# Patient Record
Sex: Male | Born: 1955
Health system: Southern US, Community
[De-identification: ages and names within clinical notes are randomized; demographics above are authoritative.]

## PROBLEM LIST (undated history)

## (undated) DIAGNOSIS — I255 Ischemic cardiomyopathy: Secondary | ICD-10-CM

## (undated) DIAGNOSIS — I2510 Atherosclerotic heart disease of native coronary artery without angina pectoris: Secondary | ICD-10-CM

## (undated) DIAGNOSIS — G971 Other reaction to spinal and lumbar puncture: Secondary | ICD-10-CM

## (undated) DIAGNOSIS — K219 Gastro-esophageal reflux disease without esophagitis: Secondary | ICD-10-CM

## (undated) DIAGNOSIS — E785 Hyperlipidemia, unspecified: Secondary | ICD-10-CM

## (undated) DIAGNOSIS — Z95810 Presence of automatic (implantable) cardiac defibrillator: Secondary | ICD-10-CM

## (undated) DIAGNOSIS — T827XXA Infection and inflammatory reaction due to other cardiac and vascular devices, implants and grafts, initial encounter: Secondary | ICD-10-CM

## (undated) DIAGNOSIS — Z9889 Other specified postprocedural states: Secondary | ICD-10-CM

## (undated) DIAGNOSIS — IMO0001 Reserved for inherently not codable concepts without codable children: Secondary | ICD-10-CM

## (undated) DIAGNOSIS — R112 Nausea with vomiting, unspecified: Secondary | ICD-10-CM

## (undated) DIAGNOSIS — F419 Anxiety disorder, unspecified: Secondary | ICD-10-CM

## (undated) DIAGNOSIS — R51 Headache: Secondary | ICD-10-CM

## (undated) DIAGNOSIS — I679 Cerebrovascular disease, unspecified: Secondary | ICD-10-CM

## (undated) DIAGNOSIS — I519 Heart disease, unspecified: Secondary | ICD-10-CM

## (undated) DIAGNOSIS — I739 Peripheral vascular disease, unspecified: Secondary | ICD-10-CM

## (undated) DIAGNOSIS — I701 Atherosclerosis of renal artery: Secondary | ICD-10-CM

## (undated) DIAGNOSIS — I10 Essential (primary) hypertension: Secondary | ICD-10-CM

## (undated) DIAGNOSIS — I447 Left bundle-branch block, unspecified: Secondary | ICD-10-CM

## (undated) DIAGNOSIS — I462 Cardiac arrest due to underlying cardiac condition: Secondary | ICD-10-CM

## (undated) HISTORY — DX: Cardiac arrest due to underlying cardiac condition: I46.2

## (undated) HISTORY — DX: Ischemic cardiomyopathy: I25.5

## (undated) HISTORY — DX: Heart disease, unspecified: I51.9

## (undated) HISTORY — DX: Cerebrovascular disease, unspecified: I67.9

## (undated) HISTORY — DX: Atherosclerosis of renal artery: I70.1

## (undated) HISTORY — DX: Essential (primary) hypertension: I10

## (undated) HISTORY — DX: Hyperlipidemia, unspecified: E78.5

## (undated) HISTORY — DX: Left bundle-branch block, unspecified: I44.7

## (undated) HISTORY — DX: Presence of automatic (implantable) cardiac defibrillator: Z95.810

## (undated) HISTORY — PX: PERCUTANEOUS PLACEMENT INTRAVASCULAR STENT CERVICAL CAROTID ARTERY: SUR1019

## (undated) HISTORY — DX: Atherosclerotic heart disease of native coronary artery without angina pectoris: I25.10

## (undated) HISTORY — DX: Infection and inflammatory reaction due to other cardiac and vascular devices, implants and grafts, initial encounter: T82.7XXA

## (undated) HISTORY — DX: Peripheral vascular disease, unspecified: I73.9

---

## 2000-01-04 ENCOUNTER — Encounter: Payer: Self-pay | Admitting: Emergency Medicine

## 2000-01-04 ENCOUNTER — Emergency Department (HOSPITAL_COMMUNITY): Admission: EM | Admit: 2000-01-04 | Discharge: 2000-01-04 | Payer: Self-pay | Admitting: Emergency Medicine

## 2004-12-11 ENCOUNTER — Ambulatory Visit: Payer: Self-pay | Admitting: Family Medicine

## 2004-12-25 ENCOUNTER — Ambulatory Visit: Payer: Self-pay | Admitting: Family Medicine

## 2005-01-09 ENCOUNTER — Ambulatory Visit: Payer: Self-pay | Admitting: Internal Medicine

## 2005-01-16 ENCOUNTER — Ambulatory Visit (HOSPITAL_COMMUNITY): Admission: RE | Admit: 2005-01-16 | Discharge: 2005-01-16 | Payer: Self-pay | Admitting: Internal Medicine

## 2005-01-16 ENCOUNTER — Ambulatory Visit: Payer: Self-pay | Admitting: Internal Medicine

## 2005-02-13 ENCOUNTER — Ambulatory Visit: Payer: Self-pay | Admitting: Family Medicine

## 2012-06-08 DIAGNOSIS — I2510 Atherosclerotic heart disease of native coronary artery without angina pectoris: Secondary | ICD-10-CM

## 2012-06-08 HISTORY — DX: Atherosclerotic heart disease of native coronary artery without angina pectoris: I25.10

## 2012-06-11 ENCOUNTER — Encounter (HOSPITAL_COMMUNITY): Payer: Self-pay | Admitting: Family Medicine

## 2012-06-11 ENCOUNTER — Emergency Department (HOSPITAL_COMMUNITY): Payer: BC Managed Care – PPO | Admitting: Anesthesiology

## 2012-06-11 ENCOUNTER — Inpatient Hospital Stay (HOSPITAL_COMMUNITY)
Admission: EM | Admit: 2012-06-11 | Discharge: 2012-06-18 | DRG: 546 | Disposition: A | Discharge status: Home / Self Care | Payer: BC Managed Care – PPO | Attending: Cardiothoracic Surgery | Admitting: Cardiothoracic Surgery

## 2012-06-11 ENCOUNTER — Emergency Department (HOSPITAL_COMMUNITY): Payer: BC Managed Care – PPO

## 2012-06-11 ENCOUNTER — Inpatient Hospital Stay (HOSPITAL_COMMUNITY): Payer: BC Managed Care – PPO

## 2012-06-11 ENCOUNTER — Encounter (HOSPITAL_COMMUNITY)
Admission: EM | Disposition: A | Discharge status: Home / Self Care | Payer: Self-pay | Source: Home / Self Care | Attending: Cardiothoracic Surgery

## 2012-06-11 ENCOUNTER — Encounter (HOSPITAL_COMMUNITY): Payer: Self-pay | Admitting: Anesthesiology

## 2012-06-11 DIAGNOSIS — I213 ST elevation (STEMI) myocardial infarction of unspecified site: Secondary | ICD-10-CM | POA: Diagnosis present

## 2012-06-11 DIAGNOSIS — I2510 Atherosclerotic heart disease of native coronary artery without angina pectoris: Secondary | ICD-10-CM | POA: Diagnosis present

## 2012-06-11 DIAGNOSIS — I708 Atherosclerosis of other arteries: Secondary | ICD-10-CM | POA: Diagnosis present

## 2012-06-11 DIAGNOSIS — I2584 Coronary atherosclerosis due to calcified coronary lesion: Secondary | ICD-10-CM | POA: Diagnosis present

## 2012-06-11 DIAGNOSIS — I469 Cardiac arrest, cause unspecified: Secondary | ICD-10-CM | POA: Diagnosis present

## 2012-06-11 DIAGNOSIS — E872 Acidosis, unspecified: Secondary | ICD-10-CM | POA: Diagnosis not present

## 2012-06-11 DIAGNOSIS — I454 Nonspecific intraventricular block: Secondary | ICD-10-CM | POA: Diagnosis present

## 2012-06-11 DIAGNOSIS — Z23 Encounter for immunization: Secondary | ICD-10-CM

## 2012-06-11 DIAGNOSIS — I739 Peripheral vascular disease, unspecified: Secondary | ICD-10-CM | POA: Diagnosis present

## 2012-06-11 DIAGNOSIS — D62 Acute posthemorrhagic anemia: Secondary | ICD-10-CM | POA: Diagnosis not present

## 2012-06-11 DIAGNOSIS — I4901 Ventricular fibrillation: Secondary | ICD-10-CM | POA: Diagnosis present

## 2012-06-11 DIAGNOSIS — Z8674 Personal history of sudden cardiac arrest: Secondary | ICD-10-CM | POA: Diagnosis present

## 2012-06-11 DIAGNOSIS — I701 Atherosclerosis of renal artery: Secondary | ICD-10-CM | POA: Diagnosis present

## 2012-06-11 DIAGNOSIS — R570 Cardiogenic shock: Secondary | ICD-10-CM | POA: Diagnosis present

## 2012-06-11 DIAGNOSIS — E876 Hypokalemia: Secondary | ICD-10-CM | POA: Diagnosis not present

## 2012-06-11 DIAGNOSIS — I2582 Chronic total occlusion of coronary artery: Secondary | ICD-10-CM | POA: Diagnosis present

## 2012-06-11 DIAGNOSIS — Z951 Presence of aortocoronary bypass graft: Secondary | ICD-10-CM

## 2012-06-11 DIAGNOSIS — I4949 Other premature depolarization: Secondary | ICD-10-CM | POA: Diagnosis present

## 2012-06-11 DIAGNOSIS — I219 Acute myocardial infarction, unspecified: Principal | ICD-10-CM | POA: Diagnosis present

## 2012-06-11 DIAGNOSIS — E785 Hyperlipidemia, unspecified: Secondary | ICD-10-CM | POA: Diagnosis present

## 2012-06-11 DIAGNOSIS — I255 Ischemic cardiomyopathy: Secondary | ICD-10-CM | POA: Diagnosis present

## 2012-06-11 DIAGNOSIS — F17200 Nicotine dependence, unspecified, uncomplicated: Secondary | ICD-10-CM | POA: Diagnosis present

## 2012-06-11 DIAGNOSIS — I2589 Other forms of chronic ischemic heart disease: Secondary | ICD-10-CM | POA: Diagnosis present

## 2012-06-11 DIAGNOSIS — Z720 Tobacco use: Secondary | ICD-10-CM | POA: Diagnosis present

## 2012-06-11 DIAGNOSIS — E8779 Other fluid overload: Secondary | ICD-10-CM | POA: Diagnosis not present

## 2012-06-11 HISTORY — PX: CORONARY ARTERY BYPASS GRAFT: SHX141

## 2012-06-11 HISTORY — PX: LEFT HEART CATHETERIZATION WITH CORONARY ANGIOGRAM: SHX5451

## 2012-06-11 LAB — POCT I-STAT 4, (NA,K, GLUC, HGB,HCT)
Glucose, Bld: 139 mg/dL — ABNORMAL HIGH (ref 70–99)
Glucose, Bld: 125 mg/dL — ABNORMAL HIGH (ref 70–99)
HCT: 40 % (ref 39.0–52.0)
HCT: 26 % — ABNORMAL LOW (ref 39.0–52.0)
HCT: 32 % — ABNORMAL LOW (ref 39.0–52.0)
Hemoglobin: 13.6 g/dL (ref 13.0–17.0)
Hemoglobin: 8.8 g/dL — ABNORMAL LOW (ref 13.0–17.0)
Hemoglobin: 8.8 g/dL — ABNORMAL LOW (ref 13.0–17.0)
Potassium: 4 mEq/L (ref 3.5–5.1)
Potassium: 4.2 mEq/L (ref 3.5–5.1)
Potassium: 3.6 mEq/L (ref 3.5–5.1)
Potassium: 3.6 mEq/L (ref 3.5–5.1)
Sodium: 143 mEq/L (ref 135–145)
Sodium: 145 mEq/L (ref 135–145)
Sodium: 141 mEq/L (ref 135–145)
Sodium: 141 mEq/L (ref 135–145)

## 2012-06-11 LAB — POCT I-STAT TROPONIN I

## 2012-06-11 LAB — HEMOGLOBIN AND HEMATOCRIT, BLOOD
HCT: 25.8 % — ABNORMAL LOW (ref 39.0–52.0)
Hemoglobin: 9.5 g/dL — ABNORMAL LOW (ref 13.0–17.0)

## 2012-06-11 LAB — COMPREHENSIVE METABOLIC PANEL
ALT: 98 U/L — ABNORMAL HIGH (ref 0–53)
AST: 102 U/L — ABNORMAL HIGH (ref 0–37)
Alkaline Phosphatase: 90 U/L (ref 39–117)
CO2: 12 mEq/L — ABNORMAL LOW (ref 19–32)
GFR calc Af Amer: >90 mL/min (ref >90)
GFR calc non Af Amer: 78 mL/min — ABNORMAL LOW (ref >90)
Glucose, Bld: 374 mg/dL — ABNORMAL HIGH (ref 70–99)
Potassium: 3.3 mEq/L — ABNORMAL LOW (ref 3.5–5.1)
Sodium: 139 mEq/L (ref 135–145)

## 2012-06-11 LAB — CBC
Hemoglobin: 14.3 g/dL (ref 13.0–17.0)
Platelets: 244 10*3/uL (ref 150–400)
Platelets: 232 10*3/uL (ref 150–400)
RBC: 4.74 MIL/uL (ref 4.22–5.81)
RBC: 3.57 MIL/uL — ABNORMAL LOW (ref 4.22–5.81)
RDW: 12.5 % (ref 11.5–15.5)
WBC: 13.4 10*3/uL — ABNORMAL HIGH (ref 4.0–10.5)
WBC: 25.2 10*3/uL — ABNORMAL HIGH (ref 4.0–10.5)

## 2012-06-11 LAB — POCT I-STAT 3, ART BLOOD GAS (G3+)
Acid-base deficit: 4 mmol/L — ABNORMAL HIGH (ref 0.0–2.0)
Acid-base deficit: 1 mmol/L (ref 0.0–2.0)
Acid-base deficit: 2 mmol/L (ref 0.0–2.0)
Acid-base deficit: 2 mmol/L (ref 0.0–2.0)
Acid-base deficit: 1 mmol/L (ref 0.0–2.0)
Bicarbonate: 24.9 mEq/L — ABNORMAL HIGH (ref 20.0–24.0)
Bicarbonate: 25.9 mEq/L — ABNORMAL HIGH (ref 20.0–24.0)
Bicarbonate: 25.2 mEq/L — ABNORMAL HIGH (ref 20.0–24.0)
Bicarbonate: 25.4 mEq/L — ABNORMAL HIGH (ref 20.0–24.0)
O2 Saturation: 99 %
O2 Saturation: 100 %
O2 Saturation: 68 %
O2 Saturation: 95 %
O2 Saturation: 95 %
Patient temperature: 35.1
Patient temperature: 36.5
TCO2: 16 mmol/L (ref 0–100)
TCO2: 27 mmol/L (ref 0–100)
TCO2: 27 mmol/L (ref 0–100)
pCO2 arterial: 55.9 mmHg — ABNORMAL HIGH (ref 35.0–45.0)
pCO2 arterial: 52.2 mmHg — ABNORMAL HIGH (ref 35.0–45.0)
pH, Arterial: 7.344 — ABNORMAL LOW (ref 7.350–7.450)
pH, Arterial: 7.291 — ABNORMAL LOW (ref 7.350–7.450)
pO2, Arterial: 115 mmHg — ABNORMAL HIGH (ref 80.0–100.0)
pO2, Arterial: 332 mmHg — ABNORMAL HIGH (ref 80.0–100.0)
pO2, Arterial: 76 mmHg — ABNORMAL LOW (ref 80.0–100.0)

## 2012-06-11 LAB — POCT I-STAT, CHEM 8
BUN: 9 mg/dL (ref 6–23)
Creatinine, Ser: 1.2 mg/dL (ref 0.50–1.35)
Glucose, Bld: 356 mg/dL — ABNORMAL HIGH (ref 70–99)
Hemoglobin: 15 g/dL (ref 13.0–17.0)
Potassium: 3.3 mEq/L — ABNORMAL LOW (ref 3.5–5.1)
Sodium: 141 mEq/L (ref 135–145)

## 2012-06-11 LAB — ABO/RH: ABO/RH(D): O POS

## 2012-06-11 LAB — PROTIME-INR
INR: 1.53 — ABNORMAL HIGH (ref 0.00–1.49)
Prothrombin Time: 18.7 seconds — ABNORMAL HIGH (ref 11.6–15.2)

## 2012-06-11 LAB — MRSA PCR SCREENING: MRSA by PCR: NEGATIVE

## 2012-06-11 LAB — APTT
aPTT: 51 seconds — ABNORMAL HIGH (ref 24–37)
aPTT: 40 seconds — ABNORMAL HIGH (ref 24–37)

## 2012-06-11 LAB — PLATELET COUNT: Platelets: 168 10*3/uL (ref 150–400)

## 2012-06-11 SURGERY — LEFT HEART CATHETERIZATION WITH CORONARY ANGIOGRAM
Anesthesia: LOCAL

## 2012-06-11 SURGERY — CORONARY ARTERY BYPASS GRAFTING (CABG)
Anesthesia: General | Site: Chest | Wound class: Clean

## 2012-06-11 MED ORDER — PHENYLEPHRINE HCL 10 MG/ML IJ SOLN
0.0000 ug/min | INTRAVENOUS | Status: DC
  Filled 2012-06-11 (×4): qty 2

## 2012-06-11 MED ORDER — CHLORHEXIDINE GLUCONATE 0.12 % MT SOLN
OROMUCOSAL | Status: AC
  Administered 2012-06-11: 15 mL
  Filled 2012-06-11: qty 15

## 2012-06-11 MED ORDER — LORAZEPAM 2 MG/ML IJ SOLN
2.0000 mg | Freq: Once | INTRAMUSCULAR | Status: AC
  Administered 2012-06-11: 2 mg via INTRAVENOUS

## 2012-06-11 MED ORDER — MIDAZOLAM HCL 2 MG/2ML IJ SOLN
INTRAMUSCULAR | Status: AC
  Filled 2012-06-11: qty 2

## 2012-06-11 MED ORDER — 0.9 % SODIUM CHLORIDE (POUR BTL) OPTIME
TOPICAL | Status: DC | PRN
  Administered 2012-06-11: 1000 mL
  Administered 2012-06-11: 4000 mL

## 2012-06-11 MED ORDER — SODIUM CHLORIDE 0.9 % IV SOLN
100.0000 [IU] | INTRAVENOUS | Status: DC | PRN
  Administered 2012-06-11: 1 [IU]/h via INTRAVENOUS

## 2012-06-11 MED ORDER — DEXMEDETOMIDINE HCL IN NACL 400 MCG/100ML IV SOLN
0.1000 ug/kg/h | INTRAVENOUS | Status: DC
  Filled 2012-06-11: qty 100

## 2012-06-11 MED ORDER — LIDOCAINE IN D5W 4-5 MG/ML-% IV SOLN
1.0000 mg/min | INTRAVENOUS | Status: DC
  Filled 2012-06-11 (×2): qty 500

## 2012-06-11 MED ORDER — HEMOSTATIC AGENTS (NO CHARGE) OPTIME
TOPICAL | Status: DC | PRN
  Administered 2012-06-11: 1 via TOPICAL

## 2012-06-11 MED ORDER — CEFUROXIME SODIUM 1.5 G IJ SOLR
1.5000 g | INTRAMUSCULAR | Status: DC
  Filled 2012-06-11: qty 1.5

## 2012-06-11 MED ORDER — BISACODYL 10 MG RE SUPP: 10.0000 mg | Freq: Every day | RECTAL | Status: DC

## 2012-06-11 MED ORDER — SODIUM CHLORIDE 0.45 % IV SOLN
INTRAVENOUS | Status: DC
  Administered 2012-06-13: 02:00:00 via INTRAVENOUS

## 2012-06-11 MED ORDER — MORPHINE SULFATE 2 MG/ML IJ SOLN
2.0000 mg | INTRAMUSCULAR | Status: DC | PRN
  Administered 2012-06-11: 2 mg via INTRAVENOUS
  Administered 2012-06-12: 4 mg via INTRAVENOUS
  Administered 2012-06-12 (×2): 2 mg via INTRAVENOUS
  Administered 2012-06-12: 4 mg via INTRAVENOUS
  Administered 2012-06-12 (×2): 2 mg via INTRAVENOUS
  Administered 2012-06-12: 4 mg via INTRAVENOUS
  Administered 2012-06-12 – 2012-06-13 (×4): 2 mg via INTRAVENOUS
  Administered 2012-06-13: 4 mg via INTRAVENOUS
  Administered 2012-06-13 – 2012-06-14 (×5): 2 mg via INTRAVENOUS
  Administered 2012-06-14 (×2): 4 mg via INTRAVENOUS
  Filled 2012-06-11: qty 2
  Filled 2012-06-11 (×4): qty 1
  Filled 2012-06-11: qty 2
  Filled 2012-06-11: qty 1
  Filled 2012-06-11: qty 2
  Filled 2012-06-11 (×3): qty 1
  Filled 2012-06-11 (×2): qty 2
  Filled 2012-06-11 (×3): qty 1
  Filled 2012-06-11 (×2): qty 2
  Filled 2012-06-11: qty 1

## 2012-06-11 MED ORDER — ACETAMINOPHEN 160 MG/5ML PO SOLN
650.0000 mg | ORAL | Status: AC
  Administered 2012-06-11: 650 mg

## 2012-06-11 MED ORDER — SODIUM CHLORIDE 0.9 % IV SOLN
INTRAVENOUS | Status: DC
  Filled 2012-06-11: qty 1

## 2012-06-11 MED ORDER — DEXMEDETOMIDINE HCL IN NACL 200 MCG/50ML IV SOLN
0.1000 ug/kg/h | INTRAVENOUS | Status: DC
  Administered 2012-06-12 – 2012-06-13 (×6): 0.7 ug/kg/h via INTRAVENOUS
  Filled 2012-06-11 (×6): qty 50

## 2012-06-11 MED ORDER — MORPHINE SULFATE 2 MG/ML IJ SOLN
1.0000 mg | INTRAMUSCULAR | Status: AC | PRN
  Filled 2012-06-11 (×3): qty 1

## 2012-06-11 MED ORDER — BUDESONIDE 0.5 MG/2ML IN SUSP
0.5000 mg | Freq: Two times a day (BID) | RESPIRATORY_TRACT | Status: DC
  Administered 2012-06-11 – 2012-06-18 (×14): 0.5 mg via RESPIRATORY_TRACT
  Filled 2012-06-11 (×18): qty 2

## 2012-06-11 MED ORDER — LACTATED RINGERS IV SOLN: 500.0000 mL | Freq: Once | INTRAVENOUS | Status: AC | PRN

## 2012-06-11 MED ORDER — ALBUMIN HUMAN 5 % IV SOLN
INTRAVENOUS | Status: DC | PRN
  Administered 2012-06-11: 20:00:00 via INTRAVENOUS

## 2012-06-11 MED ORDER — METOPROLOL TARTRATE 25 MG/10 ML ORAL SUSPENSION
12.5000 mg | Freq: Two times a day (BID) | ORAL | Status: DC
  Filled 2012-06-11 (×7): qty 5

## 2012-06-11 MED ORDER — DOPAMINE-DEXTROSE 3.2-5 MG/ML-% IV SOLN
2.0000 ug/kg/min | INTRAVENOUS | Status: DC
  Filled 2012-06-11: qty 250

## 2012-06-11 MED ORDER — FENTANYL CITRATE 0.05 MG/ML IJ SOLN
INTRAMUSCULAR | Status: AC
  Filled 2012-06-11: qty 2

## 2012-06-11 MED ORDER — ACETAMINOPHEN 500 MG PO TABS
1000.0000 mg | ORAL_TABLET | Freq: Four times a day (QID) | ORAL | Status: AC
  Administered 2012-06-13 – 2012-06-16 (×11): 1000 mg via ORAL
  Filled 2012-06-11 (×18): qty 2

## 2012-06-11 MED ORDER — LIDOCAINE IN D5W 4-5 MG/ML-% IV SOLN
INTRAVENOUS | Status: DC | PRN
  Administered 2012-06-11: 2 mg/min via INTRAVENOUS

## 2012-06-11 MED ORDER — LEVALBUTEROL HCL 0.63 MG/3ML IN NEBU
0.6300 mg | INHALATION_SOLUTION | Freq: Four times a day (QID) | RESPIRATORY_TRACT | Status: DC
  Administered 2012-06-11 – 2012-06-16 (×21): 0.63 mg via RESPIRATORY_TRACT
  Filled 2012-06-11 (×24): qty 3

## 2012-06-11 MED ORDER — CALCIUM CHLORIDE 10 % IV SOLN
INTRAVENOUS | Status: DC | PRN
  Administered 2012-06-11 (×3): 300 mg via INTRAVENOUS

## 2012-06-11 MED ORDER — DOCUSATE SODIUM 100 MG PO CAPS
200.0000 mg | ORAL_CAPSULE | Freq: Every day | ORAL | Status: DC
  Administered 2012-06-13 – 2012-06-15 (×3): 200 mg via ORAL
  Filled 2012-06-11 (×3): qty 2

## 2012-06-11 MED ORDER — POTASSIUM CHLORIDE 2 MEQ/ML IV SOLN
80.0000 meq | INTRAVENOUS | Status: DC
  Filled 2012-06-11: qty 40

## 2012-06-11 MED ORDER — ONDANSETRON HCL 4 MG/2ML IJ SOLN
4.0000 mg | Freq: Four times a day (QID) | INTRAMUSCULAR | Status: DC | PRN
  Administered 2012-06-13: 4 mg via INTRAVENOUS
  Filled 2012-06-11: qty 2

## 2012-06-11 MED ORDER — MORPHINE SULFATE 2 MG/ML IJ SOLN: 1.0000 mg | INTRAMUSCULAR | Status: DC | PRN

## 2012-06-11 MED ORDER — PHENYLEPHRINE HCL 10 MG/ML IJ SOLN
20.0000 mg | INTRAVENOUS | Status: DC | PRN
  Administered 2012-06-11: 13.3 ug/min via INTRAVENOUS

## 2012-06-11 MED ORDER — POTASSIUM CHLORIDE 10 MEQ/50ML IV SOLN
10.0000 meq | INTRAVENOUS | Status: AC
  Administered 2012-06-11 – 2012-06-12 (×3): 10 meq via INTRAVENOUS

## 2012-06-11 MED ORDER — SODIUM CHLORIDE 0.9 % IV SOLN
200.0000 ug | INTRAVENOUS | Status: DC | PRN
  Administered 2012-06-11: .2 ug via INTRAVENOUS

## 2012-06-11 MED ORDER — NITROGLYCERIN IN D5W 200-5 MCG/ML-% IV SOLN: 0.0000 ug/min | INTRAVENOUS | Status: DC

## 2012-06-11 MED ORDER — PHENYLEPHRINE HCL 10 MG/ML IJ SOLN
INTRAMUSCULAR | Status: DC | PRN
  Administered 2012-06-11 (×4): 80 ug via INTRAVENOUS

## 2012-06-11 MED ORDER — NITROGLYCERIN IN D5W 200-5 MCG/ML-% IV SOLN
2.0000 ug/min | INTRAVENOUS | Status: DC
  Filled 2012-06-11: qty 250

## 2012-06-11 MED ORDER — SODIUM CHLORIDE 0.9 % IJ SOLN
OROMUCOSAL | Status: DC | PRN
  Administered 2012-06-11 (×3): via TOPICAL

## 2012-06-11 MED ORDER — TRANEXAMIC ACID 100 MG/ML IV SOLN
2500.0000 mg | INTRAVENOUS | Status: DC | PRN
  Administered 2012-06-11: 1.5 mg/kg/h via INTRAVENOUS

## 2012-06-11 MED ORDER — MIDAZOLAM HCL 5 MG/5ML IJ SOLN
INTRAMUSCULAR | Status: DC | PRN
  Administered 2012-06-11 (×2): 5 mg via INTRAVENOUS

## 2012-06-11 MED ORDER — TRANEXAMIC ACID (OHS) BOLUS VIA INFUSION
15.0000 mg/kg | INTRAVENOUS | Status: DC
  Filled 2012-06-11: qty 1125

## 2012-06-11 MED ORDER — PHENYLEPHRINE HCL 10 MG/ML IJ SOLN
30.0000 ug/min | INTRAVENOUS | Status: DC
  Filled 2012-06-11: qty 2

## 2012-06-11 MED ORDER — FENTANYL CITRATE 0.05 MG/ML IJ SOLN
INTRAMUSCULAR | Status: DC | PRN
  Administered 2012-06-11: 350 ug via INTRAVENOUS
  Administered 2012-06-11: 250 ug via INTRAVENOUS
  Administered 2012-06-11: 150 ug via INTRAVENOUS
  Administered 2012-06-11: 250 ug via INTRAVENOUS

## 2012-06-11 MED ORDER — SODIUM BICARBONATE 8.4 % IV SOLN
INTRAVENOUS | Status: AC
  Filled 2012-06-11: qty 50

## 2012-06-11 MED ORDER — LACTATED RINGERS IV SOLN
INTRAVENOUS | Status: DC | PRN
  Administered 2012-06-11 (×2): via INTRAVENOUS

## 2012-06-11 MED ORDER — EPINEPHRINE HCL 1 MG/ML IJ SOLN
0.5000 ug/min | INTRAVENOUS | Status: DC
  Filled 2012-06-11: qty 4

## 2012-06-11 MED ORDER — DOPAMINE-DEXTROSE 3.2-5 MG/ML-% IV SOLN
0.0000 ug/kg/min | INTRAVENOUS | Status: DC
  Administered 2012-06-13: 4 ug/kg/min via INTRAVENOUS
  Filled 2012-06-11: qty 250

## 2012-06-11 MED ORDER — LEVALBUTEROL HCL 1.25 MG/0.5ML IN NEBU: 1.2500 mg | INHALATION_SOLUTION | Freq: Three times a day (TID) | RESPIRATORY_TRACT | Status: DC

## 2012-06-11 MED ORDER — MIDAZOLAM HCL 2 MG/2ML IJ SOLN
2.0000 mg | INTRAMUSCULAR | Status: DC | PRN
  Administered 2012-06-11 – 2012-06-12 (×12): 2 mg via INTRAVENOUS
  Filled 2012-06-11 (×12): qty 2

## 2012-06-11 MED ORDER — NOREPINEPHRINE BITARTRATE 1 MG/ML IJ SOLN
2.0000 ug/min | INTRAMUSCULAR | Status: DC
  Administered 2012-06-11: 1 ug/min via INTRAVENOUS
  Administered 2012-06-12: 8 ug/min via INTRAVENOUS
  Administered 2012-06-13: 6 ug/min via INTRAVENOUS
  Filled 2012-06-11 (×4): qty 4

## 2012-06-11 MED ORDER — DEXTROSE 5 % IV SOLN
1.5000 g | INTRAVENOUS | Status: DC | PRN
  Administered 2012-06-11: 1.5 g via INTRAVENOUS

## 2012-06-11 MED ORDER — MILRINONE IN DEXTROSE 200-5 MCG/ML-% IV SOLN
INTRAVENOUS | Status: DC | PRN
  Administered 2012-06-11: .33 ug/kg/min via INTRAVENOUS

## 2012-06-11 MED ORDER — FAMOTIDINE IN NACL 20-0.9 MG/50ML-% IV SOLN
20.0000 mg | Freq: Two times a day (BID) | INTRAVENOUS | Status: AC
  Administered 2012-06-11 – 2012-06-12 (×2): 20 mg via INTRAVENOUS
  Filled 2012-06-11: qty 50

## 2012-06-11 MED ORDER — LIDOCAINE HCL (PF) 1 % IJ SOLN
INTRAMUSCULAR | Status: AC
  Filled 2012-06-11: qty 30

## 2012-06-11 MED ORDER — LIDOCAINE IN D5W 4-5 MG/ML-% IV SOLN: 1.0000 mg/min | INTRAVENOUS | Status: DC

## 2012-06-11 MED ORDER — PROTAMINE SULFATE 10 MG/ML IV SOLN
INTRAVENOUS | Status: DC | PRN
  Administered 2012-06-11: 10 mg via INTRAVENOUS
  Administered 2012-06-11: 50 mg via INTRAVENOUS
  Administered 2012-06-11: 240 mg via INTRAVENOUS

## 2012-06-11 MED ORDER — SODIUM CHLORIDE 0.9 % IJ SOLN
3.0000 mL | Freq: Two times a day (BID) | INTRAMUSCULAR | Status: DC
  Administered 2012-06-12 – 2012-06-17 (×10): 3 mL via INTRAVENOUS

## 2012-06-11 MED ORDER — CEFUROXIME SODIUM 1.5 G IJ SOLR
1.5000 g | Freq: Two times a day (BID) | INTRAMUSCULAR | Status: AC
  Administered 2012-06-12 – 2012-06-13 (×4): 1.5 g via INTRAVENOUS
  Filled 2012-06-11 (×4): qty 1.5

## 2012-06-11 MED ORDER — METOPROLOL TARTRATE 12.5 MG HALF TABLET
12.5000 mg | ORAL_TABLET | Freq: Two times a day (BID) | ORAL | Status: DC
  Administered 2012-06-13 – 2012-06-14 (×3): 12.5 mg via ORAL
  Filled 2012-06-11 (×7): qty 1

## 2012-06-11 MED ORDER — VANCOMYCIN HCL 1000 MG IV SOLR
1250.0000 mg | INTRAVENOUS | Status: DC | PRN
  Administered 2012-06-11: 1250 mg via INTRAVENOUS

## 2012-06-11 MED ORDER — SODIUM CHLORIDE 0.9 % IV SOLN
250.0000 mL | INTRAVENOUS | Status: DC
  Administered 2012-06-12: 250 mL via INTRAVENOUS

## 2012-06-11 MED ORDER — SODIUM CHLORIDE 0.9 % IV SOLN
INTRAVENOUS | Status: DC
  Administered 2012-06-12: 4 [IU]/h via INTRAVENOUS
  Administered 2012-06-12: 4.1 [IU]/h via INTRAVENOUS
  Filled 2012-06-11: qty 1

## 2012-06-11 MED ORDER — ASPIRIN EC 325 MG PO TBEC
325.0000 mg | DELAYED_RELEASE_TABLET | Freq: Every day | ORAL | Status: DC
  Administered 2012-06-13 – 2012-06-18 (×7): 325 mg via ORAL
  Filled 2012-06-11 (×7): qty 1

## 2012-06-11 MED ORDER — ACETAMINOPHEN 160 MG/5ML PO SOLN
975.0000 mg | Freq: Four times a day (QID) | ORAL | Status: DC
  Administered 2012-06-12 – 2012-06-13 (×4): 975 mg
  Filled 2012-06-11 (×4): qty 40.6

## 2012-06-11 MED ORDER — CALCIUM CHLORIDE 10 % IV SOLN
INTRAVENOUS | Status: AC
  Filled 2012-06-11: qty 10

## 2012-06-11 MED ORDER — ALBUMIN HUMAN 5 % IV SOLN
250.0000 mL | INTRAVENOUS | Status: AC | PRN
  Administered 2012-06-11 – 2012-06-12 (×4): 250 mL via INTRAVENOUS
  Filled 2012-06-11 (×2): qty 250

## 2012-06-11 MED ORDER — POTASSIUM CHLORIDE 10 MEQ/100ML IV SOLN
INTRAVENOUS | Status: AC
  Filled 2012-06-11: qty 100

## 2012-06-11 MED ORDER — EPINEPHRINE HCL 0.1 MG/ML IJ SOLN
INTRAMUSCULAR | Status: AC
  Filled 2012-06-11: qty 30

## 2012-06-11 MED ORDER — SODIUM CHLORIDE 0.9 % IV SOLN
INTRAVENOUS | Status: DC
  Administered 2012-06-11: 10 mL/h via INTRAVENOUS

## 2012-06-11 MED ORDER — FUROSEMIDE 10 MG/ML IJ SOLN
INTRAMUSCULAR | Status: DC | PRN
  Administered 2012-06-11: 10 mg via INTRAMUSCULAR

## 2012-06-11 MED ORDER — SODIUM CHLORIDE 0.9 % IJ SOLN
3.0000 mL | INTRAMUSCULAR | Status: DC | PRN
  Administered 2012-06-13 – 2012-06-17 (×3): 3 mL via INTRAVENOUS

## 2012-06-11 MED ORDER — TRANEXAMIC ACID (OHS) PUMP PRIME SOLUTION
2.0000 mg/kg | INTRAVENOUS | Status: DC
  Filled 2012-06-11: qty 1.5

## 2012-06-11 MED ORDER — LACTATED RINGERS IV SOLN
INTRAVENOUS | Status: DC | PRN
Start: 2012-06-11 — End: 2012-06-11
  Administered 2012-06-11: 15:00:00 via INTRAVENOUS

## 2012-06-11 MED ORDER — ACETAMINOPHEN 650 MG RE SUPP: 650.0000 mg | RECTAL | Status: AC

## 2012-06-11 MED ORDER — ASPIRIN 300 MG RE SUPP
300.0000 mg | Freq: Once | RECTAL | Status: AC
  Administered 2012-06-11: 300 mg via RECTAL

## 2012-06-11 MED ORDER — LACTATED RINGERS IV SOLN: INTRAVENOUS | Status: DC

## 2012-06-11 MED ORDER — DEXTROSE 5 % IV SOLN
750.0000 mg | INTRAVENOUS | Status: DC
  Filled 2012-06-11: qty 750

## 2012-06-11 MED ORDER — ASPIRIN 81 MG PO CHEW
324.0000 mg | CHEWABLE_TABLET | Freq: Every day | ORAL | Status: DC
  Administered 2012-06-12: 324 mg
  Filled 2012-06-11: qty 4

## 2012-06-11 MED ORDER — MIDAZOLAM HCL 2 MG/2ML IJ SOLN
INTRAMUSCULAR | Status: AC
  Filled 2012-06-11: qty 4

## 2012-06-11 MED ORDER — DOPAMINE-DEXTROSE 3.2-5 MG/ML-% IV SOLN
INTRAVENOUS | Status: DC | PRN
  Administered 2012-06-11: 3 ug/kg/min via INTRAVENOUS

## 2012-06-11 MED ORDER — PANTOPRAZOLE SODIUM 40 MG PO TBEC
40.0000 mg | DELAYED_RELEASE_TABLET | Freq: Every day | ORAL | Status: DC
  Administered 2012-06-13 – 2012-06-18 (×6): 40 mg via ORAL
  Filled 2012-06-11 (×6): qty 1

## 2012-06-11 MED ORDER — POTASSIUM CHLORIDE 10 MEQ/50ML IV SOLN
10.0000 meq | Freq: Once | INTRAVENOUS | Status: AC
  Administered 2012-06-12: 10 meq via INTRAVENOUS

## 2012-06-11 MED ORDER — LIDOCAINE IN D5W 4-5 MG/ML-% IV SOLN
1.0000 mg/min | INTRAVENOUS | Status: DC
  Filled 2012-06-11 (×2): qty 250

## 2012-06-11 MED ORDER — NITROGLYCERIN 0.2 MG/ML ON CALL CATH LAB
INTRAVENOUS | Status: AC
  Filled 2012-06-11: qty 1

## 2012-06-11 MED ORDER — SODIUM BICARBONATE 8.4 % IV SOLN
INTRAVENOUS | Status: DC | PRN
  Administered 2012-06-11: 16:00:00

## 2012-06-11 MED ORDER — PROPOFOL 10 MG/ML IV EMUL: 5.0000 ug/kg/min | Freq: Once | INTRAVENOUS | Status: DC

## 2012-06-11 MED ORDER — LORAZEPAM 2 MG/ML IJ SOLN
INTRAMUSCULAR | Status: AC
  Filled 2012-06-11: qty 2

## 2012-06-11 MED ORDER — VANCOMYCIN HCL IN DEXTROSE 1-5 GM/200ML-% IV SOLN
1000.0000 mg | Freq: Two times a day (BID) | INTRAVENOUS | Status: AC
  Administered 2012-06-12 – 2012-06-13 (×4): 1000 mg via INTRAVENOUS
  Filled 2012-06-11 (×4): qty 200

## 2012-06-11 MED ORDER — BISACODYL 5 MG PO TBEC
10.0000 mg | DELAYED_RELEASE_TABLET | Freq: Every day | ORAL | Status: DC
  Administered 2012-06-13 – 2012-06-15 (×3): 10 mg via ORAL
  Filled 2012-06-11 (×3): qty 2

## 2012-06-11 MED ORDER — SODIUM CHLORIDE 0.9 % IV SOLN: INTRAVENOUS | Status: DC

## 2012-06-11 MED ORDER — OXYCODONE HCL 5 MG PO TABS
5.0000 mg | ORAL_TABLET | ORAL | Status: DC | PRN
  Administered 2012-06-13: 5 mg via ORAL
  Administered 2012-06-13 – 2012-06-17 (×9): 10 mg via ORAL
  Filled 2012-06-11 (×6): qty 2
  Filled 2012-06-11: qty 1
  Filled 2012-06-11 (×4): qty 2

## 2012-06-11 MED ORDER — NITROGLYCERIN IN D5W 200-5 MCG/ML-% IV SOLN
INTRAVENOUS | Status: DC | PRN
  Administered 2012-06-11: 10 ug/min via INTRAVENOUS

## 2012-06-11 MED ORDER — TRANEXAMIC ACID 100 MG/ML IV SOLN
1.5000 mg/kg/h | INTRAVENOUS | Status: DC
  Filled 2012-06-11: qty 25

## 2012-06-11 MED ORDER — ROCURONIUM BROMIDE 100 MG/10ML IV SOLN
INTRAVENOUS | Status: DC | PRN
  Administered 2012-06-11 (×3): 50 mg via INTRAVENOUS

## 2012-06-11 MED ORDER — PLASMA-LYTE 148 IV SOLN
INTRAVENOUS | Status: DC
  Filled 2012-06-11: qty 2.5

## 2012-06-11 MED ORDER — METOPROLOL TARTRATE 1 MG/ML IV SOLN
2.5000 mg | INTRAVENOUS | Status: DC | PRN
  Administered 2012-06-13: 5 mg via INTRAVENOUS

## 2012-06-11 MED ORDER — HEPARIN (PORCINE) IN NACL 2-0.9 UNIT/ML-% IJ SOLN
INTRAMUSCULAR | Status: AC
  Filled 2012-06-11: qty 2000

## 2012-06-11 MED ORDER — LACTATED RINGERS IV SOLN
INTRAVENOUS | Status: DC | PRN
  Administered 2012-06-11: 15:00:00 via INTRAVENOUS

## 2012-06-11 MED ORDER — VANCOMYCIN HCL 1000 MG IV SOLR
1250.0000 mg | INTRAVENOUS | Status: DC
  Filled 2012-06-11: qty 1250

## 2012-06-11 MED ORDER — MILRINONE IN DEXTROSE 200-5 MCG/ML-% IV SOLN
0.2000 ug/kg/min | INTRAVENOUS | Status: DC
  Administered 2012-06-12: 0.3 ug/kg/min via INTRAVENOUS
  Administered 2012-06-12 – 2012-06-13 (×2): 0.2 ug/kg/min via INTRAVENOUS
  Filled 2012-06-11 (×4): qty 100

## 2012-06-11 MED ORDER — LIDOCAINE HCL (CARDIAC) 20 MG/ML IV SOLN
INTRAVENOUS | Status: AC
  Filled 2012-06-11: qty 5

## 2012-06-11 MED ORDER — LIDOCAINE HCL (CARDIAC) 20 MG/ML IV SOLN
INTRAVENOUS | Status: DC | PRN
  Administered 2012-06-11: 100 mg via INTRAVENOUS

## 2012-06-11 MED ORDER — MAGNESIUM SULFATE 40 MG/ML IJ SOLN
4.0000 g | Freq: Once | INTRAMUSCULAR | Status: AC
  Administered 2012-06-11: 4 g via INTRAVENOUS
  Filled 2012-06-11: qty 100

## 2012-06-11 MED ORDER — ONDANSETRON HCL 4 MG/2ML IJ SOLN: 4.0000 mg | Freq: Four times a day (QID) | INTRAMUSCULAR | Status: DC | PRN

## 2012-06-11 MED ORDER — MAGNESIUM SULFATE 50 % IJ SOLN
40.0000 meq | INTRAMUSCULAR | Status: DC
  Filled 2012-06-11: qty 10

## 2012-06-11 MED ORDER — INSULIN REGULAR BOLUS VIA INFUSION
0.0000 [IU] | Freq: Three times a day (TID) | INTRAVENOUS | Status: DC
  Filled 2012-06-11: qty 10

## 2012-06-11 MED ORDER — ACETAMINOPHEN 325 MG PO TABS: 650.0000 mg | ORAL_TABLET | ORAL | Status: DC | PRN

## 2012-06-11 MED ORDER — MIDAZOLAM HCL 2 MG/2ML IJ SOLN
INTRAMUSCULAR | Status: AC
  Administered 2012-06-11: 2 mg
  Filled 2012-06-11: qty 4

## 2012-06-11 MED ORDER — HEPARIN SODIUM (PORCINE) 5000 UNIT/ML IJ SOLN
4000.0000 [IU] | Freq: Once | INTRAMUSCULAR | Status: AC
  Administered 2012-06-11: 4000 [IU] via INTRAVENOUS

## 2012-06-11 MED ORDER — HEPARIN SODIUM (PORCINE) 1000 UNIT/ML IJ SOLN
INTRAMUSCULAR | Status: DC | PRN
  Administered 2012-06-11: 5000 [IU] via INTRAVENOUS
  Administered 2012-06-11: 25000 [IU] via INTRAVENOUS

## 2012-06-11 SURGICAL SUPPLY — 103 items
ADAPTER CARDIO PERF ANTE/RETRO (ADAPTER) ×3 IMPLANT
ADPR PRFSN 84XANTGRD RTRGD (ADAPTER) ×1
ATTRACTOMAT 16X20 MAGNETIC DRP (DRAPES) ×3 IMPLANT
BAG DECANTER FOR FLEXI CONT (MISCELLANEOUS) ×3 IMPLANT
BANDAGE ELASTIC 4 VELCRO ST LF (GAUZE/BANDAGES/DRESSINGS) ×3 IMPLANT
BANDAGE ELASTIC 6 VELCRO ST LF (GAUZE/BANDAGES/DRESSINGS) ×3 IMPLANT
BANDAGE GAUZE ELAST BULKY 4 IN (GAUZE/BANDAGES/DRESSINGS) ×3 IMPLANT
BASKET HEART  (ORDER IN 25'S) (MISCELLANEOUS) ×1
BASKET HEART (ORDER IN 25'S) (MISCELLANEOUS) ×1
BASKET HEART (ORDER IN 25S) (MISCELLANEOUS) ×1 IMPLANT
BLADE STERNUM SYSTEM 6 (BLADE) ×3 IMPLANT
BLADE SURG 12 STRL SS (BLADE) ×3 IMPLANT
BLADE SURG ROTATE 9660 (MISCELLANEOUS) IMPLANT
CANISTER SUCTION 2500CC (MISCELLANEOUS) ×3 IMPLANT
CANNULA AORTIC HI-FLOW 6.5M20F (CANNULA) ×3 IMPLANT
CANNULA ARTERIAL NVNT 3/8 20FR (MISCELLANEOUS) ×2 IMPLANT
CANNULA GUNDRY RCSP 15FR (MISCELLANEOUS) ×3 IMPLANT
CANNULA VENOUS MAL SGL STG 40 (MISCELLANEOUS) IMPLANT
CANNULAE VENOUS MAL SGL STG 40 (MISCELLANEOUS)
CATH CPB KIT VANTRIGT (MISCELLANEOUS) ×3 IMPLANT
CATH ROBINSON RED A/P 18FR (CATHETERS) ×9 IMPLANT
CATH THORACIC 28FR (CATHETERS) IMPLANT
CATH THORACIC 28FR RT ANG (CATHETERS) IMPLANT
CATH THORACIC 36FR (CATHETERS) IMPLANT
CATH THORACIC 36FR RT ANG (CATHETERS) ×6 IMPLANT
CLIP TI WIDE RED SMALL 24 (CLIP) IMPLANT
CLOTH BEACON ORANGE TIMEOUT ST (SAFETY) ×3 IMPLANT
COVER MAYO STAND STRL (DRAPES) ×2 IMPLANT
COVER SURGICAL LIGHT HANDLE (MISCELLANEOUS) ×3 IMPLANT
CRADLE DONUT ADULT HEAD (MISCELLANEOUS) ×3 IMPLANT
DRAIN CHANNEL 32F RND 10.7 FF (WOUND CARE) ×3 IMPLANT
DRAPE CARDIOVASCULAR INCISE (DRAPES) ×3
DRAPE SLUSH MACHINE 52X66 (DRAPES) ×2 IMPLANT
DRAPE SLUSH/WARMER DISC (DRAPES) IMPLANT
DRAPE SRG 135X102X78XABS (DRAPES) ×1 IMPLANT
DRSG COVADERM 4X14 (GAUZE/BANDAGES/DRESSINGS) ×3 IMPLANT
ELECT BLADE 4.0 EZ CLEAN MEGAD (MISCELLANEOUS) ×3
ELECT BLADE 6.5 EXT (BLADE) ×3 IMPLANT
ELECT CAUTERY BLADE 6.4 (BLADE) ×3 IMPLANT
ELECT REM PT RETURN 9FT ADLT (ELECTROSURGICAL) ×6
ELECTRODE BLDE 4.0 EZ CLN MEGD (MISCELLANEOUS) ×1 IMPLANT
ELECTRODE REM PT RTRN 9FT ADLT (ELECTROSURGICAL) ×2 IMPLANT
GLOVE BIO SURGEON STRL SZ7.5 (GLOVE) ×6 IMPLANT
GOWN STRL NON-REIN LRG LVL3 (GOWN DISPOSABLE) ×12 IMPLANT
HEMOSTAT POWDER SURGIFOAM 1G (HEMOSTASIS) ×9 IMPLANT
HEMOSTAT SURGICEL 2X14 (HEMOSTASIS) ×3 IMPLANT
INSERT FOGARTY XLG (MISCELLANEOUS) IMPLANT
KIT BASIN OR (CUSTOM PROCEDURE TRAY) ×3 IMPLANT
KIT ROOM TURNOVER OR (KITS) ×3 IMPLANT
KIT SUCTION CATH 14FR (SUCTIONS) ×3 IMPLANT
KIT VASOVIEW W/TROCAR VH 2000 (KITS) ×3 IMPLANT
LEAD PACING MYOCARDI (MISCELLANEOUS) ×3 IMPLANT
MARKER GRAFT CORONARY BYPASS (MISCELLANEOUS) ×9 IMPLANT
NS IRRIG 1000ML POUR BTL (IV SOLUTION) ×15 IMPLANT
PACK OPEN HEART (CUSTOM PROCEDURE TRAY) ×3 IMPLANT
PAD ARMBOARD 7.5X6 YLW CONV (MISCELLANEOUS) ×6 IMPLANT
PENCIL BUTTON HOLSTER BLD 10FT (ELECTRODE) ×3 IMPLANT
PUNCH AORTIC ROTATE 4.0MM (MISCELLANEOUS) IMPLANT
PUNCH AORTIC ROTATE 4.5MM 8IN (MISCELLANEOUS) ×2 IMPLANT
PUNCH AORTIC ROTATE 5MM 8IN (MISCELLANEOUS) IMPLANT
SET CARDIOPLEGIA MPS 5001102 (MISCELLANEOUS) ×2 IMPLANT
SPONGE GAUZE 4X4 12PLY (GAUZE/BANDAGES/DRESSINGS) ×6 IMPLANT
SURGIFLO W/THROMBIN 8M KIT (HEMOSTASIS) ×2 IMPLANT
SUT BONE WAX W31G (SUTURE) ×3 IMPLANT
SUT MNCRL AB 4-0 PS2 18 (SUTURE) IMPLANT
SUT PROLENE 3 0 SH DA (SUTURE) IMPLANT
SUT PROLENE 3 0 SH1 36 (SUTURE) IMPLANT
SUT PROLENE 4 0 RB 1 (SUTURE) ×6
SUT PROLENE 4 0 SH DA (SUTURE) ×3 IMPLANT
SUT PROLENE 4-0 RB1 .5 CRCL 36 (SUTURE) ×1 IMPLANT
SUT PROLENE 5 0 C 1 36 (SUTURE) IMPLANT
SUT PROLENE 5 0 C1 (SUTURE) ×2 IMPLANT
SUT PROLENE 6 0 C 1 30 (SUTURE) ×2 IMPLANT
SUT PROLENE 7 0 DA (SUTURE) IMPLANT
SUT PROLENE 7.0 RB 3 (SUTURE) ×11 IMPLANT
SUT PROLENE 8 0 BV175 6 (SUTURE) IMPLANT
SUT PROLENE BLUE 7 0 (SUTURE) ×5 IMPLANT
SUT SILK  1 MH (SUTURE)
SUT SILK 1 MH (SUTURE) IMPLANT
SUT SILK 2 0 SH CR/8 (SUTURE) ×2 IMPLANT
SUT SILK 3 0 SH CR/8 (SUTURE) IMPLANT
SUT STEEL 6MS V (SUTURE) ×6 IMPLANT
SUT STEEL STERNAL CCS#1 18IN (SUTURE) IMPLANT
SUT STEEL SZ 6 DBL 3X14 BALL (SUTURE) ×3 IMPLANT
SUT VIC AB 1 CTX 36 (SUTURE) ×6
SUT VIC AB 1 CTX36XBRD ANBCTR (SUTURE) ×2 IMPLANT
SUT VIC AB 2-0 CT1 27 (SUTURE) ×3
SUT VIC AB 2-0 CT1 TAPERPNT 27 (SUTURE) IMPLANT
SUT VIC AB 2-0 CTX 27 (SUTURE) IMPLANT
SUT VIC AB 3-0 SH 27 (SUTURE)
SUT VIC AB 3-0 SH 27X BRD (SUTURE) IMPLANT
SUT VIC AB 3-0 X1 27 (SUTURE) ×2 IMPLANT
SUT VICRYL 4-0 PS2 18IN ABS (SUTURE) IMPLANT
SUTURE E-PAK OPEN HEART (SUTURE) ×3 IMPLANT
SYSTEM SAHARA CHEST DRAIN ATS (WOUND CARE) ×3 IMPLANT
TAPE CLOTH SURG 4X10 WHT LF (GAUZE/BANDAGES/DRESSINGS) ×2 IMPLANT
TAPE PAPER 2X10 WHT MICROPORE (GAUZE/BANDAGES/DRESSINGS) ×2 IMPLANT
TOWEL OR 17X24 6PK STRL BLUE (TOWEL DISPOSABLE) ×6 IMPLANT
TOWEL OR 17X26 10 PK STRL BLUE (TOWEL DISPOSABLE) ×6 IMPLANT
TRAY FOLEY IC TEMP SENS 14FR (CATHETERS) ×3 IMPLANT
TUBING INSUFFLATION 10FT LAP (TUBING) ×3 IMPLANT
UNDERPAD 30X30 INCONTINENT (UNDERPADS AND DIAPERS) ×3 IMPLANT
WATER STERILE IRR 1000ML POUR (IV SOLUTION) ×6 IMPLANT

## 2012-06-11 NOTE — ED Notes (Signed)
Per EMS, pt was a witnessed arrest bystanders started CPR. First initial rhythm v-fib. Patient was shocked 4 times and 4 epi given PTA. 300mg  amio given also. Pt was in PEA about for a few minutes before 4th epi. Pt having purposeful movement an trying to breathe on his own upon arrival. 1 liter cold saline started PTA. 1 l NS given. 20 LAC. 7 ETT

## 2012-06-11 NOTE — Anesthesia Postprocedure Evaluation (Signed)
  Anesthesia Post-op Note  Patient: Jonathon Ryan  Procedure(s) Performed: Procedure(s) (LRB) with comments: CORONARY ARTERY BYPASS GRAFTING (CABG) ([name]) - Coronary Artery Bypass Grafting times four using left internal mammary artery and right greater saphenous vein endoscopically harvested.  Patient Location: SICU  Anesthesia Type: General  Level of Consciousness: sedated and Patient remains intubated per anesthesia plan  Airway and Oxygen Therapy: Patient remains intubated per anesthesia plan and Patient placed on Ventilator (see vital sign flow sheet for setting)  Post-op Pain: none  Post-op Assessment: Post-op Vital signs reviewed  Post-op Vital Signs: Reviewed  Complications: No apparent anesthesia complications

## 2012-06-11 NOTE — OR Nursing (Addendum)
Cath lab staff initiated surgical consent and patients wife signed consenting for procedure. Patient arrived to OR emergently,  intubated and on a balloon pump. Surgeon and Anesthesiologist were unable to sign surgical consent prior to incision being made. Safety zone portal completed.

## 2012-06-11 NOTE — ED Notes (Signed)
2 mg Versed given at 1230 in ED by Myrtha Mantis, RN

## 2012-06-11 NOTE — H&P (Signed)
Jonathon Ryan is an 56 y.o. male.   Chief Complaint:  STEMI HPI:   The patient is a 56 yo male who past medical history includes tobacco abuse treated HLD and that is all.  He was recently seen by PCP and told his cholesterol was fine.  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 He presented after witnessed VF arrest while at work.  Coworkers were with him when he began to feel poorly.  He collapsed and was caught and laid on the ground.  CPR was administered with in a minute and during the process the patient occasionally gasped for breath.  EMS arrived within ~2-41mins.  Shock x 4.  Epi.  Coworkers state he had not been complaining of any problems this past week.    History reviewed. No pertinent past medical history.  History reviewed. No pertinent past surgical history.  History reviewed. No pertinent family history. Social History:  does not have a smoking history on file. He does not have any smokeless tobacco history on file. His alcohol and drug histories not on file.  Allergies: Allergies not on file  No prescriptions prior to admission    Results for orders placed during the hospital encounter of 06/11/12 (from the past 48 hour(s))  APTT     Status: Abnormal   Collection Time   06/11/12 12:21 PM      Component Value Range Comment   aPTT 51 (*) 24 - 37 seconds   CBC     Status: Abnormal   Collection Time   06/11/12 12:21 PM      Component Value Range Comment   WBC 13.4 (*) 4.0 - 10.5 K/uL    RBC 4.74  4.22 - 5.81 MIL/uL    Hemoglobin 14.3  13.0 - 17.0 g/dL    HCT 95.2  84.1 - 32.4 %    MCV 91.4  78.0 - 100.0 fL    MCH 30.2  26.0 - 34.0 pg    MCHC 33.0  30.0 - 36.0 g/dL    RDW 40.1  02.7 - 25.3 %    Platelets 244  150 - 400 K/uL   COMPREHENSIVE METABOLIC PANEL     Status: Abnormal   Collection Time   06/11/12 12:21 PM      Component Value Range Comment   Sodium 139  135 - 145 mEq/L    Potassium 3.3 (*) 3.5 - 5.1 mEq/L    Chloride 99  96 - 112 mEq/L    CO2 12  (*) 19 - 32 mEq/L    Glucose, Bld 374 (*) 70 - 99 mg/dL    BUN 9  6 - 23 mg/dL    Creatinine, Ser 6.64  0.50 - 1.35 mg/dL    Calcium 8.7  8.4 - 40.3 mg/dL    Total Protein 6.4  6.0 - 8.3 g/dL    Albumin 3.3 (*) 3.5 - 5.2 g/dL    AST 474 (*) 0 - 37 U/L    ALT 98 (*) 0 - 53 U/L    Alkaline Phosphatase 90  39 - 117 U/L    Total Bilirubin 0.3  0.3 - 1.2 mg/dL    GFR calc non Af Amer 78 (*) >90 mL/min    GFR calc Af Amer >90  >90 mL/min   PROTIME-INR     Status: Abnormal   Collection Time   06/11/12 12:21 PM      Component Value Range Comment   Prothrombin Time 18.4 (*)  11.6 - 15.2 seconds    INR 1.50 (*) 0.00 - 1.49   POCT I-STAT TROPONIN I     Status: Abnormal   Collection Time   06/11/12 12:38 PM      Component Value Range Comment   Troponin i, poc 0.18 (*) 0.00 - 0.08 ng/mL    Comment NOTIFIED PHYSICIAN      Comment 3            POCT I-STAT, CHEM 8     Status: Abnormal   Collection Time   06/11/12 12:40 PM      Component Value Range Comment   Sodium 141  135 - 145 mEq/L    Potassium 3.3 (*) 3.5 - 5.1 mEq/L    Chloride 106  96 - 112 mEq/L    BUN 9  6 - 23 mg/dL    Creatinine, Ser 9.60  0.50 - 1.35 mg/dL    Glucose, Bld 454 (*) 70 - 99 mg/dL    Calcium, Ion 0.98 (*) 1.12 - 1.23 mmol/L    TCO2 15  0 - 100 mmol/L    Hemoglobin 15.0  13.0 - 17.0 g/dL    HCT 11.9  14.7 - 82.9 %    Dg Chest Port 1 View  06/11/2012  *RADIOLOGY REPORT*  Clinical Data: Cardiac arrest, intubation  PORTABLE CHEST - 1 VIEW  Comparison: None.  Findings: Unfortunately the mid chest is overlain by a superficial pad, and the best measurement shows the tip of the endotracheal tube to be approximately 4.6 cm above the carina.  There are diffusely prominent interstitial markings with pulmonary vascular congestion consistent with interstitial edema. No effusion is seen. The heart is mildly enlarged.  IMPRESSION:  1.  Probable interstitial edema. 2.  Endotracheal tube tip approximately 4.6 cm above the carina.   Original  Report Authenticated By: Juline Patch, M.D.     Review of Systems  Unable to perform ROS   Blood pressure 147/59, pulse 113, resp. rate 30, height 5\' 10"  (1.778 m), weight 75 kg (165 lb 5.5 oz), SpO2 95.00%. Physical Exam   Assessment/Plan Patient Active Hospital Problem List: STEMI (ST elevation myocardial infarction) (06/11/2012) Tobacco abuse (06/11/2012) HLD (hyperlipidemia) (06/11/2012)  Plan: Emergent left heart cath.    Jonathon Ryan 06/11/2012, 1:10 PM

## 2012-06-11 NOTE — Op Note (Signed)
Jonathon Ryan is a 56 y.o. male    [id number] LOCATION:  FACILITY: [facility]  PHYSICIAN: Jonathon Ryan, M.D. 08/28/1956   DATE OF PROCEDURE:  06/11/2012  DATE OF DISCHARGE:  [facility]  CARDIAC CATHETERIZATION     History obtained from chart review. The patient is a 56 yo male who past medical history includes tobacco abuse treated HLD and that is all. He was recently seen by PCP and told his cholesterol was fine. 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 He presented after witnessed VF arrest while at work. Coworkers were with him when he began to feel poorly. He collapsed and was caught and laid on the ground. CPR was administered with in a minute and during the process the patient occasionally gasped for breath. EMS arrived within ~2-64mins. Shock x 4. Epi. Patient was made a code stem he by Dr. Denton Lank in the [facility] ER. Arctic sun protocol was initiated. The patient was brought emergently to the cath lab for cath and potential intervention.    PROCEDURE DESCRIPTION:    The patient was brought to the second floor  [facility] lab in the postabsorptive state. He was  premedicated withVersed, fentanyl and propofol anesthesia.[name] His right and left groins Were prepped and shaved in usual sterile fashion. Xylocaine 1% was used  for local anesthesia. A 6 French sheath was inserted into the right common femoral  artery using standard Seldinger technique. The patient received  8000 units  of heparin  intravenously.  6 French right and left Judkins diagnostic catheters along with a 6 French pigtail catheter were used for selective coronary angiography, left ventriculography, subselective left internal mammary artery angiography, and distal abdominal aortography. Visipaque dye was used for the entirety of the case. Retrograde aortic, left ventricular and pulmonary pressures were recorded. Because of damping with the 6 French left 4 Judkins catheter  left main a 5 French catheter was utilized which damped as well.    HEMODYNAMICS:    AO SYSTOLIC/AO DIASTOLIC: 107/70   LV SYSTOLIC/LV DIASTOLIC: 105/26  ANGIOGRAPHIC RESULTS:   1. Left main; 90+ percent with a "hazy appearance". There was damping with a 5 French left Judkins catheter.  2. LAD; large vessel that wraps the apex, and was free of significant disease. 3. Left circumflex; nondominant though large vessel with a 80-90% proximal first OM branch which was a large vessel, and 75% mid AV groove.  4. Right coronary artery; dominant and occluded proximally with right to right collaterals and left to right collaterals. This did appear to be a chronic occlusion. 5.LIMA was subselectively visualized and patent. It was suitable for use during coronary artery bypass grafting if necessary. There was an incidentally noted 90% ostial left vertebral artery stenosis. 6. Left ventriculography; RAO left ventriculogram was performed using  25 mL of Visipaque dye at 12 mL/second. The overall LVEF estimated  30-35 %  With wall motion abnormalities notable for moderate to severe inferior wall hypokinesia and mild anteroapical hypokinesia.  7. Distal abdominal aortogram-distal abdominal aortography was performed with the 6 French pigtail catheter using 20 cc of Visipaque dye at 20 cc per second. The right renal artery had an 80-90% proximal stenosis. The infrarenal Dahmer noted just above the lip education was moderately atherosclerotic. The right proximal common iliac artery had an 80% segmental stenosis. Because of this an intra-aortic balloon pump could not be inserted in the right common femoral artery and therefore access was obtained  in the left common femoral artery to allow insertion of an intra-aortic balloon pump.  IMPRESSION:[name] had sudden cardiac death, witnessed with bystander CPR initiated probably. He has left main and three-vessel disease with moderate to severe LV dysfunction. He  would benefit from coronary bypass grafting performed emergently. Dr. Virgilio Frees was notified. A distal abdominal aortogram was performed revealing high-grade right renal artery stenosis as well as right common iliac artery stenosis. Because of this the left common femoral artery was accessed and an  intra-aortic balloon pump was placed for hemodynamic support. The patient was intubated during the case and was metabolically acidotic receiving several amps of intravenous sodium bicarbonate.  Runell Gess MD, [facility] 06/11/2012 2:06 PM

## 2012-06-11 NOTE — Progress Notes (Signed)
Setting per Dr. Zenaida Niece trigt. Place pt. On vent in OR as per md and walked with OR staff to transport pt. To 2305 as per md.

## 2012-06-11 NOTE — H&P (Signed)
     Pt was reexamined and existing H & P reviewed. No changes found.  Runell Gess, MD [facility] 06/11/2012 2:01 PM

## 2012-06-11 NOTE — Brief Op Note (Signed)
                   [address]            [place] [address]          [phone]    06/11/2012  5:55 PM  PATIENT:  Jonathon Ryan  56 y.o. male  PRE-OPERATIVE DIAGNOSIS:  Ischemic cardiac event  POST-OPERATIVE DIAGNOSIS:  Ischemic cardiac event  PROCEDURE:  Procedure(s): CORONARY ARTERY BYPASS GRAFTING (CABG)X4 LIMA-LAD; SEQ SVG-OM1-OM2; SVG-RCA EVH RIGHT LEG  SURGEON:  Surgeon(s): [name], MD  PHYSICIAN ASSISTANT: Sylvanna Burggraf PA-C  ANESTHESIA:   general  PATIENT CONDITION:  ICU - intubated and hemodynamically stable.IABP 1:1  PRE-OPERATIVE WEIGHT: 75kg  COMPLICATIONS: NO KNOWN

## 2012-06-11 NOTE — ED Provider Notes (Signed)
History     CSN: [id number]  Arrival date & time 06/11/12  1216   None     Chief Complaint  Patient presents with  . Cardiac Arrest    (Consider location/radiation/quality/duration/timing/severity/associated sxs/prior treatment) The history is provided by the patient.  pt arrives via ems s/p witness arrest. Was at work, walking from truck. No c/o prior to arrest. ems was called, found pt pulseless and apneic, unknown down time. ems notes initial rhythm was vfib. defib x 4. Amiodarone 300 mg iv. Epi.  ems intubated. Subsequently noted pea, w return of pulses subsequently by time of ed arrival. Level 5 caveat, pt intubated, unresponsive.      No past medical history on file.  No past surgical history on file.  No family history on file.  History  Substance Use Topics  . Smoking status: Not on file  . Smokeless tobacco: Not on file  . Alcohol Use: Not on file      Review of Systems  Unable to perform ROS: Intubated  level 5 caveat  Allergies  Review of patient's allergies indicates not on file.  Home Medications  No current outpatient prescriptions on file.  There were no vitals taken for this visit.  Physical Exam  Nursing note and vitals reviewed. Constitutional: He appears well-developed and well-nourished. No distress.  HENT:  Head: Atraumatic.  Eyes: Pupils are equal, round, and reactive to light.  Neck: Neck supple. No tracheal deviation present.  Cardiovascular: Regular rhythm, normal heart sounds and intact distal pulses.   Pulmonary/Chest: Effort normal. No accessory muscle usage. No respiratory distress.       bil bs with bag ventilation  Abdominal: Soft. He exhibits no distension. There is no tenderness.  Musculoskeletal: Normal range of motion. He exhibits no edema and no tenderness.  Neurological:       Eyes open. Moves bil extremities reaching towards chest/head area.   Skin: Skin is warm and dry.    ED Course  Procedures (including  critical care time)   Labs Reviewed  APTT  CBC  COMPREHENSIVE METABOLIC PANEL  PROTIME-INR      MDM  Iv ns. Pcxr. Labs. Code stemi/cath lab called re vfib arrest, lbbb on ecg.   Ativan for sedation.   Reviewed nursing notes and prior charts for additional history.    Date: 06/11/2012  Rate: 115  Rhythm: sinus tachycardia  QRS Axis: normal  Intervals: normal  ST/T Wave abnormalities: nonspecific ST/T changes  Conduction Disutrbances:left bundle branch block  Narrative Interpretation:   Old EKG Reviewed: none available        Suzi Roots, MD 06/16/12 [phone]

## 2012-06-11 NOTE — Progress Notes (Signed)
Pt admitted to ED for CPR, was transferred to cath lab and scheduled for surgery.  Offered support to family, prayer at their request.

## 2012-06-11 NOTE — Transfer of Care (Signed)
Immediate Anesthesia Transfer of Care Note  Patient: Jonathon Ryan  Procedure(s) Performed: Procedure(s) (LRB) with comments: CORONARY ARTERY BYPASS GRAFTING (CABG) ([name]) - Coronary Artery Bypass Grafting times four using left internal mammary artery and right greater saphenous vein endoscopically harvested.  Patient Location: SICU  Anesthesia Type: General  Level of Consciousness: sedated and unresponsive  Airway & Oxygen Therapy: Patient remains intubated per anesthesia plan and Patient placed on Ventilator (see vital sign flow sheet for setting)  Post-op Assessment: Report given to PACU RN and Post -op Vital signs reviewed and stable  Post vital signs: Reviewed and stable  Complications: No apparent anesthesia complications

## 2012-06-11 NOTE — Anesthesia Preprocedure Evaluation (Signed)
Anesthesia Evaluation Anesthesia Physical Anesthesia Plan  ASA: IV and Emergent  Anesthesia Plan:    Post-op Pain Management:    Induction:   Airway Management Planned:   Additional Equipment:   Intra-op Plan:   Post-operative Plan:   Informed Consent:   Plan Discussed with:   Anesthesia Plan Comments:         Anesthesia Quick Evaluation

## 2012-06-11 NOTE — Progress Notes (Addendum)
Acute MI with cardiogenic shock intubated with balloon pump  Cardiac catheterization showing severe left main stenosis chronic occlusion of the RCA EF 30% Patient examined the cath lab discussed with his cardiologist Dr. [name]. Recommendation is urgent-emergent surgical coronary revascularization which will be performed as soon as an operating room is available. Situation discussed with wife including indications for surgery risks involved and alternatives to surgery. She agrees to proceed underwent I feel was an informed consent. Patient is moving all extremities but is intubated and sedated in the cath lab. His pupil on the right side is enlarged but reactive slightly. A balloon pump is present in the left groin. We'll proceed with emergency CABG.                     [address]            [place] [address]          [phone]       OAKLEN THIAM [facility] Medical Record #[id number] Date of Birth: June 17, 1956  No ref. provider found No primary provider on file.  Chief Complaint:    Chief Complaint  Patient presents with  . Cardiac Arrest    History of Present Illness:     56 year old smoker with history of hyperlipidemia presented after a total hospital V. fib arrest and loss of consciousness. He is treated by coworkers with CPR and received cardioversion by EMT as well as intubation. Cardiac catheterization urgently performed demonstrated EF of 30%, severe left main stenosis and chronic occlusion right coronary with collateralization of the posterior descending. A balloon pump was placed in the cath lab. A chest x-ray showed pulmonary edema. Emergency surgical coronary pressurization was recommended and I discussed the procedure with the patient's wife and she understood and agreed to proceed.  According to wife the patient has had no angina no symptoms of CHF no known heart problems and no family history. 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 No pertinent past medical history     Past Surgical History  Procedure Date  . No past surgeries   . Coronary artery bypass graft 06/11/2012    Procedure: CORONARY ARTERY BYPASS GRAFTING (CABG);  Surgeon: [name], MD;  Location: [facility] OR;  Service: Open Heart Surgery;  Laterality: [name];  Coronary Artery Bypass Grafting times four using left internal mammary artery and right greater saphenous vein endoscopically harvested.    History  Smoking status  . Current Everyday Smoker -- 2.0 packs/day for 20 years  Smokeless tobacco  . Never Used    History  Alcohol Use No    History   Social History  . Marital Status: Married    Spouse Name: [name]    Number of Children: [name]  . Years of Education: [name]   Occupational History  . Not on file.   Social History Main Topics  . Smoking status: Current Everyday Smoker -- 2.0 packs/day for 20 years  . Smokeless tobacco: Never Used  . Alcohol Use: No  . Drug Use: No  . Sexually Active: Yes   Other Topics Concern  . Not on file   Social History Narrative  . No narrative on file    No Known Allergies  Current Facility-Administered Medications  Medication Dose Route Frequency Provider Last Rate Last Dose  . 0.45 % sodium chloride infusion  Intravenous Continuous [name], PA 20 mL/hr at 06/12/12 1600    . 0.9 %  sodium chloride infusion   Intravenous Continuous Suzi Roots, MD 10 mL/hr at 06/12/12 1600    . 0.9 %  sodium chloride infusion   Intravenous Continuous [name], PA 10 mL/hr at 06/12/12 1600    . 0.9 %  sodium chloride infusion  250 mL Intravenous Continuous [name], PA 1 mL/hr at 06/12/12 1500 250 mL at 06/12/12 1500  . acetaminophen (TYLENOL) solution 650 mg  650 mg Per Tube NOW [name], PA   650 mg at 06/11/12 2200   Or  . acetaminophen (TYLENOL) suppository 650 mg  650 mg Rectal NOW [name], PA        . acetaminophen (TYLENOL) tablet 1,000 mg  1,000 mg Oral Q6H Wayne E Gold, PA       Or  . acetaminophen (TYLENOL) solution 975 mg  975 mg Per Tube Q6H [name], PA   975 mg at 06/12/12 0622  . albumin human 5 % solution 250 mL  250 mL Intravenous Q15 min PRN [name], PA   250 mL at 06/12/12 0200  . antiseptic oral rinse (BIOTENE) solution 15 mL  15 mL Mouth Rinse QID [name], MD   15 mL at 06/12/12 1544  . aspirin EC tablet 325 mg  325 mg Oral Daily [name], PA       Or  . aspirin chewable tablet 324 mg  324 mg Per Tube Daily [name], PA   324 mg at 06/12/12 0942  . bisacodyl (DULCOLAX) EC tablet 10 mg  10 mg Oral Daily [name], PA       Or  . bisacodyl (DULCOLAX) suppository 10 mg  10 mg Rectal Daily [name], PA      . budesonide (PULMICORT) nebulizer solution 0.5 mg  0.5 mg Nebulization BID [name], MD   0.5 mg at 06/12/12 0831  . cefUROXime (ZINACEF) 1.5 g in dextrose 5 % 50 mL IVPB  1.5 g Intravenous Q12H Wayne E Gold, PA   1.5 g at 06/12/12 1529  . chlorhexidine (PERIDEX) 0.12 % solution 15 mL  15 mL Mouth Rinse BID [name], MD   15 mL at 06/12/12 0736  . chlorhexidine (PERIDEX) 0.12 % solution        15 mL at 06/11/12 2300  . dexmedetomidine (PRECEDEX) 200 mcg / 50 mL infusion  0.1-0.7 mcg/kg/hr Intravenous Continuous [name], PA 13.1 mL/hr at 06/12/12 1600 0.7 mcg/kg/hr at 06/12/12 1600  . docusate sodium (COLACE) capsule 200 mg  200 mg Oral Daily [name], [place]      . DOPamine (INTROPIN) 800 mg in dextrose 5 % 250 mL infusion  0-10 mcg/kg/min Intravenous Continuous [name], PA 5.6 mL/hr at 06/12/12 1600 4 mcg/kg/min at 06/12/12 1600  . EPINEPHrine (ADRENALIN) 0.1 MG/ML injection           . famotidine (PEPCID) IVPB 20 mg  20 mg Intravenous Q12H [name], PA   20 mg at 06/12/12 4098  . furosemide (LASIX) injection 20 mg  20 mg Intravenous BID [name], MD   20 mg at 06/12/12 1017  . insulin aspart (novoLOG)  injection 0-24 Units  0-24 Units Subcutaneous Q4H [name], MD      . insulin glargine (LANTUS) injection 12 Units  12  Units Subcutaneous Daily [name], MD   12 Units at 06/12/12 [phone]  . insulin regular (NOVOLIN R,HUMULIN R) 1 Units/mL in sodium chloride 0.9 % 100 mL infusion   Intravenous Continuous Wayne E Gold, PA   2.8 Units/hr at 06/12/12 1100  . insulin regular bolus via infusion 0-10 Units  0-10 Units Intravenous TID WC Wayne E Gold, PA      . lactated ringers infusion 500 mL  500 mL Intravenous Once PRN [name], PA      . levalbuterol (XOPENEX) nebulizer solution 0.63 mg  0.63 mg Nebulization Q6H Wayne E Gold, PA   0.63 mg at 06/12/12 1516  . LORazepam (ATIVAN) 2 MG/ML injection           . magnesium sulfate IVPB 4 g 100 mL  4 g Intravenous Once [name], PA   4 g at 06/11/12 2228  . metoprolol (LOPRESSOR) injection 2.5-5 mg  2.5-5 mg Intravenous Q2H PRN [name], PA      . metoprolol tartrate (LOPRESSOR) tablet 12.5 mg  12.5 mg Oral BID [name], PA       Or  . metoprolol tartrate (LOPRESSOR) 25 mg/10 mL oral suspension 12.5 mg  12.5 mg Per Tube BID [name], PA      . midazolam (VERSED) injection 2 mg  2 mg Intravenous Q1H PRN [name], PA   2 mg at 06/12/12 1322  . milrinone (PRIMACOR) infusion 200 mcg/mL (0.2 mg/mL)  0.3 mcg/kg/min Intravenous Continuous Wayne E Gold, PA 6.8 mL/hr at 06/12/12 1600 0.3 mcg/kg/min at 06/12/12 1600  . morphine 2 MG/ML injection 1-4 mg  1-4 mg Intravenous Q1H PRN Wayne E Gold, PA      . morphine 2 MG/ML injection 2-5 mg  2-5 mg Intravenous Q1H PRN [name], PA   2 mg at 06/12/12 1540  . norepinephrine (LEVOPHED) 4 mg in dextrose 5 % 250 mL infusion  2-50 mcg/min Intravenous Titrated [name], MD 18.8 mL/hr at 06/12/12 1600 5 mcg/min at 06/12/12 1600  . ondansetron (ZOFRAN) injection 4 mg  4 mg Intravenous Q6H PRN [name], PA      . oxyCODONE (Oxy IR/ROXICODONE) immediate release tablet 5-10 mg  5-10 mg  Oral Q3H PRN [name], PA      . pantoprazole (PROTONIX) EC tablet 40 mg  40 mg Oral Q1200 Wayne E Gold, PA      . pneumococcal 23 valent vaccine (PNU-IMMUNE) injection 0.5 mL  0.5 mL Intramuscular Once Kathlee Nations Trigt, MD      . potassium chloride 10 mEq in 50 mL *CENTRAL LINE* IVPB  10 mEq Intravenous Q1 Hr x 3 Wayne E Gold, PA   10 mEq at 06/12/12 0030  . potassium chloride 10 mEq in 50 mL *CENTRAL LINE* IVPB  10 mEq Intravenous Once [name], MD   10 mEq at 06/12/12 0145  . sodium chloride 0.9 % injection 3 mL  3 mL Intravenous Q12H [name], PA   3 mL at 06/12/12 0948  . sodium chloride 0.9 % injection 3 mL  3 mL Intravenous PRN [name], PA      . vancomycin (VANCOCIN) IVPB 1000 mg/200 mL premix  1,000 mg Intravenous Q12H [name], PA   1,000 mg at 06/12/12 1530  . DISCONTD: 0.9 %  sodium chloride infusion   Intravenous Continuous Runell Gess, MD      .  DISCONTD: acetaminophen (TYLENOL) tablet 650 mg  650 mg Oral Q4H PRN Runell Gess, MD      . DISCONTD: hemostatic agents    PRN [name], MD   1 application at 06/11/12 1823  . DISCONTD: lactated ringers infusion   Intravenous Continuous [name], PA 20 mL/hr at 06/12/12 0800    . DISCONTD: levalbuterol (XOPENEX) nebulizer solution 1.25 mg  1.25 mg Nebulization TID [name], MD      . DISCONTD: lidocaine (cardiac) IV  infusion 4 mg/mL  1 mg/min Intravenous To OR [name], MD      . DISCONTD: lidocaine (cardiac) IV  infusion 4 mg/mL  1 mg/min Intravenous To OR [name], MD      . DISCONTD: lidocaine (cardiac) IV  infusion 4 mg/mL  1 mg/min Intravenous To OR [name], MD 30 mL/hr at 06/12/12 0800 2 mg/min at 06/12/12 0800  . DISCONTD: morphine 2 MG/ML injection 1 mg  1 mg Intravenous Q1H PRN Runell Gess, MD      . DISCONTD: nitroGLYCERIN 0.2 mg/mL in dextrose 5 % infusion  0-100 mcg/min Intravenous Continuous [name], PA   10 mcg/min at 06/11/12 2045  . DISCONTD:  nitroglycerin-nicardipine-HEPARIN-sodium bicarbonate irrigation for artery spasm    PRN [name], MD      . DISCONTD: ondansetron [facility]) injection 4 mg  4 mg Intravenous Q6H PRN Runell Gess, MD      . DISCONTD: phenylephrine (NEO-SYNEPHRINE) 20,000 mcg in dextrose 5 % 250 mL infusion  0-100 mcg/min Intravenous Continuous Wayne E Gold, PA 3.8 mL/hr at 06/12/12 0900 5 mcg/min at 06/12/12 0900  . DISCONTD: propofol (DIPRIVAN) 10 mg/ml infusion  5-70 mcg/kg/min Intravenous Once Suzi Roots, MD       Facility-Administered Medications Ordered in Other Encounters  Medication Dose Route Frequency Provider Last Rate Last Dose  . DISCONTD: albumin human 5 % solution    Continuous PRN Andree Elk, CRNA      . DISCONTD: calcium chloride injection    PRN Andree Elk, CRNA   300 mg at 06/11/12 1945  . DISCONTD: cefUROXime (ZINACEF) 1.5 g in dextrose 5 % 50 mL IVPB  1.5 g  Continuous PRN Andree Elk, CRNA   1.5 g at 06/11/12 1457  . DISCONTD: dexmedetomidine (PRECEDEX) 200 mcg in sodium chloride 0.9 % 50 mL infusion  200 mcg  Continuous PRN Andree Elk, CRNA 11.3 mL/hr at 06/11/12 1945 0.6 mcg/kg/hr at 06/11/12 1945  . DISCONTD: DOPamine (INTROPIN) 800 mg in dextrose 5 % 250 mL infusion    Continuous PRN Andree Elk, CRNA 5.6 mL/hr at 06/11/12 2010 4 mcg/kg/min at 06/11/12 2010  . DISCONTD: fentaNYL (SUBLIMAZE) injection    PRN Marena Chancy, CRNA   250 mcg at 06/11/12 1834  . DISCONTD: furosemide (LASIX) injection    PRN Andree Elk, CRNA   10 mg at 06/11/12 1916  . DISCONTD: heparin injection    PRN Andree Elk, CRNA   25,000 Units at 06/11/12 1610  . DISCONTD: insulin regular (NOVOLIN R,HUMULIN R) 100 Units in sodium chloride 0.9 % 100 mL infusion  100 Units  Continuous PRN Andree Elk, CRNA 0.7 mL/hr at 06/11/12 1850 0.7 Units/hr at 06/11/12 1850  . DISCONTD: lactated ringers infusion    Continuous PRN Marena Chancy, CRNA      .  DISCONTD: lactated ringers infusion    Continuous PRN Casimiro Needle  Jenetta Downer, CRNA      . DISCONTD: lactated ringers infusion    Continuous PRN Andree Elk, CRNA      . DISCONTD: lidocaine (cardiac) 100 mg/105ml (XYLOCAINE) 20 MG/ML injection 2%    PRN Andree Elk, CRNA   100 mg at 06/11/12 1608  . DISCONTD: lidocaine (cardiac) IV  infusion 4 mg/mL    Continuous PRN Andree Elk, CRNA 30 mL/hr at 06/11/12 1920 2 mg/min at 06/11/12 1920  . DISCONTD: midazolam (VERSED) 5 MG/5ML injection    PRN Marena Chancy, CRNA   5 mg at 06/11/12 1808  . DISCONTD: milrinone (PRIMACOR) infusion 200 mcg/mL (0.2 mg/mL)    Continuous PRN Andree Elk, CRNA 6.8 mL/hr at 06/11/12 1926 0.3 mcg/kg/min at 06/11/12 1926  . DISCONTD: nitroGLYCERIN 0.2 mg/mL in dextrose 5 % infusion    Continuous PRN Andree Elk, CRNA 3 mL/hr at 06/11/12 1458 10 mcg/min at 06/11/12 1458  . DISCONTD: phenylephrine (NEO-SYNEPHRINE) 0.08 mg/mL in dextrose 5 % 250 mL infusion  20 mg  Continuous PRN Andree Elk, CRNA 24.8 mL/hr at 06/11/12 2010 33 mcg/min at 06/11/12 2010  . DISCONTD: phenylephrine (NEO-SYNEPHRINE) injection    PRN Andree Elk, CRNA   80 mcg at 06/11/12 1521  . DISCONTD: protamine injection    PRN Andree Elk, CRNA   50 mg at 06/11/12 1850  . DISCONTD: rocuronium (ZEMURON) injection    PRN Marena Chancy, CRNA   50 mg at 06/11/12 1806  . DISCONTD: tranexamic acid (CYKLOLAPRON) 2,500 mg in sodium chloride 0.9 % 250 mL infusion  2,500 mg  Continuous PRN Andree Elk, CRNA 11.3 mL/hr at 06/11/12 1500 1.5 mg/kg/hr at 06/11/12 1500  . DISCONTD: vancomycin (VANCOCIN) 1,250 mg in sodium chloride 0.9 % 250 mL IVPB  1,250 mg Intravenous Continuous PRN Andree Elk, CRNA   1,250 mg at 06/11/12 1456     Family History  Problem Relation Age of Onset  . Family history unknown: Yes     Review of Systems:     Cardiac Review of Systems: Y or N  Chest Pain [  n  ]  Resting  SOB [n   ] Exertional SOB  [n  ]  Orthopnea Milo.Brash  ]   Pedal Edema [ n  ]    Palpitations [ n ] Syncope  [ y ]   Presyncope [   ]  General Review of Systems: [Y] = yes [  ]=no Constitional: recent weight change [  ]; anorexia [  ]; fatigue [  ]; nausea [  ]; night sweats [  ]; fever [  ]; or chills [  ];                                                                                                                                          Dental:  poor dentition[  ]; Last Dentist visit:>1 yr  Eye : blurred vision [  ]; diplopia [   ]; vision changes [  ];  Amaurosis fugax[  ]; Resp: cough [  ];  wheezing[  ];  hemoptysis[  ]; shortness of breath[  ]; paroxysmal nocturnal dyspnea[  ]; dyspnea on exertion[  ]; or orthopnea[  ];  GI:  gallstones[  ], vomiting[  ];  dysphagia[  ]; melena[  ];  hematochezia [  ]; heartburn[  ];   Hx of  Colonoscopy[  ]; GU: kidney stones [  ]; hematuria[  ];   dysuria [  ];  nocturia[  ];  history of     obstruction [  ];                 Skin: rash, swelling[  ];, hair loss[  ];  peripheral edema[  ];  or itching[  ]; Musculosketetal: myalgias[  ];  joint swelling[  ];  joint erythema[  ];  joint pain[  ];  back pain[  ];  Heme/Lymph: bruising[  ];  bleeding[  ];  anemia[  ];  Neuro: TIA[  ];  headaches[  ];  stroke[  ];  vertigo[  ];  seizures[  ];   paresthesias[  ];  difficulty walking[  ];  Psych:depression[  ]; anxiety[  ];  Endocrine: diabetes[  ];  thyroid dysfunction[  ];  Immunizations: Flu [  ]; Pneumococcal[  ];  Other:  Physical Exam: BP 95/46  Pulse 90  Temp 100.4 F (38 C) (Core (Comment))  Resp 14  Ht 5\' 10"  (1.778 m)  Wt 163 lb 2.3 oz (74 kg)  BMI 23.41 kg/m2  SpO2 99%  Physical exam Gen.-Intubated in the cardiac cath lab balloon pump and groin sheath in place blood pressure stable HEENT right pupil slightly larger than left ET tube in place Chest bilateral rales no scars or deformity Cardiac sinus tachycardia no audible murmur Abdomen soft  without pulsatile mass Extremities no scarring no cyanosis nonpalpable pedal pulses Neurologic sedated on the ventilator   Diagnostic Studies & Laboratory data:   Coronary tear grams reviewed with Dr. [name] in the cardiac cath lab. 90% left main stenosis total occlusion right coronary severe LV dysfunction.  Recent Radiology Findings:   Dg Chest Portable 1 View In Am  06/12/2012  *RADIOLOGY REPORT*  Clinical Data: Postop CABG  PORTABLE CHEST - 1 VIEW  Comparison: 06/11/2012  Findings: Endotracheal tube terminates 6 cm above the carina. Enteric tube courses below the diaphragm.  Right IJ Swan-Ganz catheter has been mildly adjusted with its tip in the right pulmonary artery. Intra-aortic balloon pump.  Bilateral chest tubes and mediastinal drain.  No pneumothorax is seen.  Cardiomegaly. Postsurgical changes related to prior CABG.  Stable patchy right midlung opacity / atelectasis.  No frank interstitial edema.  IMPRESSION: Endotracheal tube terminates 6 cm above the carina.  Additional support apparatus as above.  No pneumothorax is seen.   Original Report Authenticated By: Charline Bills, M.D.    Dg Chest Portable 1 View  06/11/2012  *RADIOLOGY REPORT*  Clinical Data: Postop CABG  PORTABLE CHEST - 1 VIEW  Comparison: 06/11/2012  Findings: 2040 hours.  Endotracheal tube tip is approximately 5.7 cm above the base of the carina.  NG tube is incompletely visualized, but appears to be looped in the stomach with the tip over the fundus.  Right IJ pulmonary catheter remains looped upon itself in the left lower lobe  pulmonary artery before extending back across into the right main pulmonary artery or potentially just into the proximal interlobar pulmonary artery.  Tip of the intra-aortic balloon pump projects slightly lower than on the previous study.  Aortic contours are not well defined on this study, but the tip appears to project over the region of the transverse aorta.    Bilateral chest tubes are noted  without evidence for pneumothorax.  There is a midline mediastinal / pericardial drain.  The interstitial pulmonary edema pattern persists but is slightly obscured by motion on the current film.  There is persistent atelectasis in the right perihilar region. Telemetry leads overlie the chest.  IMPRESSION: Pulmonary edema pattern appears improved in the interval although this may be in part related to obscuration by motion.  The pulmonary artery catheter remains looped upon itself in a left lower lobe pulmonary artery before passing back into the right main pulmonary artery.  Although the dome of each hemidiaphragm remains well delineated, there is no evidence for a deep sulcus on the right and the potential anterior basilar pneumothorax seen on the previous study is not definitely evident on this exam.   Original Report Authenticated By: ERIC A. MANSELL, M.D.    Dg Chest Portable 1 View  06/11/2012  *RADIOLOGY REPORT*  Clinical Data: Status post CABG.  PORTABLE CHEST - 1 VIEW  Comparison: Earlier the same date.  Findings: 1956 hours. Interval median sternotomy and CABG.  The endotracheal tube tip is in the mid trachea.  Right IJ Swan-Ganz catheter tip appears looped in the left lower lobe pulmonary artery and extends back into the proximal right pulmonary artery.  A nasogastric tube projects below the diaphragm.  Left chest tube and mediastinal drains are in place.  There is an intra-aortic balloon pump with its marker 3 cm inferior to the top of the aortic arch.  The pulmonary edema has worsened.  There is perihilar atelectasis on the right.  There is a possible small pneumothorax medially at the right lung base.  No significant pleural effusion is seen.  IMPRESSION:  1.  Worsened pulmonary edema with right perihilar atelectasis status post CABG. 2.  Possible small right basilar pneumothorax. 3.  Support system positioned as above. The Swan-Ganz catheter appears looped within the pulmonary artery.   Original  Report Authenticated By: Gerrianne Scale, M.D.    Dg Chest Port 1 View  06/11/2012  *RADIOLOGY REPORT*  Clinical Data: Cardiac arrest, intubation  PORTABLE CHEST - 1 VIEW  Comparison: None.  Findings: Unfortunately the mid chest is overlain by a superficial pad, and the best measurement shows the tip of the endotracheal tube to be approximately 4.6 cm above the carina.  There are diffusely prominent interstitial markings with pulmonary vascular congestion consistent with interstitial edema. No effusion is seen. The heart is mildly enlarged.  IMPRESSION:  1.  Probable interstitial edema. 2.  Endotracheal tube tip approximately 4.6 cm above the carina.   Original Report Authenticated By: Juline Patch, M.D.       Recent Lab Findings: Lab Results  Component Value Date   WBC 13.1* 06/12/2012   HGB 8.8* 06/12/2012   HCT 24.9* 06/12/2012   PLT 173 06/12/2012   GLUCOSE 177* 06/12/2012   ALT 54* 06/12/2012   AST 151* 06/12/2012   NA 139 06/12/2012   K 3.9 06/12/2012   CL 103 06/12/2012   CREATININE 1.53* 06/12/2012   BUN 14 06/12/2012   CO2 27 06/12/2012   INR 1.53* 06/11/2012  Assessment / Plan:      Patient was prepared for emergency multivessel bypass grafting in the operating room with intraoperative transesophageal 2-D echo.

## 2012-06-11 NOTE — OR Nursing (Signed)
Patient transferred to OR with belongings in bag containing a pair of brown shoes, pair of gray socks, khaki pants with brown belt, white undershirt, and brown shirt. Patient's label placed on patient belongings bag and will transfer with patient to SICU post-operatively.

## 2012-06-12 ENCOUNTER — Encounter (HOSPITAL_COMMUNITY): Payer: Self-pay | Admitting: *Deleted

## 2012-06-12 ENCOUNTER — Inpatient Hospital Stay (HOSPITAL_COMMUNITY): Payer: BC Managed Care – PPO

## 2012-06-12 DIAGNOSIS — I255 Ischemic cardiomyopathy: Secondary | ICD-10-CM | POA: Diagnosis present

## 2012-06-12 DIAGNOSIS — I2510 Atherosclerotic heart disease of native coronary artery without angina pectoris: Secondary | ICD-10-CM | POA: Diagnosis present

## 2012-06-12 DIAGNOSIS — I739 Peripheral vascular disease, unspecified: Secondary | ICD-10-CM | POA: Diagnosis present

## 2012-06-12 DIAGNOSIS — Z8674 Personal history of sudden cardiac arrest: Secondary | ICD-10-CM | POA: Insufficient documentation

## 2012-06-12 LAB — PREPARE PLATELET PHERESIS: Unit division: 0

## 2012-06-12 LAB — COMPREHENSIVE METABOLIC PANEL
Albumin: 3.6 g/dL (ref 3.5–5.2)
Alkaline Phosphatase: 47 U/L (ref 39–117)
BUN: 14 mg/dL (ref 6–23)
Creatinine, Ser: 1.53 mg/dL — ABNORMAL HIGH (ref 0.50–1.35)
GFR calc Af Amer: 57 mL/min — ABNORMAL LOW (ref >90)
Glucose, Bld: 177 mg/dL — ABNORMAL HIGH (ref 70–99)
Potassium: 3.9 mEq/L (ref 3.5–5.1)
Total Bilirubin: 0.7 mg/dL (ref 0.3–1.2)
Total Protein: 5.2 g/dL — ABNORMAL LOW (ref 6.0–8.3)

## 2012-06-12 LAB — CK TOTAL AND CKMB (NOT AT ARMC)
CK, MB: 80.4 ng/mL (ref 0.3–4.0)
Relative Index: 2.8 — ABNORMAL HIGH (ref 0.0–2.5)
Total CK: 4448 U/L — ABNORMAL HIGH (ref 7–232)
Total CK: 4368 U/L — ABNORMAL HIGH (ref 7–232)

## 2012-06-12 LAB — POCT I-STAT, CHEM 8
BUN: 16 mg/dL (ref 6–23)
Calcium, Ion: 1.14 mmol/L (ref 1.12–1.23)
Chloride: 100 mEq/L (ref 96–112)
Creatinine, Ser: 1.9 mg/dL — ABNORMAL HIGH (ref 0.50–1.35)
Glucose, Bld: 193 mg/dL — ABNORMAL HIGH (ref 70–99)

## 2012-06-12 LAB — POCT I-STAT 3, ART BLOOD GAS (G3+)
Acid-Base Excess: 4 mmol/L — ABNORMAL HIGH (ref 0.0–2.0)
Acid-Base Excess: 2 mmol/L (ref 0.0–2.0)
Acid-Base Excess: 2 mmol/L (ref 0.0–2.0)
Bicarbonate: 26.5 mEq/L — ABNORMAL HIGH (ref 20.0–24.0)
Bicarbonate: 26.6 mEq/L — ABNORMAL HIGH (ref 20.0–24.0)
O2 Saturation: 100 %
O2 Saturation: 97 %
Patient temperature: 37.2
Patient temperature: 37.2
TCO2: 30 mmol/L (ref 0–100)
pO2, Arterial: 100 mmHg (ref 80.0–100.0)

## 2012-06-12 LAB — MAGNESIUM: Magnesium: 1.9 mg/dL (ref 1.5–2.5)

## 2012-06-12 LAB — CBC
HCT: 24.9 % — ABNORMAL LOW (ref 39.0–52.0)
MCH: 30.3 pg (ref 26.0–34.0)
MCHC: 35.3 g/dL (ref 30.0–36.0)
MCHC: 35.1 g/dL (ref 30.0–36.0)
Platelets: 173 10*3/uL (ref 150–400)
Platelets: 134 10*3/uL — ABNORMAL LOW (ref 150–400)
RDW: 12.6 % (ref 11.5–15.5)
RDW: 12.7 % (ref 11.5–15.5)
WBC: 13.1 10*3/uL — ABNORMAL HIGH (ref 4.0–10.5)

## 2012-06-12 LAB — GLUCOSE, CAPILLARY
Glucose-Capillary: 136 mg/dL — ABNORMAL HIGH (ref 70–99)
Glucose-Capillary: 129 mg/dL — ABNORMAL HIGH (ref 70–99)
Glucose-Capillary: 107 mg/dL — ABNORMAL HIGH (ref 70–99)
Glucose-Capillary: 130 mg/dL — ABNORMAL HIGH (ref 70–99)

## 2012-06-12 LAB — TROPONIN I: Troponin I: >20 ng/mL (ref <0.30)

## 2012-06-12 LAB — PREPARE FRESH FROZEN PLASMA

## 2012-06-12 LAB — CREATININE, SERUM: Creatinine, Ser: 1.82 mg/dL — ABNORMAL HIGH (ref 0.50–1.35)

## 2012-06-12 MED ORDER — BIOTENE DRY MOUTH MT LIQD
15.0000 mL | Freq: Four times a day (QID) | OROMUCOSAL | Status: DC
  Administered 2012-06-12 – 2012-06-13 (×4): 15 mL via OROMUCOSAL

## 2012-06-12 MED ORDER — POTASSIUM CHLORIDE 10 MEQ/50ML IV SOLN
10.0000 meq | INTRAVENOUS | Status: AC
  Administered 2012-06-12 (×3): 10 meq via INTRAVENOUS
  Filled 2012-06-12: qty 150

## 2012-06-12 MED ORDER — FUROSEMIDE 10 MG/ML IJ SOLN
20.0000 mg | Freq: Two times a day (BID) | INTRAMUSCULAR | Status: DC
  Administered 2012-06-12 – 2012-06-13 (×3): 20 mg via INTRAVENOUS
  Filled 2012-06-12 (×4): qty 2

## 2012-06-12 MED ORDER — INSULIN GLARGINE 100 UNIT/ML ~~LOC~~ SOLN
12.0000 [IU] | Freq: Every day | SUBCUTANEOUS | Status: DC
  Administered 2012-06-12 – 2012-06-15 (×4): 12 [IU] via SUBCUTANEOUS

## 2012-06-12 MED ORDER — INSULIN ASPART 100 UNIT/ML ~~LOC~~ SOLN
0.0000 [IU] | SUBCUTANEOUS | Status: DC
  Administered 2012-06-12 – 2012-06-13 (×3): 2 [IU] via SUBCUTANEOUS
  Administered 2012-06-13: 4 [IU] via SUBCUTANEOUS

## 2012-06-12 MED ORDER — PNEUMOCOCCAL VAC POLYVALENT 25 MCG/0.5ML IJ INJ
0.5000 mL | INJECTION | Freq: Once | INTRAMUSCULAR | Status: AC
  Administered 2012-06-17: 0.5 mL via INTRAMUSCULAR
  Filled 2012-06-12 (×2): qty 0.5

## 2012-06-12 MED ORDER — CHLORHEXIDINE GLUCONATE 0.12 % MT SOLN
15.0000 mL | Freq: Two times a day (BID) | OROMUCOSAL | Status: DC
  Administered 2012-06-12 – 2012-06-13 (×4): 15 mL via OROMUCOSAL
  Filled 2012-06-12 (×4): qty 15

## 2012-06-12 MED FILL — Magnesium Sulfate Inj 50%: INTRAMUSCULAR | Qty: 10 | Status: AC

## 2012-06-12 MED FILL — Potassium Chloride Inj 2 mEq/ML: INTRAVENOUS | Qty: 40 | Status: AC

## 2012-06-12 MED FILL — Dexmedetomidine HCl IV Soln 200 MCG/2ML: INTRAVENOUS | Qty: 2 | Status: AC

## 2012-06-12 NOTE — Progress Notes (Signed)
Subjective:  POD # 1 emergent CABG X 4. Intubated and sedated but responsive  Objective:  Temp:  [97.5 F (36.4 C)-99.5 F (37.5 C)] 99.5 F (37.5 C) (09/05 0800) Pulse Rate:  [88-152] 90  (09/05 0832) Resp:  [7-30] 14  (09/05 0832) BP: (62-147)/(24-88) 125/54 mmHg (09/05 0832) SpO2:  [95 %-100 %] 100 % (09/05 0832) Arterial Line BP: (92-130)/(34-69) 104/49 mmHg (09/05 0800) FiO2 (%):  [40 %-100 %] 40 % (09/05 0832) Weight:  [74 kg (163 lb 2.3 oz)-75 kg (165 lb 5.5 oz)] 74 kg (163 lb 2.3 oz) (09/05 0500) Weight change:   Intake/Output from previous day: 09/04 0701 - 09/05 0700 In: 7599.1 [I.V.:4986.1; Blood:1093; IV Piggyback:1520] Out: 9350 [Urine:8760; Chest Tube:590]  Intake/Output from this shift: Total I/O In: 356.8 [I.V.:356.8] Out: 60 [Urine:30; Chest Tube:30]  Physical Exam: General appearance: Sedated Neck: no adenopathy, no carotid bruit, no JVD, supple, symmetrical, trachea midline and thyroid not enlarged, symmetric, no tenderness/mass/nodules Lungs: clear to auscultation bilaterally Heart: regular rate and rhythm, S1, S2 normal, no murmur, click, rub or gallop Extremities: Groins OK. Sheath right groin being removed. IABP in left groin  Lab Results: Results for orders placed during the hospital encounter of 06/11/12 (from the past 48 hour(s))  APTT     Status: Abnormal   Collection Time   06/11/12 12:21 PM      Component Value Range Comment   aPTT 51 (*) 24 - 37 seconds   CBC     Status: Abnormal   Collection Time   06/11/12 12:21 PM      Component Value Range Comment   WBC 13.4 (*) 4.0 - 10.5 K/uL    RBC 4.74  4.22 - 5.81 MIL/uL    Hemoglobin 14.3  13.0 - 17.0 g/dL    HCT 74.2  59.5 - 63.8 %    MCV 91.4  78.0 - 100.0 fL    MCH 30.2  26.0 - 34.0 pg    MCHC 33.0  30.0 - 36.0 g/dL    RDW 75.6  43.3 - 29.5 %    Platelets 244  150 - 400 K/uL   COMPREHENSIVE METABOLIC PANEL     Status: Abnormal   Collection Time   06/11/12 12:21 PM      Component Value  Range Comment   Sodium 139  135 - 145 mEq/L    Potassium 3.3 (*) 3.5 - 5.1 mEq/L    Chloride 99  96 - 112 mEq/L    CO2 12 (*) 19 - 32 mEq/L    Glucose, Bld 374 (*) 70 - 99 mg/dL    BUN 9  6 - 23 mg/dL    Creatinine, Ser 1.88  0.50 - 1.35 mg/dL    Calcium 8.7  8.4 - 41.6 mg/dL    Total Protein 6.4  6.0 - 8.3 g/dL    Albumin 3.3 (*) 3.5 - 5.2 g/dL    AST 606 (*) 0 - 37 U/L    ALT 98 (*) 0 - 53 U/L    Alkaline Phosphatase 90  39 - 117 U/L    Total Bilirubin 0.3  0.3 - 1.2 mg/dL    GFR calc non Af Amer 78 (*) >90 mL/min    GFR calc Af Amer >90  >90 mL/min   PROTIME-INR     Status: Abnormal   Collection Time   06/11/12 12:21 PM      Component Value Range Comment   Prothrombin Time 18.4 (*) 11.6 - 15.2 seconds  INR 1.50 (*) 0.00 - 1.49   POCT I-STAT TROPONIN I     Status: Abnormal   Collection Time   06/11/12 12:38 PM      Component Value Range Comment   Troponin i, poc 0.18 (*) 0.00 - 0.08 ng/mL    Comment NOTIFIED PHYSICIAN      Comment 3            POCT I-STAT, CHEM 8     Status: Abnormal   Collection Time   06/11/12 12:40 PM      Component Value Range Comment   Sodium 141  135 - 145 mEq/L    Potassium 3.3 (*) 3.5 - 5.1 mEq/L    Chloride 106  96 - 112 mEq/L    BUN 9  6 - 23 mg/dL    Creatinine, Ser 0.98  0.50 - 1.35 mg/dL    Glucose, Bld 119 (*) 70 - 99 mg/dL    Calcium, Ion 1.47 (*) 1.12 - 1.23 mmol/L    TCO2 15  0 - 100 mmol/L    Hemoglobin 15.0  13.0 - 17.0 g/dL    HCT 82.9  56.2 - 13.0 %   POCT ACTIVATED CLOTTING TIME     Status: Normal   Collection Time   06/11/12  1:14 PM      Component Value Range Comment   Activated Clotting Time 175     POCT I-STAT 3, BLOOD GAS (G3+)     Status: Abnormal   Collection Time   06/11/12  1:22 PM      Component Value Range Comment   pH, Arterial 7.166 (*) 7.350 - 7.450    pCO2 arterial 40.6  35.0 - 45.0 mmHg    pO2, Arterial 205.0 (*) 80.0 - 100.0 mmHg    Bicarbonate 14.7 (*) 20.0 - 24.0 mEq/L    TCO2 16  0 - 100 mmol/L    O2  Saturation 99.0      Acid-base deficit 13.0 (*) 0.0 - 2.0 mmol/L    Collection site ARTERIAL LINE      Drawn by :MD      Sample type ARTERIAL     ABO/RH     Status: Normal   Collection Time   06/11/12  1:30 PM      Component Value Range Comment   ABO/RH(D) O POS     TYPE AND SCREEN     Status: Normal (Preliminary result)   Collection Time   06/11/12  1:30 PM      Component Value Range Comment   ABO/RH(D) O POS      Antibody Screen NEG      Sample Expiration 06/14/2012      Unit Number [id number]      Blood Component Type RED CELLS,LR      Unit division 00      Status of Unit ALLOCATED      Transfusion Status OK TO TRANSFUSE      Crossmatch Result Compatible      Unit Number [id number]      Blood Component Type RED CELLS,LR      Unit division 00      Status of Unit ALLOCATED      Transfusion Status OK TO TRANSFUSE      Crossmatch Result Compatible     POCT I-STAT 3, BLOOD GAS (G3+)     Status: Abnormal   Collection Time   06/11/12  2:08 PM      Component Value Range Comment  pH, Arterial 7.334 (*) 7.350 - 7.450    pCO2 arterial 41.8  35.0 - 45.0 mmHg    pO2, Arterial 115.0 (*) 80.0 - 100.0 mmHg    Bicarbonate 22.2  20.0 - 24.0 mEq/L    TCO2 23  0 - 100 mmol/L    O2 Saturation 98.0      Acid-base deficit 4.0 (*) 0.0 - 2.0 mmol/L    Sample type ARTERIAL     POCT I-STAT 4, (NA,K, GLUC, HGB,HCT)     Status: Abnormal   Collection Time   06/11/12  3:05 PM      Component Value Range Comment   Sodium 143  135 - 145 mEq/L    Potassium 4.0  3.5 - 5.1 mEq/L    Glucose, Bld 139 (*) 70 - 99 mg/dL    HCT 16.1  09.6 - 04.5 %    Hemoglobin 13.6  13.0 - 17.0 g/dL   POCT I-STAT 3, BLOOD GAS (G3+)     Status: Abnormal   Collection Time   06/11/12  3:38 PM      Component Value Range Comment   pH, Arterial 7.344 (*) 7.350 - 7.450    pCO2 arterial 46.6 (*) 35.0 - 45.0 mmHg    pO2, Arterial 357.0 (*) 80.0 - 100.0 mmHg    Bicarbonate 25.9 (*) 20.0 - 24.0 mEq/L    TCO2 27  0 - 100 mmol/L     O2 Saturation 100.0      Acid-base deficit 1.0  0.0 - 2.0 mmol/L    Patient temperature 35.1 C      Collection site RADIAL, ALLEN'S TEST ACCEPTABLE      Drawn by Operator      Sample type ARTERIAL     POCT I-STAT 4, (NA,K, GLUC, HGB,HCT)     Status: Abnormal   Collection Time   06/11/12  4:17 PM      Component Value Range Comment   Sodium 145  135 - 145 mEq/L    Potassium 3.6  3.5 - 5.1 mEq/L    Glucose, Bld 91  70 - 99 mg/dL    HCT 40.9 (*) 81.1 - 52.0 %    Hemoglobin 12.6 (*) 13.0 - 17.0 g/dL   POCT I-STAT 3, BLOOD GAS (G3+)     Status: Abnormal   Collection Time   06/11/12  4:26 PM      Component Value Range Comment   pH, Arterial 7.256 (*) 7.350 - 7.450    pCO2 arterial 55.9 (*) 35.0 - 45.0 mmHg    pO2, Arterial 332.0 (*) 80.0 - 100.0 mmHg    Bicarbonate 24.9 (*) 20.0 - 24.0 mEq/L    TCO2 27  0 - 100 mmol/L    O2 Saturation 100.0      Acid-base deficit 2.0  0.0 - 2.0 mmol/L    Sample type ARTERIAL     POCT I-STAT 4, (NA,K, GLUC, HGB,HCT)     Status: Abnormal   Collection Time   06/11/12  4:30 PM      Component Value Range Comment   Sodium 139  135 - 145 mEq/L    Potassium 3.6  3.5 - 5.1 mEq/L    Glucose, Bld 77  70 - 99 mg/dL    HCT 91.4 (*) 78.2 - 52.0 %    Hemoglobin 8.8 (*) 13.0 - 17.0 g/dL   PREPARE FRESH FROZEN PLASMA     Status: Normal   Collection Time   06/11/12  5:37 PM  Component Value Range Comment   Unit Number [id number]      Blood Component Type THAWED PLASMA      Unit division 00      Status of Unit ISSUED,FINAL      Transfusion Status OK TO TRANSFUSE     PREPARE PLATELET PHERESIS     Status: Normal   Collection Time   06/11/12  5:37 PM      Component Value Range Comment   Unit Number [id number]      Blood Component Type PLTPHER LR2      Unit division 00      Status of Unit ISSUED,FINAL      Transfusion Status OK TO TRANSFUSE     HEMOGLOBIN AND HEMATOCRIT, BLOOD     Status: Abnormal   Collection Time   06/11/12  5:40 PM      Component  Value Range Comment   Hemoglobin 9.5 (*) 13.0 - 17.0 g/dL    HCT 57.8 (*) 46.9 - 52.0 %   PLATELET COUNT     Status: Normal   Collection Time   06/11/12  5:40 PM      Component Value Range Comment   Platelets 168  150 - 400 K/uL PLATELET COUNT CONFIRMED BY SMEAR  POCT I-STAT 4, (NA,K, GLUC, HGB,HCT)     Status: Abnormal   Collection Time   06/11/12  5:43 PM      Component Value Range Comment   Sodium 141  135 - 145 mEq/L    Potassium 4.2  3.5 - 5.1 mEq/L    Glucose, Bld 89  70 - 99 mg/dL    HCT 62.9 (*) 52.8 - 52.0 %    Hemoglobin 8.8 (*) 13.0 - 17.0 g/dL   POCT I-STAT 3, BLOOD GAS (G3+)     Status: Abnormal   Collection Time   06/11/12  6:43 PM      Component Value Range Comment   pH, Arterial 7.328 (*) 7.350 - 7.450    pCO2 arterial 45.6 (*) 35.0 - 45.0 mmHg    pO2, Arterial 82.0  80.0 - 100.0 mmHg    Bicarbonate 24.0  20.0 - 24.0 mEq/L    TCO2 25  0 - 100 mmol/L    O2 Saturation 95.0      Acid-base deficit 2.0  0.0 - 2.0 mmol/L    Sample type ARTERIAL     POCT I-STAT 4, (NA,K, GLUC, HGB,HCT)     Status: Abnormal   Collection Time   06/11/12  6:47 PM      Component Value Range Comment   Sodium 142  135 - 145 mEq/L    Potassium 3.6  3.5 - 5.1 mEq/L    Glucose, Bld 125 (*) 70 - 99 mg/dL    HCT 41.3 (*) 24.4 - 52.0 %    Hemoglobin 9.9 (*) 13.0 - 17.0 g/dL   POCT I-STAT 3, BLOOD GAS (G3+)     Status: Abnormal   Collection Time   06/11/12  7:18 PM      Component Value Range Comment   pH, Arterial 7.291 (*) 7.350 - 7.450    pCO2 arterial 52.2 (*) 35.0 - 45.0 mmHg    pO2, Arterial 40.0 (*) 80.0 - 100.0 mmHg    Bicarbonate 25.2 (*) 20.0 - 24.0 mEq/L    TCO2 27  0 - 100 mmol/L    O2 Saturation 68.0      Acid-base deficit 2.0  0.0 - 2.0 mmol/L    Sample type  ARTERIAL     MRSA PCR SCREENING     Status: Normal   Collection Time   06/11/12  8:25 PM      Component Value Range Comment   MRSA by PCR NEGATIVE  NEGATIVE   CBC     Status: Abnormal   Collection Time   06/11/12  8:31 PM       Component Value Range Comment   WBC 25.2 (*) 4.0 - 10.5 K/uL    RBC 3.57 (*) 4.22 - 5.81 MIL/uL    Hemoglobin 11.1 (*) 13.0 - 17.0 g/dL    HCT 40.9 (*) 81.1 - 52.0 %    MCV 86.0  78.0 - 100.0 fL    MCH 31.1  26.0 - 34.0 pg    MCHC 36.2 (*) 30.0 - 36.0 g/dL    RDW 91.4  78.2 - 95.6 %    Platelets 232  150 - 400 K/uL POST TRANSFUSION SPECIMEN  PROTIME-INR     Status: Abnormal   Collection Time   06/11/12  8:31 PM      Component Value Range Comment   Prothrombin Time 18.7 (*) 11.6 - 15.2 seconds    INR 1.53 (*) 0.00 - 1.49   APTT     Status: Abnormal   Collection Time   06/11/12  8:31 PM      Component Value Range Comment   aPTT 40 (*) 24 - 37 seconds   POCT I-STAT 4, (NA,K, GLUC, HGB,HCT)     Status: Abnormal   Collection Time   06/11/12  8:33 PM      Component Value Range Comment   Sodium 141  135 - 145 mEq/L    Potassium 3.6  3.5 - 5.1 mEq/L    Glucose, Bld 126 (*) 70 - 99 mg/dL    HCT 21.3 (*) 08.6 - 52.0 %    Hemoglobin 10.9 (*) 13.0 - 17.0 g/dL   GLUCOSE, CAPILLARY     Status: Abnormal   Collection Time   06/11/12  8:33 PM      Component Value Range Comment   Glucose-Capillary 134 (*) 70 - 99 mg/dL   POCT I-STAT 3, BLOOD GAS (G3+)     Status: Abnormal   Collection Time   06/11/12  8:34 PM      Component Value Range Comment   pH, Arterial 7.353  7.350 - 7.450    pCO2 arterial 45.4 (*) 35.0 - 45.0 mmHg    pO2, Arterial 76.0 (*) 80.0 - 100.0 mmHg    Bicarbonate 25.4 (*) 20.0 - 24.0 mEq/L    TCO2 27  0 - 100 mmol/L    O2 Saturation 95.0      Acid-base deficit 1.0  0.0 - 2.0 mmol/L    Patient temperature 36.5 C      Sample type ARTERIAL     GLUCOSE, CAPILLARY     Status: Abnormal   Collection Time   06/11/12 10:21 PM      Component Value Range Comment   Glucose-Capillary 136 (*) 70 - 99 mg/dL   CK TOTAL AND CKMB     Status: Abnormal   Collection Time   06/11/12 11:00 PM      Component Value Range Comment   Total CK 4448 (*) 7 - 232 U/L    CK, MB 123.9 (*) 0.3 - 4.0 ng/mL     Relative Index 2.8 (*) 0.0 - 2.5   TROPONIN I     Status: Abnormal   Collection Time  06/11/12 11:00 PM      Component Value Range Comment   Troponin I >25.00 (*) <0.30 ng/mL   GLUCOSE, CAPILLARY     Status: Abnormal   Collection Time   06/11/12 11:08 PM      Component Value Range Comment   Glucose-Capillary 149 (*) 70 - 99 mg/dL   GLUCOSE, CAPILLARY     Status: Abnormal   Collection Time   06/11/12 11:59 PM      Component Value Range Comment   Glucose-Capillary 148 (*) 70 - 99 mg/dL   GLUCOSE, CAPILLARY     Status: Abnormal   Collection Time   06/12/12  1:01 AM      Component Value Range Comment   Glucose-Capillary 136 (*) 70 - 99 mg/dL   GLUCOSE, CAPILLARY     Status: Abnormal   Collection Time   06/12/12  2:06 AM      Component Value Range Comment   Glucose-Capillary 129 (*) 70 - 99 mg/dL   GLUCOSE, CAPILLARY     Status: Abnormal   Collection Time   06/12/12  2:56 AM      Component Value Range Comment   Glucose-Capillary 118 (*) 70 - 99 mg/dL   POCT I-STAT 3, BLOOD GAS (G3+)     Status: Abnormal   Collection Time   06/12/12  2:59 AM      Component Value Range Comment   pH, Arterial 7.428  7.350 - 7.450    pCO2 arterial 43.4  35.0 - 45.0 mmHg    pO2, Arterial 385.0 (*) 80.0 - 100.0 mmHg    Bicarbonate 28.6 (*) 20.0 - 24.0 mEq/L    TCO2 30  0 - 100 mmol/L    O2 Saturation 100.0      Acid-Base Excess 4.0 (*) 0.0 - 2.0 mmol/L    Patient temperature 37.2 C      Sample type ARTERIAL     GLUCOSE, CAPILLARY     Status: Abnormal   Collection Time   06/12/12  4:02 AM      Component Value Range Comment   Glucose-Capillary 139 (*) 70 - 99 mg/dL   CBC     Status: Abnormal   Collection Time   06/12/12  5:00 AM      Component Value Range Comment   WBC 13.1 (*) 4.0 - 10.5 K/uL    RBC 2.91 (*) 4.22 - 5.81 MIL/uL    Hemoglobin 8.8 (*) 13.0 - 17.0 g/dL DELTA CHECK NOTED   HCT 24.9 (*) 39.0 - 52.0 %    MCV 85.6  78.0 - 100.0 fL    MCH 30.2  26.0 - 34.0 pg    MCHC 35.3  30.0 - 36.0 g/dL     RDW 45.4  09.8 - 11.9 %    Platelets 173  150 - 400 K/uL DELTA CHECK NOTED  MAGNESIUM     Status: Normal   Collection Time   06/12/12  5:00 AM      Component Value Range Comment   Magnesium 2.4  1.5 - 2.5 mg/dL   CK TOTAL AND CKMB     Status: Abnormal   Collection Time   06/12/12  5:00 AM      Component Value Range Comment   Total CK 4368 (*) 7 - 232 U/L    CK, MB 80.4 (*) 0.3 - 4.0 ng/mL CRITICAL VALUE NOTED.  VALUE IS CONSISTENT WITH PREVIOUSLY REPORTED AND CALLED VALUE.   Relative Index 1.8  0.0 - 2.5  COMPREHENSIVE METABOLIC PANEL     Status: Abnormal   Collection Time   06/12/12  5:00 AM      Component Value Range Comment   Sodium 139  135 - 145 mEq/L    Potassium 3.9  3.5 - 5.1 mEq/L    Chloride 103  96 - 112 mEq/L    CO2 27  19 - 32 mEq/L    Glucose, Bld 177 (*) 70 - 99 mg/dL    BUN 14  6 - 23 mg/dL    Creatinine, Ser 0.98 (*) 0.50 - 1.35 mg/dL    Calcium 8.5  8.4 - 11.9 mg/dL    Total Protein 5.2 (*) 6.0 - 8.3 g/dL    Albumin 3.6  3.5 - 5.2 g/dL    AST 147 (*) 0 - 37 U/L    ALT 54 (*) 0 - 53 U/L    Alkaline Phosphatase 47  39 - 117 U/L    Total Bilirubin 0.7  0.3 - 1.2 mg/dL    GFR calc non Af Amer 49 (*) >90 mL/min    GFR calc Af Amer 57 (*) >90 mL/min   GLUCOSE, CAPILLARY     Status: Abnormal   Collection Time   06/12/12  5:03 AM      Component Value Range Comment   Glucose-Capillary 178 (*) 70 - 99 mg/dL   POCT I-STAT 3, BLOOD GAS (G3+)     Status: Abnormal   Collection Time   06/12/12  5:06 AM      Component Value Range Comment   pH, Arterial 7.400  7.350 - 7.450    pCO2 arterial 42.9  35.0 - 45.0 mmHg    pO2, Arterial 110.0 (*) 80.0 - 100.0 mmHg    Bicarbonate 26.5 (*) 20.0 - 24.0 mEq/L    TCO2 28  0 - 100 mmol/L    O2 Saturation 98.0      Acid-Base Excess 2.0  0.0 - 2.0 mmol/L    Patient temperature 37.2 C      Sample type ARTERIAL     GLUCOSE, CAPILLARY     Status: Abnormal   Collection Time   06/12/12  5:59 AM      Component Value Range Comment    Glucose-Capillary 142 (*) 70 - 99 mg/dL   POCT ACTIVATED CLOTTING TIME     Status: Normal   Collection Time   06/12/12  8:38 AM      Component Value Range Comment   Activated Clotting Time 95       Imaging: Imaging results have been reviewed  Assessment/Plan:   1. Active Problems: 2.  STEMI (ST elevation myocardial infarction) 3.  Tobacco abuse 4.  HLD (hyperlipidemia) 5.   Time Spent Directly with Patient:  30 minutes  Length of Stay:  LOS: 1 day   POD # 1 emergent CABG X 4 for LM/ 3 VD and moderate LVD in setting or wittmessed SCD with prompt resuscitation. IABP placed and is currently 1:1. On dop, milrinone. Trop > 25, CK MB 80. Amazingly pt is alert and responsive. Will need to start BB/ ACE-I, statin etc... once extubated. Appreciate Dr. Zenaida Niece Trigt's excellent and prompt care!!!  Runell Gess 06/12/2012, 9:04 AM

## 2012-06-12 NOTE — Progress Notes (Signed)
[facility] BRIEF SICU PROGRESS NOTE  1 Day Post-Op  S/P Procedure(s) (LRB): CORONARY ARTERY BYPASS GRAFTING (CABG) ([name])   Sedated on vent AAI paced MAP stable 65 w/ IABP 1:1, Dop @ 4, Levophed @ 6 and Milrinone @ 0.2 UOP adequate  Plan: Continue current plan.  Keep on vent overnight.  [name],CLARENCE H 06/12/2012 6:41 PM

## 2012-06-12 NOTE — Progress Notes (Signed)
6 fr sheath removed from right groin. Pressure held for 20 minutes. Tegaderm applied.  Dressing clean, dry and intact.

## 2012-06-12 NOTE — Op Note (Signed)
Jonathon Ryan, Jonathon Ryan NO.:  [id number]  MEDICAL RECORD NO.:  [id number]  LOCATION:  2305                         FACILITY:  [facility]  PHYSICIAN:  [name], M.D.  DATE OF BIRTH:  11-07-1955  DATE OF PROCEDURE:  06/11/2012 DATE OF DISCHARGE:                              OPERATIVE REPORT   OPERATION: 1. Emergency coronary artery bypass grafting x4 (left internal mammary     artery to left anterior descending, saphenous vein graft to     posterior descending, sequential saphenous vein graft to obtuse     marginal 1 and obtuse marginal 2). 2. Endoscopic harvest of right leg greater saphenous vein.  PREOPERATIVE DIAGNOSES: 1. Acute myocardial infarction with ventricular fibrillation arrest     requiring CPR and intubation. 2. Severe left main and three-vessel coronary disease with severe left     ventricular dysfunction. 3. Preoperative intra-aortic balloon pump.  POSTOPERATIVE DIAGNOSES: 1. Acute myocardial infarction with ventricular fibrillation arrest     requiring CPR and intubation. 2. Severe left main and three-vessel coronary disease with severe left     ventricular dysfunction. 3. Preoperative intra-aortic balloon pump.  SURGEON:  [name], M.D.  ASSISTANT:  [name], P.A.-C.  ANESTHESIA:  General by Dr. [name].  INDICATIONS:  The patient is a 56 year old Caucasian male smoker without prior history of heart disease who had sudden loss of consciousness while at his job and was found to be in VFib arrest requiring CPR, cardioversion by EMTs, and subsequent intubation by EMTs.  He was brought to the emergency department and then directly to the cardiac cath lab.  His chest x-ray showed pulmonary edema.  Cardiac catheterization demonstrated severe left main and multivessel disease with chronic occlusion of the right coronary.  The patient had a balloon pump placed via the left femoral artery and the cardiac cath lab and surgical  consultation was requested.  I examined the patient in the cardiac cath lab, and reviewed the results with cardiac catheterization films with Dr. [name].  I agree with recommendation for emergency multivessel bypass grafting, and the patient was prepared for surgery. I discussed the situation with the patient's wife, including discussion of coronary artery bypass grafting as the best treatment for his severe coronary artery disease, recent MI, and cardiogenic shock.  The patient's wife understood he was critically ill, and the operation had significant risk, but would give him the best chance for survival. Informed consent was obtained from the wife.  OPERATIVE FINDINGS: 1. Good quality conduit. 2. Severe chronic lung disease. 3. Global biventricular dysfunction upon entering the chest with     improved biventricular function after coming off bypass following     revascularization.  PROCEDURE:  The patient was brought to the operating room, placed supine on the operating table where general anesthesia was induced.  Invasive monitoring lines were placed as well as the transesophageal echo probe. The patient was found to have global LV dysfunction with EF of 15% to 20%.  There was 2+ mitral regurgitation.  The patient remained with a stable blood pressure.  The patient was then prepped and draped as a sterile field.  A  proper time-out was performed.  A sternal incision was made and the internal mammary artery was harvested.  The saphenous vein was harvested endoscopically from the right leg.  Sternal retractor was placed and the pericardium was opened.  Pursestrings were placed in the ascending aorta and right atrium and the patient was heparinized.  The patient was cannulated and placed on cardiopulmonary bypass.  The coronary arteries were identified for grafting.  The inferior wall of the LV had old scar from an old VMI.  There was some edema of the anterior and lateral wall of the  LV, which was hypocontractile.  Cardioplegia cannulas were placed for both antegrade and retrograde cold blood cardioplegia and the patient was cooled to 32 degrees.  The aortic crossclamp was applied and 1 L of cold blood cardioplegia was delivered in split doses between the antegrade aortic and retrograde coronary sinus catheters.  There was good cardioplegic arrest and septal temperature dropped less than 14 degrees.  Cardioplegia was delivered at every 20 minutes.  The distal coronary anastomosis was then performed.  The first distal anastomosis was the posterior descending branch of the right coronary. This was chronically occluded.  This was a 1.5-mm vessel.  A reverse saphenous vein was sewn end-to-side with running 7-0 Prolene with good flow through the graft.  The 2nd and 3rd distal anastomoses consisted of a sequential vein graft to the OM 1 and the OM 2.  The OM 1 was a larger 1.75 mm vessel with proximal 80% stenosis.  A side-to-side anastomosis with running 7-0 Prolene was created with good flow through the graft. The third distal anastomosis was continuation of the sequential vein to the OM 2.  The OM 2 is a smaller 1.4-mm vessel with proximal left main stenosis.  The end of the vein was sewn end-to-side with running 7-0 Prolene with good flow through the graft.  Cardioplegia was redosed.  The 4th distal anastomosis was to the mid LAD.  This was 1.5-mm vessel and a proximal 90% left main stenosis.  The left IMA pedicle was brought through an opening created in left lateral pericardium, was brought down onto the LAD and sewn end-to-side with running 8-0 Prolene.  There was good flow through the anastomosis after briefly releasing the pedicle bulldog and the mammary artery.  The bulldog was reapplied and the pedicle secured to the epicardium.  While the crossclamp was still in place, 2 proximal vein anastomoses were performed on the ascending aorta using a 4.0 mm punch  running 7-0 Prolene.  Prior to tying down the final proximal anastomosis, air was vented from the coronaries with a dose of retrograde warm blood cardioplegia.  The crossclamp was removed.  The heart resumed a spontaneous rhythm, but then needed cardioversion for VFib.  The patient was started on a lidocaine drip.  Grafts were open, each had good flow.  Hemostasis was documented at the proximal and distal sites.  The patient was rewarmed to 37 degrees and temporary pacing wires were applied.  Low-dose dopamine and milrinone were started and the balloon was started at a 1:1 ratio.  After adequate reperfusion rewarming, lungs were expanded, ventilator was resumed.  The patient was weaned off bypass with little difficulty.  Echo showed baseline 2+ MR. LV global function was significantly improved.  Protamine was administered without adverse reaction.  The cannula was removed.  There was diffuse coagulopathy and the patient treated with FFP and platelets with improved coagulation function.  The superior pericardial fat was closed  over the aorta and bilateral pleural tubes were placed and anterior and posterior mediastinal chest tubes were placed.  The sternum was closed with wire.  The pectoralis fascia was closed in running #1 Vicryl.  Subcutaneous and skin layers were closed in running Vicryl and sterile dressings were applied.  Total cardiopulmonary bypass time was 120 minutes.     [name], M.D.     PV/MEDQ  D:  06/11/2012  T:  06/12/2012  Job:  [id number]

## 2012-06-12 NOTE — Progress Notes (Signed)
INITIAL ADULT NUTRITION ASSESSMENT Date: 06/12/2012   Time: 3:28 PM  INTERVENTION:  If EN started, recommend initiation of Jevity 1.2 formula at 15 ml/hr and increase by 10 ml every 4 hours to goal rate of 65 ml/hr with Prostat liquid protein 30 ml twice daily via tube to provide 2072 total kcals, 117 gm protein, 1259 ml of free water RD to follow for nutrition care plan  Reason for Assessment: VDRF  ASSESSMENT: Male 56 y.o.  Dx: Cardiac arrest - ventricular fibrillation  Hx:  Past Medical History  Diagnosis Date  . No pertinent past medical history     Related Meds:     . acetaminophen (TYLENOL) oral liquid 160 mg/5 mL  650 mg Per Tube NOW   Or  . acetaminophen  650 mg Rectal NOW  . acetaminophen  1,000 mg Oral Q6H   Or  . acetaminophen (TYLENOL) oral liquid 160 mg/5 mL  975 mg Per Tube Q6H  . antiseptic oral rinse  15 mL Mouth Rinse QID  . aspirin EC  325 mg Oral Daily   Or  . aspirin  324 mg Per Tube Daily  . bisacodyl  10 mg Oral Daily   Or  . bisacodyl  10 mg Rectal Daily  . budesonide  0.5 mg Nebulization BID  . cefUROXime (ZINACEF)  IV  1.5 g Intravenous Q12H  . chlorhexidine  15 mL Mouth Rinse BID  . chlorhexidine      . docusate sodium  200 mg Oral Daily  . EPINEPHrine      . famotidine (PEPCID) IV  20 mg Intravenous Q12H  . furosemide  20 mg Intravenous BID  . insulin aspart  0-24 Units Subcutaneous Q4H  . insulin glargine  12 Units Subcutaneous Daily  . insulin regular  0-10 Units Intravenous TID WC  . levalbuterol  0.63 mg Nebulization Q6H  . LORazepam      . magnesium sulfate  4 g Intravenous Once  . metoprolol tartrate  12.5 mg Oral BID   Or  . metoprolol tartrate  12.5 mg Per Tube BID  . pantoprazole  40 mg Oral Q1200  . pneumococcal 23 valent vaccine  0.5 mL Intramuscular Once  . potassium chloride  10 mEq Intravenous Q1 Hr x 3  . potassium chloride  10 mEq Intravenous Once  . sodium chloride  3 mL Intravenous Q12H  . vancomycin  1,000 mg  Intravenous Q12H  . DISCONTD: levalbuterol  1.25 mg Nebulization TID  . DISCONTD: lidocaine  1 mg/min Intravenous To OR  . DISCONTD: lidocaine  1 mg/min Intravenous To OR  . DISCONTD: lidocaine  1 mg/min Intravenous To OR  . DISCONTD: propofol  5-70 mcg/kg/min Intravenous Once    Ht: 5\' 10"  (177.8 cm)  Wt: 163 lb 2.3 oz (74 kg)  Ideal Wt: 75.4 kg % Ideal Wt: 98%  Usual Wt: unable to obtain % Usual Wt: ---  Body mass index is 23.41 kg/(m^2).  Food/Nutrition Related Hx: no triggers per admission nutrition screen  Labs:  CMP     Component Value Date/Time   NA 139 06/12/2012 0500   K 3.9 06/12/2012 0500   CL 103 06/12/2012 0500   CO2 27 06/12/2012 0500   GLUCOSE 177* 06/12/2012 0500   BUN 14 06/12/2012 0500   CREATININE 1.53* 06/12/2012 0500   CALCIUM 8.5 06/12/2012 0500   PROT 5.2* 06/12/2012 0500   ALBUMIN 3.6 06/12/2012 0500   AST 151* 06/12/2012 0500   ALT 54* 06/12/2012 0500  ALKPHOS 47 06/12/2012 0500   BILITOT 0.7 06/12/2012 0500   GFRNONAA 49* 06/12/2012 0500   GFRAA 57* 06/12/2012 0500     Intake/Output Summary (Last 24 hours) at 06/12/12 1532 Last data filed at 06/12/12 1500  Gross per 24 hour  Intake 9061.57 ml  Output  16109 ml  Net -2043.43 ml    CBG (last 3)   Basename 06/12/12 0559 06/12/12 0503 06/12/12 0402  GLUCAP 142* 178* 139*    Diet Order: NPO  Supplements/Tube Feeding: [name]  IVF:    sodium chloride Last Rate: 20 mL/hr at 06/12/12 1500  sodium chloride Last Rate: 10 mL/hr at 06/12/12 1400  sodium chloride Last Rate: 10 mL/hr at 06/12/12 1500  sodium chloride Last Rate: 250 mL (06/12/12 1500)  dexmedetomidine Last Rate: 0.699 mcg/kg/hr (06/12/12 1500)  DOPamine Last Rate: 3.982 mcg/kg/min (06/12/12 1500)  insulin (NOVOLIN-R) infusion Last Rate: Stopped (06/12/12 1200)  milrinone Last Rate: 0.302 mcg/kg/min (06/12/12 1500)  norepinephrine (LEVOPHED) Adult infusion Last Rate: 5 mcg/min (06/12/12 1500)  DISCONTD: sodium chloride   DISCONTD: lactated ringers  Last Rate: 20 mL/hr at 06/12/12 0800  DISCONTD: nitroGLYCERIN Last Rate: Stopped (06/11/12 2100)  DISCONTD: phenylephrine (NEO-SYNEPHRINE) Adult infusion Last Rate: 5 mcg/min (06/12/12 0900)    Estimated Nutritional Needs:   Kcal: 1950-2050 Protein: 110-120 gm Fluid: 1.9-2.0 L  Patient is currently intubated on ventilator support MV: 9.2 Temp: 38.1  S/p emergent CABG 9/4; OGT in place; + Precedex & Dopamine drip.  NUTRITION DIAGNOSIS: -Inadequate oral intake (NI-2.1).  Status: Ongoing  RELATED TO: inability to eat  AS EVIDENCE BY: NPO status  MONITORING/EVALUATION(Goals): Goal: Initiate EN support in next 24 hours if extubation not expected Monitor: EN initiation, respiratory status, weight, labs, I/O's  EDUCATION NEEDS: -No education needs identified at this time  Dietitian #: [phone]  DOCUMENTATION CODES Per approved criteria  -Not Applicable    Alger Memos 06/12/2012, 3:28 PM

## 2012-06-12 NOTE — Progress Notes (Signed)
1 Day Post-Op Procedure(s) (LRB): CORONARY ARTERY BYPASS GRAFTING (CABG) ([name]) Subjective: Intubated, IABP 1:! Low dose MIL,Dopa Good U/O  Objective: Vital signs in last 24 hours: Temp:  [97.5 F (36.4 C)-99.5 F (37.5 C)] 99.5 F (37.5 C) (09/05 0800) Pulse Rate:  [88-152] 90  (09/05 0800) Cardiac Rhythm:  [-] Sinus tachycardia (09/05 0800) Resp:  [7-30] 14  (09/05 0800) BP: (62-147)/(24-88) 105/37 mmHg (09/05 0800) SpO2:  [95 %-100 %] 100 % (09/05 0800) Arterial Line BP: (92-130)/(34-69) 104/49 mmHg (09/05 0800) FiO2 (%):  [40 %-100 %] 40 % (09/05 0800) Weight:  [163 lb 2.3 oz (74 kg)-165 lb 5.5 oz (75 kg)] 163 lb 2.3 oz (74 kg) (09/05 0500)  Hemodynamic parameters for last 24 hours: PAP: (20-27)/(10-17) 25/14 mmHg CO:  [3 L/min-4.5 L/min] 4.2 L/min CI:  [1.6 L/min/m2-2.3 L/min/m2] 2.2 L/min/m2  Intake/Output from previous day: 09/04 0701 - 09/05 0700 In: 7599.1 [I.V.:4986.1; Blood:1093; IV Piggyback:1520] Out: 9350 [Urine:8760; Chest Tube:590] Intake/Output this shift: Total I/O In: 356.8 [I.V.:356.8] Out: 60 [Urine:30; Chest Tube:30]  Warm extrem  Lab Results:  Basename 06/12/12 0500 06/11/12 2033 06/11/12 2031  WBC 13.1* -- 25.2*  HGB 8.8* 10.9* --  HCT 24.9* 32.0* --  PLT 173 -- 232   BMET:  Basename 06/12/12 0500 06/11/12 2033 06/11/12 1240 06/11/12 1221  NA 139 141 -- --  K 3.9 3.6 -- --  CL 103 -- 106 --  CO2 27 -- -- 12*  GLUCOSE 177* 126* -- --  BUN 14 -- 9 --  CREATININE 1.53* -- 1.20 --  CALCIUM 8.5 -- -- 8.7    PT/INR:  Basename 06/11/12 2031  LABPROT 18.7*  INR 1.53*   ABG    Component Value Date/Time   PHART 7.400 06/12/2012 0506   HCO3 26.5* 06/12/2012 0506   TCO2 28 06/12/2012 0506   ACIDBASEDEF 1.0 06/11/2012 2034   O2SAT 98.0 06/12/2012 0506   CBG (last 3)   Basename 06/12/12 0559 06/12/12 0503 06/12/12 0402  GLUCAP 142* 178* 139*    Assessment/Plan: S/P Procedure(s) (LRB): CORONARY ARTERY BYPASS GRAFTING (CABG) ([name]) Day of  stability leave current level support   LOS: 1 day    Jonathon Ryan,Jonathon Ryan 06/12/2012

## 2012-06-13 ENCOUNTER — Inpatient Hospital Stay (HOSPITAL_COMMUNITY): Payer: BC Managed Care – PPO

## 2012-06-13 LAB — POCT I-STAT 3, ART BLOOD GAS (G3+)
Acid-Base Excess: 2 mmol/L (ref 0.0–2.0)
Acid-Base Excess: 3 mmol/L — ABNORMAL HIGH (ref 0.0–2.0)
Bicarbonate: 25.2 mEq/L — ABNORMAL HIGH (ref 20.0–24.0)
Bicarbonate: 27.1 mEq/L — ABNORMAL HIGH (ref 20.0–24.0)
Patient temperature: 37.7
Patient temperature: 37.5
TCO2: 26 mmol/L (ref 0–100)
pCO2 arterial: 40.8 mmHg (ref 35.0–45.0)
pH, Arterial: 7.405 (ref 7.350–7.450)
pO2, Arterial: 108 mmHg — ABNORMAL HIGH (ref 80.0–100.0)
pO2, Arterial: 104 mmHg — ABNORMAL HIGH (ref 80.0–100.0)
pO2, Arterial: 119 mmHg — ABNORMAL HIGH (ref 80.0–100.0)

## 2012-06-13 LAB — GLUCOSE, CAPILLARY
Glucose-Capillary: 121 mg/dL — ABNORMAL HIGH (ref 70–99)
Glucose-Capillary: 173 mg/dL — ABNORMAL HIGH (ref 70–99)
Glucose-Capillary: 118 mg/dL — ABNORMAL HIGH (ref 70–99)
Glucose-Capillary: 112 mg/dL — ABNORMAL HIGH (ref 70–99)

## 2012-06-13 LAB — CBC
HCT: 26.5 % — ABNORMAL LOW (ref 39.0–52.0)
Hemoglobin: 9.3 g/dL — ABNORMAL LOW (ref 13.0–17.0)
MCH: 30.4 pg (ref 26.0–34.0)
MCHC: 35.1 g/dL (ref 30.0–36.0)
MCV: 86.6 fL (ref 78.0–100.0)
Platelets: 121 10*3/uL — ABNORMAL LOW (ref 150–400)
RBC: 3.06 MIL/uL — ABNORMAL LOW (ref 4.22–5.81)
RDW: 12.6 % (ref 11.5–15.5)
WBC: 10.1 10*3/uL (ref 4.0–10.5)

## 2012-06-13 LAB — BASIC METABOLIC PANEL
BUN: 20 mg/dL (ref 6–23)
CO2: 27 mEq/L (ref 19–32)
Calcium: 8.7 mg/dL (ref 8.4–10.5)
Chloride: 100 mEq/L (ref 96–112)
Creatinine, Ser: 2 mg/dL — ABNORMAL HIGH (ref 0.50–1.35)
GFR calc Af Amer: 41 mL/min — ABNORMAL LOW (ref >90)
GFR calc non Af Amer: 35 mL/min — ABNORMAL LOW (ref >90)
Glucose, Bld: 167 mg/dL — ABNORMAL HIGH (ref 70–99)
Potassium: 3.8 mEq/L (ref 3.5–5.1)
Sodium: 137 mEq/L (ref 135–145)

## 2012-06-13 MED ORDER — FUROSEMIDE 10 MG/ML IJ SOLN
20.0000 mg | Freq: Two times a day (BID) | INTRAMUSCULAR | Status: DC
  Administered 2012-06-13 – 2012-06-15 (×5): 20 mg via INTRAVENOUS
  Filled 2012-06-13 (×6): qty 2

## 2012-06-13 MED ORDER — DIGOXIN 250 MCG PO TABS
0.2500 mg | ORAL_TABLET | Freq: Every day | ORAL | Status: DC
  Administered 2012-06-14 – 2012-06-18 (×5): 0.25 mg via ORAL
  Filled 2012-06-13 (×5): qty 1

## 2012-06-13 MED ORDER — LIDOCAINE HCL (PF) 1 % IJ SOLN: 10.0000 mL | Freq: Once | INTRAMUSCULAR | Status: DC

## 2012-06-13 MED ORDER — ALPRAZOLAM 0.5 MG PO TABS
1.0000 mg | ORAL_TABLET | Freq: Every day | ORAL | Status: DC
  Administered 2012-06-13 – 2012-06-17 (×5): 1 mg via ORAL
  Filled 2012-06-13: qty 2
  Filled 2012-06-13 (×2): qty 1
  Filled 2012-06-13 (×3): qty 2

## 2012-06-13 MED ORDER — ALPRAZOLAM 0.5 MG PO TABS
1.0000 mg | ORAL_TABLET | Freq: Once | ORAL | Status: AC
  Administered 2012-06-13: 1 mg via ORAL
  Filled 2012-06-13: qty 2

## 2012-06-13 MED ORDER — NITROGLYCERIN IN D5W 200-5 MCG/ML-% IV SOLN: 0.0000 ug/min | INTRAVENOUS | Status: DC

## 2012-06-13 MED ORDER — NICOTINE 21 MG/24HR TD PT24
21.0000 mg | MEDICATED_PATCH | Freq: Every day | TRANSDERMAL | Status: DC
  Administered 2012-06-13 – 2012-06-18 (×6): 21 mg via TRANSDERMAL
  Filled 2012-06-13 (×6): qty 1

## 2012-06-13 MED ORDER — TRAMADOL HCL 50 MG PO TABS
50.0000 mg | ORAL_TABLET | Freq: Four times a day (QID) | ORAL | Status: DC | PRN
  Filled 2012-06-13: qty 1

## 2012-06-13 MED ORDER — DIGOXIN 0.25 MG/ML IJ SOLN
0.5000 mg | Freq: Once | INTRAMUSCULAR | Status: AC
  Administered 2012-06-13: 0.5 mg via INTRAVENOUS
  Filled 2012-06-13: qty 2

## 2012-06-13 MED ORDER — LIDOCAINE HCL (PF) 1 % IJ SOLN
INTRAMUSCULAR | Status: AC
  Administered 2012-06-13: 10 mL
  Filled 2012-06-13: qty 10

## 2012-06-13 MED ORDER — NITROGLYCERIN IN D5W 200-5 MCG/ML-% IV SOLN
INTRAVENOUS | Status: AC
  Administered 2012-06-13: 30 ug/min
  Filled 2012-06-13: qty 250

## 2012-06-13 MED FILL — Sodium Chloride Irrigation Soln 0.9%: Qty: 3000 | Status: AC

## 2012-06-13 MED FILL — Heparin Sodium (Porcine) Inj 1000 Unit/ML: INTRAMUSCULAR | Qty: 10 | Status: AC

## 2012-06-13 MED FILL — Sodium Chloride IV Soln 0.9%: INTRAVENOUS | Qty: 1000 | Status: AC

## 2012-06-13 MED FILL — Lidocaine HCl IV Inj 20 MG/ML: INTRAVENOUS | Qty: 10 | Status: AC

## 2012-06-13 MED FILL — Electrolyte-R (PH 7.4) Solution: INTRAVENOUS | Qty: 6000 | Status: AC

## 2012-06-13 MED FILL — Mannitol IV Soln 20%: INTRAVENOUS | Qty: 500 | Status: AC

## 2012-06-13 MED FILL — Sodium Bicarbonate IV Soln 8.4%: INTRAVENOUS | Qty: 50 | Status: AC

## 2012-06-13 MED FILL — Calcium Chloride Inj 10%: INTRAVENOUS | Qty: 10 | Status: AC

## 2012-06-13 MED FILL — Heparin Sodium (Porcine) Inj 1000 Unit/ML: INTRAMUSCULAR | Qty: 30 | Status: AC

## 2012-06-13 NOTE — Procedures (Signed)
Extubation Procedure Note  Patient Details:   Name: Jonathon Ryan DOB: 1956-01-13 MRN: [id number]   Airway Documentation:  Airway 8 mm (Active)  Secured at (cm) 24 cm 06/13/2012  6:36 AM  Measured From Lips 06/13/2012  6:36 AM  Secured Location Right 06/13/2012  6:36 AM  Secured By Caron Presume Tape 06/13/2012  6:36 AM  Cuff Pressure (cm H2O) 24 cm H2O 06/12/2012  8:32 AM  Site Condition Dry 06/12/2012  3:12 PM    Evaluation  O2 sats: stable throughout and currently acceptable Complications: No apparent complications Patient did tolerate procedure well. Bilateral Breath Sounds: Clear Suctioning: Airway Yes  Cuff leak noted pre-extubation.  Pt able to state his name post-extubation.  No stridor noted.  Antoine Poche 06/13/2012, 08:06 AM

## 2012-06-13 NOTE — Progress Notes (Addendum)
Nursing Note  Called by Dr. Donata Clay, given updates on patient. IABP now 1:2 and starting rapid wean protocol per telephone verbal order. Patient alert and follows all commands, updated on plan of care.  Dorette Grate RN

## 2012-06-13 NOTE — CV Procedure (Signed)
Removal of IABP from left groin. Manual compression was applied without complication. Total hold time 30 minutes. Distal pulses present and unchanged from prior assessment. Sterile dressing applied to puncture site. Upon departure from pateint room. Nurse present and assessed left groin.Pateint given post instructions

## 2012-06-13 NOTE — Care Management Note (Unsigned)
    Page 1 of 1   06/18/2012     10:13:51 AM   CARE MANAGEMENT NOTE 06/18/2012  Patient:  Jonathon Ryan, Jonathon Ryan   Account Number:  [id number]  Date Initiated:  06/13/2012  Documentation initiated by:  AMERSON,JULIE  Subjective/Objective Assessment:   PT S/P ACUTE MI WITH VFIB ARREST AND CABG X 4 ON 06/11/12. PTA, PT INDEPENDENT, LIVES WITH SPOUSE.  HE IS STILL WORKING FULL TIME.     Action/Plan:   MET WITH PT AND FAMILY TO DISCUSS DC PLANS. WIFE TO PROVIDE 24HR CARE AT DISCHARGE.  WILL FOLLOW FOR HOME NEEDS AS PT PROGRESSES.   Anticipated DC Date:  06/18/2012   Anticipated DC Plan:  HOME W HOME HEALTH SERVICES      DC Planning Services  CM consult      Choice offered to / List presented to:             Status of service:  In process, will continue to follow Medicare Important Message given?   (If response is "NO", the following Medicare IM given date fields will be blank) Date Medicare IM given:   Date Additional Medicare IM given:    Discharge Disposition:    Per UR Regulation:  Reviewed for med. necessity/level of care/duration of stay  If discussed at Long Length of Stay Meetings, dates discussed:    Comments:  06/18/12  1010  Jonathon Chiarelli SIMMONS RN, BSN [phone] PT WILL NEED LIFEV[facility] FOR D/C; PER PA- LUKE WILL CONTACT ASHLEY WITH [facility]. NCM WILL FOLLOW.

## 2012-06-13 NOTE — Progress Notes (Signed)
2 Days Post-Op Procedure(s) (LRB): CORONARY ARTERY BYPASS GRAFTING (CABG) ([name]) Subjective:                     [address]            [place] [phone]          [phone]      Postop day 2 emergency CABG x4 for V. fib arrest left main stenosis severe LV dysfunction Patient extubated today Hemodynamics remained stable, balloon pump augmentation decreased 1-2, will wean to 1-3 late a.m. and hopefully remove later today Neurologically intact Chest x-ray shows some atelectasis, edema Adequate urine output Norepinephrine drip weaned off continuing low-dose dopamine and milrinone  Objective: Vital signs in last 24 hours: Temp:  [98.6 F (37 C)-100.8 F (38.2 C)] 98.6 F (37 C) (09/06 1000) Pulse Rate:  [46-137] 104  (09/06 1000) Cardiac Rhythm:  [-] Normal sinus rhythm (09/06 1000) Resp:  [12-23] 22  (09/06 1000) BP: (73-121)/(36-73) 109/58 mmHg (09/06 1000) SpO2:  [96 %-100 %] 100 % (09/06 1000) Arterial Line BP: (82-129)/(42-63) 114/63 mmHg (09/06 1000) FiO2 (%):  [40 %] 40 % (09/06 0636) Weight:  [162 lb 14.7 oz (73.9 kg)] 162 lb 14.7 oz (73.9 kg) (09/06 0600)  Hemodynamic parameters for last 24 hours: PAP: (21-30)/(11-19) 28/19 mmHg CO:  [4 L/min-5.8 L/min] 4.6 L/min CI:  [2.1 L/min/m2-3 L/min/m2] 2.4 L/min/m2  Intake/Output from previous day: 09/05 0701 - 09/06 0700 In: 3250.1 [I.V.:2511.1; Blood:335; NG/GT:150; IV Piggyback:254] Out: 4125 [Urine:2795; Emesis/NG output:900; Chest Tube:430] Intake/Output this shift: Total I/O In: 216.1 [I.V.:214.1; IV Piggyback:2] Out: 775 [Urine:735; Chest Tube:40]  Exam Alert and oriented no motor deficit No airleak from chest tubes Extremities warm Sinus rhythm  Lab Results:  Basename 06/13/12 0335 06/12/12 1650 06/12/12 1645  WBC 10.1 -- 12.4*  HGB 9.3* 9.2* --  HCT 26.5* 27.0* --  PLT 121* -- 134*   BMET:  Basename 06/13/12 0335 06/12/12 1650 06/12/12 0500  NA 137 139 --  K 3.8 3.7 --  CL 100  100 --  CO2 27 -- 27  GLUCOSE 167* 193* --  BUN 20 16 --  CREATININE 2.00* 1.90* --  CALCIUM 8.7 -- 8.5    PT/INR:  Basename 06/11/12 2031  LABPROT 18.7*  INR 1.53*   ABG    Component Value Date/Time   PHART 7.430 06/13/2012 0913   HCO3 27.1* 06/13/2012 0913   TCO2 28 06/13/2012 0913   ACIDBASEDEF 1.0 06/11/2012 2034   O2SAT 99.0 06/13/2012 0913   CBG (last 3)   Basename 06/13/12 0741 06/13/12 0427 06/12/12 2327  GLUCAP 118* 173* 121*    Assessment/Plan: S/P Procedure(s) (LRB): CORONARY ARTERY BYPASS GRAFTING (CABG) ([name]) Continue foley due to patient in ICU See progression orders   LOS: 2 days    VAN TRIGT III,PETER 06/13/2012

## 2012-06-13 NOTE — Progress Notes (Signed)
Wean initiated.per Dr. Morton Peters.

## 2012-06-13 NOTE — Progress Notes (Signed)
Subjective:  Awake and alert, extubated, on 1:3 IABP  Objective:  Vital Signs in the last 24 hours: Temp:  [98.6 F (37 C)-100.8 F (38.2 C)] 98.6 F (37 C) (09/06 1000) Pulse Rate:  [46-137] 104  (09/06 1000) Resp:  [12-23] 22  (09/06 1000) BP: (73-121)/(36-73) 109/58 mmHg (09/06 1000) SpO2:  [96 %-100 %] 100 % (09/06 1000) Arterial Line BP: (82-129)/(42-63) 114/63 mmHg (09/06 1000) FiO2 (%):  [40 %] 40 % (09/06 0636) Weight:  [73.9 kg (162 lb 14.7 oz)] 73.9 kg (162 lb 14.7 oz) (09/06 0600)  Intake/Output from previous day:  Intake/Output Summary (Last 24 hours) at 06/13/12 1023 Last data filed at 06/13/12 1000  Gross per 24 hour  Intake 2776.6 ml  Output   4670 ml  Net -1893.4 ml    Physical Exam: General appearance: alert, cooperative and no distress Lungs: dereased breath sounds Heart: regular rate and rhythm and friction rub heard    Rate: 104  Rhythm: normal sinus rhythm and PVCs  Lab Results:  Basename 06/13/12 0335 06/12/12 1650 06/12/12 1645  WBC 10.1 -- 12.4*  HGB 9.3* 9.2* --  PLT 121* -- 134*    Basename 06/13/12 0335 06/12/12 1650 06/12/12 0500  NA 137 139 --  K 3.8 3.7 --  CL 100 100 --  CO2 27 -- 27  GLUCOSE 167* 193* --  BUN 20 16 --  CREATININE 2.00* 1.90* --    Basename 06/11/12 2300  TROPONINI >20.00*   Hepatic Function Panel  Basename 06/12/12 0500  PROT 5.2*  ALBUMIN 3.6  AST 151*  ALT 54*  ALKPHOS 47  BILITOT 0.7  BILIDIR --  IBILI --   No results found for this basename: CHOL in the last 72 hours  Basename 06/11/12 2031  INR 1.53*    Imaging: Imaging results have been reviewed  Cardiac Studies:  Assessment/Plan:   Principal Problem:  *Cardiac arrest - out of hospital, witnessed, CPR, defib in field Active Problems:  STEMI (ST elevation myocardial infarction)  CAD, 90% LM, Total RCA, 80-90% CFX- S/P urgent CABG X 4  Ischemic cardiomyopathy, EF 30-35% at cath  PVD, 90% Rt RAS, 80% Rt Iliac at cath  Tobacco  abuse  HLD (hyperlipidemia)  Plan- Doing well, consider echo prior to discharge.   Corine Shelter PA-C 06/13/2012, 10:23 AM    I have seen and examined the patient along with Corine Shelter, PAC.  I have reviewed the chart, notes and new data.  I agree with PA's note.  Key new complaints: some immediate memory loss, otherwise alert, oriented and coherent Key examination changes: faint pericardial rub, rare PVCs  PLAN: IABP to be DCd soon. Still on nominal amounts of vasoactive drugs, to be weaned off gradually. Increase betablockers and start ACEi slowly over next couple of days.  [name], MD, [facility] [phone] 06/13/2012, 2:04 PM

## 2012-06-14 ENCOUNTER — Inpatient Hospital Stay (HOSPITAL_COMMUNITY): Payer: BC Managed Care – PPO

## 2012-06-14 LAB — COMPREHENSIVE METABOLIC PANEL
ALT: 48 U/L (ref 0–53)
AST: 93 U/L — ABNORMAL HIGH (ref 0–37)
Albumin: 3.1 g/dL — ABNORMAL LOW (ref 3.5–5.2)
Alkaline Phosphatase: 53 U/L (ref 39–117)
BUN: 24 mg/dL — ABNORMAL HIGH (ref 6–23)
CO2: 26 mEq/L (ref 19–32)
Calcium: 8.7 mg/dL (ref 8.4–10.5)
Chloride: 99 mEq/L (ref 96–112)
Creatinine, Ser: 1.45 mg/dL — ABNORMAL HIGH (ref 0.50–1.35)
GFR calc Af Amer: 60 mL/min — ABNORMAL LOW (ref >90)
GFR calc non Af Amer: 52 mL/min — ABNORMAL LOW (ref >90)
Glucose, Bld: 103 mg/dL — ABNORMAL HIGH (ref 70–99)
Potassium: 3.8 mEq/L (ref 3.5–5.1)
Sodium: 134 mEq/L — ABNORMAL LOW (ref 135–145)
Total Bilirubin: 0.6 mg/dL (ref 0.3–1.2)
Total Protein: 5.6 g/dL — ABNORMAL LOW (ref 6.0–8.3)

## 2012-06-14 LAB — GLUCOSE, CAPILLARY: Glucose-Capillary: 120 mg/dL — ABNORMAL HIGH (ref 70–99)

## 2012-06-14 LAB — CULTURE, RESPIRATORY W GRAM STAIN

## 2012-06-14 LAB — CBC
HCT: 24 % — ABNORMAL LOW (ref 39.0–52.0)
Hemoglobin: 8.4 g/dL — ABNORMAL LOW (ref 13.0–17.0)
MCH: 30.2 pg (ref 26.0–34.0)
MCHC: 35 g/dL (ref 30.0–36.0)
MCV: 86.3 fL (ref 78.0–100.0)
Platelets: 95 10*3/uL — ABNORMAL LOW (ref 150–400)
RBC: 2.78 MIL/uL — ABNORMAL LOW (ref 4.22–5.81)
RDW: 12.5 % (ref 11.5–15.5)
WBC: 9.1 10*3/uL (ref 4.0–10.5)

## 2012-06-14 LAB — HEMOGLOBIN A1C
Hgb A1c MFr Bld: 5.3 % (ref <5.7)
Mean Plasma Glucose: 105 mg/dL (ref <117)

## 2012-06-14 MED ORDER — ATORVASTATIN CALCIUM 20 MG PO TABS
20.0000 mg | ORAL_TABLET | Freq: Every day | ORAL | Status: DC
  Administered 2012-06-14 – 2012-06-18 (×5): 20 mg via ORAL
  Filled 2012-06-14 (×6): qty 1

## 2012-06-14 MED ORDER — INSULIN ASPART 100 UNIT/ML ~~LOC~~ SOLN: 0.0000 [IU] | SUBCUTANEOUS | Status: DC

## 2012-06-14 MED ORDER — METOPROLOL TARTRATE 25 MG/10 ML ORAL SUSPENSION
12.5000 mg | Freq: Two times a day (BID) | ORAL | Status: DC
  Filled 2012-06-14: qty 5

## 2012-06-14 MED ORDER — INSULIN ASPART 100 UNIT/ML ~~LOC~~ SOLN
0.0000 [IU] | Freq: Three times a day (TID) | SUBCUTANEOUS | Status: DC
  Administered 2012-06-16: 4 [IU] via SUBCUTANEOUS
  Administered 2012-06-17 – 2012-06-18 (×2): 2 [IU] via SUBCUTANEOUS

## 2012-06-14 MED ORDER — METOPROLOL TARTRATE 25 MG PO TABS
25.0000 mg | ORAL_TABLET | Freq: Two times a day (BID) | ORAL | Status: DC
  Filled 2012-06-14: qty 1

## 2012-06-14 MED ORDER — TRAMADOL HCL 50 MG PO TABS: 50.0000 mg | ORAL_TABLET | Freq: Four times a day (QID) | ORAL | Status: DC | PRN

## 2012-06-14 MED ORDER — CARVEDILOL 6.25 MG PO TABS
6.2500 mg | ORAL_TABLET | Freq: Two times a day (BID) | ORAL | Status: DC
  Administered 2012-06-14 – 2012-06-16 (×4): 6.25 mg via ORAL
  Filled 2012-06-14 (×6): qty 1

## 2012-06-14 MED ORDER — GUAIFENESIN ER 600 MG PO TB12
600.0000 mg | ORAL_TABLET | Freq: Two times a day (BID) | ORAL | Status: DC
  Administered 2012-06-14 – 2012-06-18 (×9): 600 mg via ORAL
  Filled 2012-06-14 (×10): qty 1

## 2012-06-14 NOTE — Progress Notes (Signed)
Subjective:  POD # 3 CABG X 4  Objective:  Temp:  [97.8 F (36.6 C)-99.1 F (37.3 C)] 98.1 F (36.7 C) (09/07 0734) Pulse Rate:  [40-120] 54  (09/07 0900) Resp:  [14-25] 15  (09/07 0900) BP: (90-154)/(38-82) 118/58 mmHg (09/07 0900) SpO2:  [98 %-100 %] 100 % (09/07 0911) Arterial Line BP: (120-211)/(51-80) 149/64 mmHg (09/07 0700) Weight:  [73.6 kg (162 lb 4.1 oz)] 73.6 kg (162 lb 4.1 oz) (09/07 0600) Weight change: -0.3 kg (-10.6 oz)  Intake/Output from previous day: 09/06 0701 - 09/07 0700 In: 1264.9 [I.V.:1260.9; IV Piggyback:4] Out: 3375 [Urine:2755; Chest Tube:620]  Intake/Output from this shift: Total I/O In: 50.5 [I.V.:50.5] Out: 80 [Urine:60; Chest Tube:20]  Physical Exam: General appearance: alert, cooperative, appears stated age and no distress Neck: no adenopathy, no carotid bruit, no JVD, supple, symmetrical, trachea midline and thyroid not enlarged, symmetric, no tenderness/mass/nodules Lungs: clear to auscultation bilaterally Heart: regular rate and rhythm, S1, S2 normal, no murmur, click, rub or gallop Extremities: extremities normal, atraumatic, no cyanosis or edema and feet cool to palpation  Lab Results: Results for orders placed during the hospital encounter of 06/11/12 (from the past 48 hour(s))  GLUCOSE, CAPILLARY     Status: Abnormal   Collection Time   06/12/12 11:06 AM      Component Value Range Comment   Glucose-Capillary 115 (*) 70 - 99 mg/dL   GLUCOSE, CAPILLARY     Status: Abnormal   Collection Time   06/12/12 12:09 PM      Component Value Range Comment   Glucose-Capillary 113 (*) 70 - 99 mg/dL   GLUCOSE, CAPILLARY     Status: Abnormal   Collection Time   06/12/12  3:48 PM      Component Value Range Comment   Glucose-Capillary 116 (*) 70 - 99 mg/dL    Comment 1 Notify RN     MAGNESIUM     Status: Normal   Collection Time   06/12/12  4:45 PM      Component Value Range Comment   Magnesium 1.9  1.5 - 2.5 mg/dL   CBC     Status: Abnormal   Collection Time   06/12/12  4:45 PM      Component Value Range Comment   WBC 12.4 (*) 4.0 - 10.5 K/uL    RBC 3.20 (*) 4.22 - 5.81 MIL/uL    Hemoglobin 9.7 (*) 13.0 - 17.0 g/dL    HCT 16.1 (*) 09.6 - 52.0 %    MCV 86.3  78.0 - 100.0 fL    MCH 30.3  26.0 - 34.0 pg    MCHC 35.1  30.0 - 36.0 g/dL    RDW 04.5  40.9 - 81.1 %    Platelets 134 (*) 150 - 400 K/uL   CREATININE, SERUM     Status: Abnormal   Collection Time   06/12/12  4:45 PM      Component Value Range Comment   Creatinine, Ser 1.82 (*) 0.50 - 1.35 mg/dL    GFR calc non Af Amer 40 (*) >90 mL/min    GFR calc Af Amer 46 (*) >90 mL/min   POCT I-STAT 3, BLOOD GAS (G3+)     Status: Abnormal   Collection Time   06/12/12  4:46 PM      Component Value Range Comment   pH, Arterial 7.411  7.350 - 7.450    pCO2 arterial 42.2  35.0 - 45.0 mmHg    pO2, Arterial 100.0  80.0 -  100.0 mmHg    Bicarbonate 26.6 (*) 20.0 - 24.0 mEq/L    TCO2 28  0 - 100 mmol/L    O2 Saturation 97.0      Acid-Base Excess 2.0  0.0 - 2.0 mmol/L    Patient temperature 37.9 C      Collection site ARTERIAL LINE      Drawn by Nurse      Sample type ARTERIAL     POCT I-STAT, CHEM 8     Status: Abnormal   Collection Time   06/12/12  4:50 PM      Component Value Range Comment   Sodium 139  135 - 145 mEq/L    Potassium 3.7  3.5 - 5.1 mEq/L    Chloride 100  96 - 112 mEq/L    BUN 16  6 - 23 mg/dL    Creatinine, Ser 1.47 (*) 0.50 - 1.35 mg/dL    Glucose, Bld 829 (*) 70 - 99 mg/dL    Calcium, Ion 5.62  1.30 - 1.23 mmol/L    TCO2 24  0 - 100 mmol/L    Hemoglobin 9.2 (*) 13.0 - 17.0 g/dL    HCT 86.5 (*) 78.4 - 52.0 %   GLUCOSE, CAPILLARY     Status: Abnormal   Collection Time   06/12/12  7:38 PM      Component Value Range Comment   Glucose-Capillary 130 (*) 70 - 99 mg/dL   GLUCOSE, CAPILLARY     Status: Abnormal   Collection Time   06/12/12 11:27 PM      Component Value Range Comment   Glucose-Capillary 121 (*) 70 - 99 mg/dL   BASIC METABOLIC PANEL     Status:  Abnormal   Collection Time   06/13/12  3:35 AM      Component Value Range Comment   Sodium 137  135 - 145 mEq/L    Potassium 3.8  3.5 - 5.1 mEq/L    Chloride 100  96 - 112 mEq/L    CO2 27  19 - 32 mEq/L    Glucose, Bld 167 (*) 70 - 99 mg/dL    BUN 20  6 - 23 mg/dL    Creatinine, Ser 6.96 (*) 0.50 - 1.35 mg/dL    Calcium 8.7  8.4 - 29.5 mg/dL    GFR calc non Af Amer 35 (*) >90 mL/min    GFR calc Af Amer 41 (*) >90 mL/min   CBC     Status: Abnormal   Collection Time   06/13/12  3:35 AM      Component Value Range Comment   WBC 10.1  4.0 - 10.5 K/uL    RBC 3.06 (*) 4.22 - 5.81 MIL/uL    Hemoglobin 9.3 (*) 13.0 - 17.0 g/dL    HCT 28.4 (*) 13.2 - 52.0 %    MCV 86.6  78.0 - 100.0 fL    MCH 30.4  26.0 - 34.0 pg    MCHC 35.1  30.0 - 36.0 g/dL    RDW 44.0  10.2 - 72.5 %    Platelets 121 (*) 150 - 400 K/uL   POCT I-STAT 3, BLOOD GAS (G3+)     Status: Abnormal   Collection Time   06/13/12  3:39 AM      Component Value Range Comment   pH, Arterial 7.405  7.350 - 7.450    pCO2 arterial 42.3  35.0 - 45.0 mmHg    pO2, Arterial 108.0 (*) 80.0 - 100.0 mmHg  Bicarbonate 26.3 (*) 20.0 - 24.0 mEq/L    TCO2 28  0 - 100 mmol/L    O2 Saturation 98.0      Acid-Base Excess 2.0  0.0 - 2.0 mmol/L    Patient temperature 37.7 C      Sample type ARTERIAL     GLUCOSE, CAPILLARY     Status: Abnormal   Collection Time   06/13/12  4:27 AM      Component Value Range Comment   Glucose-Capillary 173 (*) 70 - 99 mg/dL   POCT I-STAT 3, BLOOD GAS (G3+)     Status: Abnormal   Collection Time   06/13/12  6:51 AM      Component Value Range Comment   pH, Arterial 7.405  7.350 - 7.450    pCO2 arterial 40.4  35.0 - 45.0 mmHg    pO2, Arterial 104.0 (*) 80.0 - 100.0 mmHg    Bicarbonate 25.2 (*) 20.0 - 24.0 mEq/L    TCO2 26  0 - 100 mmol/L    O2 Saturation 98.0      Acid-Base Excess 1.0  0.0 - 2.0 mmol/L    Patient temperature 37.5 C      Sample type ARTERIAL     GLUCOSE, CAPILLARY     Status: Abnormal   Collection  Time   06/13/12  7:41 AM      Component Value Range Comment   Glucose-Capillary 118 (*) 70 - 99 mg/dL    Comment 1 Documented in Chart      Comment 2 Notify RN     POCT I-STAT 3, BLOOD GAS (G3+)     Status: Abnormal   Collection Time   06/13/12  9:13 AM      Component Value Range Comment   pH, Arterial 7.430  7.350 - 7.450    pCO2 arterial 40.8  35.0 - 45.0 mmHg    pO2, Arterial 119.0 (*) 80.0 - 100.0 mmHg    Bicarbonate 27.1 (*) 20.0 - 24.0 mEq/L    TCO2 28  0 - 100 mmol/L    O2 Saturation 99.0      Acid-Base Excess 3.0 (*) 0.0 - 2.0 mmol/L    Patient temperature 37.1 C      Collection site ARTERIAL LINE      Drawn by Operator      Sample type ARTERIAL     GLUCOSE, CAPILLARY     Status: Abnormal   Collection Time   06/13/12 12:03 PM      Component Value Range Comment   Glucose-Capillary 118 (*) 70 - 99 mg/dL    Comment 1 Documented in Chart      Comment 2 Notify RN     GLUCOSE, CAPILLARY     Status: Abnormal   Collection Time   06/13/12  4:15 PM      Component Value Range Comment   Glucose-Capillary 112 (*) 70 - 99 mg/dL    Comment 1 Notify RN      Comment 2 Documented in Chart     GLUCOSE, CAPILLARY     Status: Abnormal   Collection Time   06/13/12  7:45 PM      Component Value Range Comment   Glucose-Capillary 127 (*) 70 - 99 mg/dL    Comment 1 Notify RN     GLUCOSE, CAPILLARY     Status: Abnormal   Collection Time   06/14/12 12:21 AM      Component Value Range Comment   Glucose-Capillary 109 (*) 70 -  99 mg/dL   GLUCOSE, CAPILLARY     Status: Abnormal   Collection Time   06/14/12  4:00 AM      Component Value Range Comment   Glucose-Capillary 102 (*) 70 - 99 mg/dL   COMPREHENSIVE METABOLIC PANEL     Status: Abnormal   Collection Time   06/14/12  4:27 AM      Component Value Range Comment   Sodium 134 (*) 135 - 145 mEq/L    Potassium 3.8  3.5 - 5.1 mEq/L    Chloride 99  96 - 112 mEq/L    CO2 26  19 - 32 mEq/L    Glucose, Bld 103 (*) 70 - 99 mg/dL    BUN 24 (*) 6 - 23  mg/dL    Creatinine, Ser 1.61 (*) 0.50 - 1.35 mg/dL    Calcium 8.7  8.4 - 09.6 mg/dL    Total Protein 5.6 (*) 6.0 - 8.3 g/dL    Albumin 3.1 (*) 3.5 - 5.2 g/dL    AST 93 (*) 0 - 37 U/L    ALT 48  0 - 53 U/L    Alkaline Phosphatase 53  39 - 117 U/L    Total Bilirubin 0.6  0.3 - 1.2 mg/dL    GFR calc non Af Amer 52 (*) >90 mL/min    GFR calc Af Amer 60 (*) >90 mL/min   CBC     Status: Abnormal   Collection Time   06/14/12  4:27 AM      Component Value Range Comment   WBC 9.1  4.0 - 10.5 K/uL    RBC 2.78 (*) 4.22 - 5.81 MIL/uL    Hemoglobin 8.4 (*) 13.0 - 17.0 g/dL    HCT 04.5 (*) 40.9 - 52.0 %    MCV 86.3  78.0 - 100.0 fL    MCH 30.2  26.0 - 34.0 pg    MCHC 35.0  30.0 - 36.0 g/dL    RDW 81.1  91.4 - 78.2 %    Platelets 95 (*) 150 - 400 K/uL PLATELET COUNT CONFIRMED BY SMEAR  GLUCOSE, CAPILLARY     Status: Normal   Collection Time   06/14/12  7:33 AM      Component Value Range Comment   Glucose-Capillary 97  70 - 99 mg/dL     Imaging: Imaging results have been reviewed  Assessment/Plan:   1. Principal Problem: 2.  *Cardiac arrest - out of hospital, witnessed, CPR, defib in field 3. Active Problems: 4.  STEMI (ST elevation myocardial infarction) 5.  Tobacco abuse 6.  HLD (hyperlipidemia) 7.  Ischemic cardiomyopathy, EF 30-35% at cath 8.  PVD, 90% Rt RAS, 80% Rt Iliac at cath 9.  CAD, 90% LM, Total RCA, 80-90% CFX- S/P urgent CABG X 4 10.   Time Spent Directly with Patient:  20 minutes  Length of Stay:  LOS: 3 days   POD # 3 emergency CABG X 4 after witnessed SCD/bystander CPR/Defib. Cath showing LM/3VD with moderately severe LVD. VSS. Neurologically intact. No memory of event or that day. Exam benign. Labs OK. NSR with PVCs/BBB. On low dose BB. Can increase and start low dose ACE-I and statin. Nl progression. Treatment per TCTS.  Runell Gess 06/14/2012, 10:29 AM

## 2012-06-14 NOTE — Progress Notes (Signed)
P.m. Rounds  Acute MI, emergency CABG with preop balloon Sinus rhythm stable hemodynamics off Crestor is Ambulated around the ICU Adequate diuresis  Wt Readings from Last 3 Encounters:  06/14/12 162 lb 4.1 oz (73.6 kg)  06/14/12 162 lb 4.1 oz (73.6 kg)  06/14/12 162 lb 4.1 oz (73.6 kg)   Temp Readings from Last 3 Encounters:  06/14/12 98 F (36.7 C) Oral  06/14/12 98 F (36.7 C) Oral  06/14/12 98 F (36.7 C) Oral   BP Readings from Last 3 Encounters:  06/14/12 105/69  06/14/12 105/69  06/14/12 105/69   Pulse Readings from Last 3 Encounters:  06/14/12 84  06/14/12 84  06/14/12 84   Lab Results  Component Value Date   CREATININE 1.45* 06/14/2012   BUN 24* 06/14/2012   NA 134* 06/14/2012   K 3.8 06/14/2012   CL 99 06/14/2012   CO2 26 06/14/2012      Stable day, followup creatinine tomorrow

## 2012-06-14 NOTE — Progress Notes (Signed)
T.. CTS progress note  Postop day 3 emergency CABG x4 for V. fib arrest, preop balloon pump and intubation  Hemodynamic stable off inotropes and removal of balloon pump. Chest x-ray clear neurologic intact Blood pressure remains elevated with creatinine increased to  1.8 so we'll hold ACE inhibitor for now.  Patient to mobilize out of bed continue diuresis. Follow low hematocrit 24.2.  Filed Vitals:   06/14/12 1126  BP:   Pulse:   Temp: 98.6 F (37 C)  Resp:     Lab Results  Component Value Date   CREATININE 1.45* 06/14/2012   BUN 24* 06/14/2012   NA 134* 06/14/2012   K 3.8 06/14/2012   CL 99 06/14/2012   CO2 26 06/14/2012    Lab Results  Component Value Date   WBC 9.1 06/14/2012   HGB 8.4* 06/14/2012   HCT 24.0* 06/14/2012   MCV 86.3 06/14/2012   PLT 95* 06/14/2012

## 2012-06-15 ENCOUNTER — Inpatient Hospital Stay (HOSPITAL_COMMUNITY): Payer: BC Managed Care – PPO

## 2012-06-15 LAB — CBC
HCT: 25 % — ABNORMAL LOW (ref 39.0–52.0)
Hemoglobin: 8.7 g/dL — ABNORMAL LOW (ref 13.0–17.0)
MCH: 30.2 pg (ref 26.0–34.0)
MCHC: 34.8 g/dL (ref 30.0–36.0)
MCV: 86.8 fL (ref 78.0–100.0)
Platelets: 138 10*3/uL — ABNORMAL LOW (ref 150–400)
RBC: 2.88 MIL/uL — ABNORMAL LOW (ref 4.22–5.81)
RDW: 12.6 % (ref 11.5–15.5)
WBC: 9.2 10*3/uL (ref 4.0–10.5)

## 2012-06-15 LAB — TYPE AND SCREEN: Unit division: 0

## 2012-06-15 LAB — BASIC METABOLIC PANEL
BUN: 22 mg/dL (ref 6–23)
CO2: 28 mEq/L (ref 19–32)
Calcium: 9.1 mg/dL (ref 8.4–10.5)
Chloride: 98 mEq/L (ref 96–112)
Creatinine, Ser: 1.15 mg/dL (ref 0.50–1.35)
GFR calc Af Amer: 80 mL/min — ABNORMAL LOW (ref >90)
GFR calc non Af Amer: 69 mL/min — ABNORMAL LOW (ref >90)
Glucose, Bld: 89 mg/dL (ref 70–99)
Potassium: 3.7 mEq/L (ref 3.5–5.1)
Sodium: 134 mEq/L — ABNORMAL LOW (ref 135–145)

## 2012-06-15 LAB — GLUCOSE, CAPILLARY: Glucose-Capillary: 79 mg/dL (ref 70–99)

## 2012-06-15 MED ORDER — POTASSIUM CHLORIDE 10 MEQ/50ML IV SOLN
10.0000 meq | INTRAVENOUS | Status: AC | PRN
  Administered 2012-06-15 (×3): 10 meq via INTRAVENOUS
  Filled 2012-06-15: qty 100
  Filled 2012-06-15: qty 50

## 2012-06-15 MED ORDER — LISINOPRIL 5 MG PO TABS
5.0000 mg | ORAL_TABLET | Freq: Every day | ORAL | Status: DC
  Filled 2012-06-15 (×2): qty 1

## 2012-06-15 MED ORDER — MIDAZOLAM HCL 2 MG/2ML IJ SOLN
INTRAMUSCULAR | Status: AC
  Filled 2012-06-15: qty 2

## 2012-06-15 NOTE — Progress Notes (Signed)
Subjective:  POD # 4 emergency CABG x 4. No CP/SOB  Objective:  Temp:  [98 F (36.7 C)-98.6 F (37 C)] 98.1 F (36.7 C) (09/08 0730) Pulse Rate:  [54-93] 93  (09/08 0700) Resp:  [12-21] 21  (09/08 0700) BP: (105-136)/(43-76) 136/59 mmHg (09/08 0700) SpO2:  [98 %-100 %] 100 % (09/08 0700) Weight:  [73 kg (160 lb 15 oz)] 73 kg (160 lb 15 oz) (09/08 0500) Weight change: -0.6 kg (-1 lb 5.2 oz)  Intake/Output from previous day: 09/07 0701 - 09/08 0700 In: 502.5 [I.V.:502.5] Out: 2325 [Urine:2135; Chest Tube:190]  Intake/Output from this shift:    Physical Exam: General appearance: alert, cooperative, appears stated age and no distress Neck: no adenopathy, no carotid bruit, no JVD, supple, symmetrical, trachea midline and thyroid not enlarged, symmetric, no tenderness/mass/nodules Lungs: clear to auscultation bilaterally Heart: regular rate and rhythm, S1, S2 normal, no murmur, click, rub or gallop Extremities: extremities normal, atraumatic, no cyanosis or edema  Lab Results: Results for orders placed during the hospital encounter of 06/11/12 (from the past 48 hour(s))  POCT I-STAT 3, BLOOD GAS (G3+)     Status: Abnormal   Collection Time   06/13/12  9:13 AM      Component Value Range Comment   pH, Arterial 7.430  7.350 - 7.450    pCO2 arterial 40.8  35.0 - 45.0 mmHg    pO2, Arterial 119.0 (*) 80.0 - 100.0 mmHg    Bicarbonate 27.1 (*) 20.0 - 24.0 mEq/L    TCO2 28  0 - 100 mmol/L    O2 Saturation 99.0      Acid-Base Excess 3.0 (*) 0.0 - 2.0 mmol/L    Patient temperature 37.1 C      Collection site ARTERIAL LINE      Drawn by Operator      Sample type ARTERIAL     GLUCOSE, CAPILLARY     Status: Abnormal   Collection Time   06/13/12 12:03 PM      Component Value Range Comment   Glucose-Capillary 118 (*) 70 - 99 mg/dL    Comment 1 Documented in Chart      Comment 2 Notify RN     GLUCOSE, CAPILLARY     Status: Abnormal   Collection Time   06/13/12  4:15 PM      Component  Value Range Comment   Glucose-Capillary 112 (*) 70 - 99 mg/dL    Comment 1 Notify RN      Comment 2 Documented in Chart     GLUCOSE, CAPILLARY     Status: Abnormal   Collection Time   06/13/12  7:45 PM      Component Value Range Comment   Glucose-Capillary 127 (*) 70 - 99 mg/dL    Comment 1 Notify RN     GLUCOSE, CAPILLARY     Status: Abnormal   Collection Time   06/14/12 12:21 AM      Component Value Range Comment   Glucose-Capillary 109 (*) 70 - 99 mg/dL   GLUCOSE, CAPILLARY     Status: Abnormal   Collection Time   06/14/12  4:00 AM      Component Value Range Comment   Glucose-Capillary 102 (*) 70 - 99 mg/dL   COMPREHENSIVE METABOLIC PANEL     Status: Abnormal   Collection Time   06/14/12  4:27 AM      Component Value Range Comment   Sodium 134 (*) 135 - 145 mEq/L    Potassium  3.8  3.5 - 5.1 mEq/L    Chloride 99  96 - 112 mEq/L    CO2 26  19 - 32 mEq/L    Glucose, Bld 103 (*) 70 - 99 mg/dL    BUN 24 (*) 6 - 23 mg/dL    Creatinine, Ser 7.82 (*) 0.50 - 1.35 mg/dL    Calcium 8.7  8.4 - 95.6 mg/dL    Total Protein 5.6 (*) 6.0 - 8.3 g/dL    Albumin 3.1 (*) 3.5 - 5.2 g/dL    AST 93 (*) 0 - 37 U/L    ALT 48  0 - 53 U/L    Alkaline Phosphatase 53  39 - 117 U/L    Total Bilirubin 0.6  0.3 - 1.2 mg/dL    GFR calc non Af Amer 52 (*) >90 mL/min    GFR calc Af Amer 60 (*) >90 mL/min   HEMOGLOBIN A1C     Status: Normal   Collection Time   06/14/12  4:27 AM      Component Value Range Comment   Hemoglobin A1C 5.3  <5.7 %    Mean Plasma Glucose 105  <117 mg/dL   CBC     Status: Abnormal   Collection Time   06/14/12  4:27 AM      Component Value Range Comment   WBC 9.1  4.0 - 10.5 K/uL    RBC 2.78 (*) 4.22 - 5.81 MIL/uL    Hemoglobin 8.4 (*) 13.0 - 17.0 g/dL    HCT 21.3 (*) 08.6 - 52.0 %    MCV 86.3  78.0 - 100.0 fL    MCH 30.2  26.0 - 34.0 pg    MCHC 35.0  30.0 - 36.0 g/dL    RDW 57.8  46.9 - 62.9 %    Platelets 95 (*) 150 - 400 K/uL PLATELET COUNT CONFIRMED BY SMEAR  GLUCOSE,  CAPILLARY     Status: Normal   Collection Time   06/14/12  7:33 AM      Component Value Range Comment   Glucose-Capillary 97  70 - 99 mg/dL   GLUCOSE, CAPILLARY     Status: Abnormal   Collection Time   06/14/12 11:25 AM      Component Value Range Comment   Glucose-Capillary 120 (*) 70 - 99 mg/dL    Comment 1 Notify RN     GLUCOSE, CAPILLARY     Status: Normal   Collection Time   06/14/12  4:07 PM      Component Value Range Comment   Glucose-Capillary 86  70 - 99 mg/dL   GLUCOSE, CAPILLARY     Status: Normal   Collection Time   06/14/12  9:30 PM      Component Value Range Comment   Glucose-Capillary 88  70 - 99 mg/dL   BASIC METABOLIC PANEL     Status: Abnormal   Collection Time   06/15/12  4:11 AM      Component Value Range Comment   Sodium 134 (*) 135 - 145 mEq/L    Potassium 3.7  3.5 - 5.1 mEq/L    Chloride 98  96 - 112 mEq/L    CO2 28  19 - 32 mEq/L    Glucose, Bld 89  70 - 99 mg/dL    BUN 22  6 - 23 mg/dL    Creatinine, Ser 5.28  0.50 - 1.35 mg/dL    Calcium 9.1  8.4 - 41.3 mg/dL    GFR calc non  Af Amer 69 (*) >90 mL/min    GFR calc Af Amer 80 (*) >90 mL/min   CBC     Status: Abnormal   Collection Time   06/15/12  4:11 AM      Component Value Range Comment   WBC 9.2  4.0 - 10.5 K/uL    RBC 2.88 (*) 4.22 - 5.81 MIL/uL    Hemoglobin 8.7 (*) 13.0 - 17.0 g/dL    HCT 16.1 (*) 09.6 - 52.0 %    MCV 86.8  78.0 - 100.0 fL    MCH 30.2  26.0 - 34.0 pg    MCHC 34.8  30.0 - 36.0 g/dL    RDW 04.5  40.9 - 81.1 %    Platelets 138 (*) 150 - 400 K/uL   GLUCOSE, CAPILLARY     Status: Normal   Collection Time   06/15/12  7:28 AM      Component Value Range Comment   Glucose-Capillary 79  70 - 99 mg/dL     Imaging: Imaging results have been reviewed  Assessment/Plan:   1. Principal Problem: 2.  *Cardiac arrest - out of hospital, witnessed, CPR, defib in field 3. Active Problems: 4.  STEMI (ST elevation myocardial infarction) 5.  Tobacco abuse 6.  HLD (hyperlipidemia) 7.  Ischemic  cardiomyopathy, EF 30-35% at cath 8.  PVD, 90% Rt RAS, 80% Rt Iliac at cath 9.  CAD, 90% LM, Total RCA, 80-90% CFX- S/P urgent CABG X 4 10.   Time Spent Directly with Patient:  20 minutes  Length of Stay:  LOS: 4 days   Looks great. Exam benign. Labs OK. HR still mildly increased. Can titrate up BB. Scr has come down nicely. Prob start ACE-I once transfers to tele. [facility]. Will get 2D prior to D/C to eval LV fxn and possible need for LifeVest.  Whitt Auletta J 06/15/2012, 8:36 AM

## 2012-06-15 NOTE — Progress Notes (Addendum)
[address]            ,[place] [address]          [phone]     4 Days Post-Op Procedure(s) (LRB): CORONARY ARTERY BYPASS GRAFTING (CABG) ([name])  Subjective: Feels well.  Breathing stable.  Walked several times in halls.  Objective: Vital signs in last 24 hours: Patient Vitals for the past 24 hrs:  BP Temp Temp src Pulse Resp SpO2 Weight  06/15/12 1200 131/67 mmHg - - 92  17  100 % -  06/15/12 1138 - 98.7 F (37.1 C) Oral - - - -  06/15/12 1100 142/68 mmHg - - 93  22  100 % -  06/15/12 1000 137/77 mmHg - - - 18  - -  06/15/12 0900 148/40 mmHg - - - 19  - -  06/15/12 0800 130/55 mmHg - - - 17  100 % -  06/15/12 0730 - 98.1 F (36.7 C) Oral - - - -  06/15/12 0700 136/59 mmHg - - 93  21  100 % -  06/15/12 0600 120/46 mmHg - - 84  17  100 % -  06/15/12 0500 119/50 mmHg - - 86  18  100 % 160 lb 15 oz (73 kg)  06/15/12 0400 113/43 mmHg 98.2 F (36.8 C) Oral 86  18  100 % -  06/15/12 0300 126/58 mmHg - - 82  16  100 % -  06/15/12 0232 - - - 81  16  100 % -  06/15/12 0200 117/52 mmHg - - 81  16  100 % -  06/15/12 0100 111/63 mmHg - - 82  16  100 % -  06/15/12 0000 108/65 mmHg 98.3 F (36.8 C) Oral 82  18  100 % -  06/14/12 2300 120/57 mmHg - - 84  17  100 % -  06/14/12 2200 117/59 mmHg - - 87  14  100 % -  06/14/12 2100 132/63 mmHg - - 88  19  100 % -  06/14/12 2027 - - - 86  14  100 % -  06/14/12 2000 112/55 mmHg 98.2 F (36.8 C) Oral 86  13  100 % -  06/14/12 1800 119/64 mmHg - - 84  20  98 % -  06/14/12 1700 114/67 mmHg - - 83  21  99 % -  06/14/12 1609 - 98 F (36.7 C) Oral - - - -  06/14/12 1600 110/68 mmHg - - 80  13  98 % -  06/14/12 1500 107/62 mmHg - - 79  12  98 % -  06/14/12 1459 - - - - - 99 % -  06/14/12 1400 112/69 mmHg - - 85  17  99 % -   Current Weight  06/15/12 160 lb 15 oz (73 kg)   PRE-OPERATIVE WEIGHT: 75kg   Intake/Output from previous day: 09/07 0701 - 09/08 0700 In: 502.5 [I.V.:502.5] Out: 2325  [Urine:2135; Chest Tube:190]  CBGs 91-47-82-95  PHYSICAL EXAM:  Heart: RRR Lungs: Clear Wound: Clean and dry Extremities: Mild LE edema, R>L    Lab Results: CBC: Basename 06/15/12 0411 06/14/12 0427  WBC 9.2 9.1  HGB 8.7* 8.4*  HCT 25.0* 24.0*  PLT 138* 95*   BMET:  Basename 06/15/12 0411 06/14/12 0427  NA 134* 134*  K 3.7 3.8  CL 98 99  CO2 28 26  GLUCOSE 89 103*  BUN 22 24*  CREATININE 1.15 1.45*  CALCIUM 9.1 8.7    PT/INR: No results found for this basename: LABPROT,INR in the last 72 hours  CXR: IMPRESSION:  1. Slightly increased right pleural effusion with new apical  capping.  2. Increasing bibasilar atelectasis.  3. Improved interstitial edema  4. Stable fluid and / or atelectasis in the minor fissure and left  major fissure.    Assessment/Plan: S/P Procedure(s) (LRB): CORONARY ARTERY BYPASS GRAFTING (CABG) ([name])  CV- SR, BPs trending up. Continue Coreg, Dig.  Will add low dose ACE-I and watch renal function closely.  CBGs stable and no prior h/o DM.  Will d/c Lantus, continue SSI prn.  Vol overload- diurese.  CT output has decreased but today's CXR shows a small apical ptx on the right that was not on yesterday's film. Will leave CT for now until seen by MD. Hopefully can d/c soon.  Hypokalemia- K+ replaced.  Expected postop blood loss anemia- stable.  Monitor.  Possibly ready for tx stepdown later today if CT can be d/c'ed.   LOS: 4 days    COLLINS,GINA H 06/15/2012   patient examined and medical record reviewed,agree with above note. VAN TRIGT III,PETER 06/15/2012

## 2012-06-16 ENCOUNTER — Inpatient Hospital Stay (HOSPITAL_COMMUNITY): Payer: BC Managed Care – PPO

## 2012-06-16 LAB — BASIC METABOLIC PANEL
BUN: 21 mg/dL (ref 6–23)
CO2: 30 mEq/L (ref 19–32)
Calcium: 9.2 mg/dL (ref 8.4–10.5)
Chloride: 97 mEq/L (ref 96–112)
Creatinine, Ser: 1.08 mg/dL (ref 0.50–1.35)
GFR calc Af Amer: 86 mL/min — ABNORMAL LOW (ref >90)
GFR calc non Af Amer: 74 mL/min — ABNORMAL LOW (ref >90)
Glucose, Bld: 91 mg/dL (ref 70–99)
Potassium: 3.2 mEq/L — ABNORMAL LOW (ref 3.5–5.1)
Sodium: 136 mEq/L (ref 135–145)

## 2012-06-16 LAB — CBC
HCT: 26.6 % — ABNORMAL LOW (ref 39.0–52.0)
Hemoglobin: 9.3 g/dL — ABNORMAL LOW (ref 13.0–17.0)
MCH: 30.4 pg (ref 26.0–34.0)
MCV: 86.9 fL (ref 78.0–100.0)
Platelets: 205 10*3/uL (ref 150–400)
RBC: 3.06 MIL/uL — ABNORMAL LOW (ref 4.22–5.81)

## 2012-06-16 LAB — GLUCOSE, CAPILLARY: Glucose-Capillary: 95 mg/dL (ref 70–99)

## 2012-06-16 MED ORDER — FUROSEMIDE 40 MG PO TABS
40.0000 mg | ORAL_TABLET | Freq: Every day | ORAL | Status: DC
  Administered 2012-06-16 – 2012-06-18 (×3): 40 mg via ORAL
  Filled 2012-06-16 (×3): qty 1

## 2012-06-16 MED ORDER — POTASSIUM CHLORIDE 10 MEQ/50ML IV SOLN
10.0000 meq | INTRAVENOUS | Status: AC | PRN
  Administered 2012-06-16 (×3): 10 meq via INTRAVENOUS
  Filled 2012-06-16: qty 100

## 2012-06-16 MED ORDER — POTASSIUM CHLORIDE 10 MEQ/50ML IV SOLN
INTRAVENOUS | Status: AC
  Filled 2012-06-16: qty 150

## 2012-06-16 MED ORDER — SODIUM CHLORIDE 0.9 % IV SOLN: 250.0000 mL | INTRAVENOUS | Status: DC | PRN

## 2012-06-16 MED ORDER — SODIUM CHLORIDE 0.9 % IJ SOLN: 3.0000 mL | INTRAMUSCULAR | Status: DC | PRN

## 2012-06-16 MED ORDER — METOPROLOL TARTRATE 12.5 MG HALF TABLET
12.5000 mg | ORAL_TABLET | Freq: Two times a day (BID) | ORAL | Status: DC
  Administered 2012-06-16: 12.5 mg via ORAL
  Filled 2012-06-16 (×3): qty 1

## 2012-06-16 MED ORDER — LISINOPRIL 10 MG PO TABS
10.0000 mg | ORAL_TABLET | Freq: Every day | ORAL | Status: DC
  Administered 2012-06-16 – 2012-06-18 (×3): 10 mg via ORAL
  Filled 2012-06-16 (×3): qty 1

## 2012-06-16 MED ORDER — POTASSIUM CHLORIDE CRYS ER 20 MEQ PO TBCR
20.0000 meq | EXTENDED_RELEASE_TABLET | Freq: Every day | ORAL | Status: DC
  Administered 2012-06-16 – 2012-06-18 (×3): 20 meq via ORAL
  Filled 2012-06-16 (×3): qty 1

## 2012-06-16 MED ORDER — ENSURE COMPLETE PO LIQD
237.0000 mL | Freq: Two times a day (BID) | ORAL | Status: DC
  Administered 2012-06-16 – 2012-06-18 (×3): 237 mL via ORAL

## 2012-06-16 MED ORDER — MOVING RIGHT ALONG BOOK
Freq: Once | Status: AC
  Administered 2012-06-16: 14:00:00
  Filled 2012-06-16: qty 1

## 2012-06-16 MED ORDER — SODIUM CHLORIDE 0.9 % IJ SOLN
3.0000 mL | Freq: Two times a day (BID) | INTRAMUSCULAR | Status: DC
  Administered 2012-06-18: 3 mL via INTRAVENOUS

## 2012-06-16 MED ORDER — LEVALBUTEROL HCL 0.63 MG/3ML IN NEBU
0.6300 mg | INHALATION_SOLUTION | Freq: Two times a day (BID) | RESPIRATORY_TRACT | Status: DC
  Administered 2012-06-17 – 2012-06-18 (×3): 0.63 mg via RESPIRATORY_TRACT
  Filled 2012-06-16 (×5): qty 3

## 2012-06-16 NOTE — Progress Notes (Signed)
CARDIAC REHAB PHASE I   PRE:  Rate/Rhythm: 105 ST    BP: sitting 130/80    SaO2: 96 RA  MODE:  Ambulation: 450 ft   POST:  Rate/Rhythm: 108 ST    BP: sitting 134/70     SaO2: 96 RA  Tolerated well. No RW needed. Light assist. SOB while talking, HR 108 ST, SaO2 good. Discussed smoking cessation. Provided materials and diet sheet. Will f/u. [phone]  Harriet Masson CES, ACSM

## 2012-06-16 NOTE — Progress Notes (Signed)
5 Days Post-Op Procedure(s) (LRB): CORONARY ARTERY BYPASS GRAFTING (CABG) ([name]) Subjective:post emrg CABG , vfib arrest Transfer to 2000, see orders Objective: Vital signs in last 24 hours: Temp:  [98 F (36.7 C)-98.7 F (37.1 C)] 98 F (36.7 C) (09/09 0741) Pulse Rate:  [78-100] 96  (09/09 0800) Cardiac Rhythm:  [-] Normal sinus rhythm (09/09 0800) Resp:  [15-26] 23  (09/09 0800) BP: (98-148)/(40-87) 125/76 mmHg (09/09 0800) SpO2:  [94 %-100 %] 97 % (09/09 0800) Weight:  [155 lb 10.3 oz (70.6 kg)] 155 lb 10.3 oz (70.6 kg) (09/09 0600)  Hemodynamic parameters for last 24 hours:  nsr  Intake/Output from previous day: 09/08 0701 - 09/09 0700 In: 680 [P.O.:120; I.V.:360; IV Piggyback:200] Out: 1402 [Urine:1400; Stool:2] Intake/Output this shift: Total I/O In: 130 [P.O.:60; I.V.:20; IV Piggyback:50] Out: -   CXR clear Incisions clean  Lab Results:  Basename 06/16/12 0345 06/15/12 0411  WBC 10.6* 9.2  HGB 9.3* 8.7*  HCT 26.6* 25.0*  PLT 205 138*   BMET:  Basename 06/16/12 0345 06/15/12 0411  NA 136 134*  K 3.2* 3.7  CL 97 98  CO2 30 28  GLUCOSE 91 89  BUN 21 22  CREATININE 1.08 1.15  CALCIUM 9.2 9.1    PT/INR: No results found for this basename: LABPROT,INR in the last 72 hours ABG    Component Value Date/Time   PHART 7.430 06/13/2012 0913   HCO3 27.1* 06/13/2012 0913   TCO2 28 06/13/2012 0913   ACIDBASEDEF 1.0 06/11/2012 2034   O2SAT 99.0 06/13/2012 0913   CBG (last 3)   Basename 06/16/12 0739 06/15/12 2152 06/15/12 1528  GLUCAP 95 97 86    Assessment/Plan: S/P Procedure(s) (LRB): CORONARY ARTERY BYPASS GRAFTING (CABG) ([name]) Plan for transfer to step-down: see transfer orders See progression orders   LOS: 5 days    VAN TRIGT III,PETER 06/16/2012

## 2012-06-16 NOTE — Progress Notes (Signed)
Nutrition Follow-up  Intervention:   Ensure Complete twice daily between meals (350 kcals, 13 gm protein per 8 fl oz bottle) RD to follow for nutrition care plan  Assessment:   Patient extubated 9/6. Patient states his appetite is "so-so;" doesn't particularly care for the food. PO intake 20% per flowsheet records. Amenable to Ensure Complete supplement -- RD to order.  Diet Order:  Carbohydrate Modified Medium Calorie  Meds: Scheduled Meds:   . acetaminophen  1,000 mg Oral Q6H   Or  . acetaminophen (TYLENOL) oral liquid 160 mg/5 mL  975 mg Per Tube Q6H  . ALPRAZolam  1 mg Oral QHS  . aspirin EC  325 mg Oral Daily   Or  . aspirin  324 mg Per Tube Daily  . atorvastatin  20 mg Oral q1800  . budesonide  0.5 mg Nebulization BID  . digoxin  0.25 mg Oral Daily  . furosemide  40 mg Oral Daily  . guaiFENesin  600 mg Oral BID  . insulin aspart  0-24 Units Subcutaneous TID AC & HS  . levalbuterol  0.63 mg Nebulization Q6H  . lisinopril  10 mg Oral Daily  . midazolam      . nicotine  21 mg Transdermal Daily  . pantoprazole  40 mg Oral Q1200  . pneumococcal 23 valent vaccine  0.5 mL Intramuscular Once  . potassium chloride      . sodium chloride  3 mL Intravenous Q12H  . DISCONTD: bisacodyl  10 mg Oral Daily  . DISCONTD: bisacodyl  10 mg Rectal Daily  . DISCONTD: carvedilol  6.25 mg Oral BID WC  . DISCONTD: docusate sodium  200 mg Oral Daily  . DISCONTD: furosemide  20 mg Intravenous BID  . DISCONTD: insulin glargine  12 Units Subcutaneous Daily  . DISCONTD: lisinopril  5 mg Oral Daily   Continuous Infusions:   . sodium chloride 20 mL/hr at 06/13/12 2200   PRN Meds:.metoprolol, ondansetron (ZOFRAN) IV, oxyCODONE, potassium chloride, potassium chloride, sodium chloride, traMADol, DISCONTD:  morphine injection  Labs:  CMP     Component Value Date/Time   NA 136 06/16/2012 0345   K 3.2* 06/16/2012 0345   CL 97 06/16/2012 0345   CO2 30 06/16/2012 0345   GLUCOSE 91 06/16/2012 0345   BUN  21 06/16/2012 0345   CREATININE 1.08 06/16/2012 0345   CALCIUM 9.2 06/16/2012 0345   PROT 5.6* 06/14/2012 0427   ALBUMIN 3.1* 06/14/2012 0427   AST 93* 06/14/2012 0427   ALT 48 06/14/2012 0427   ALKPHOS 53 06/14/2012 0427   BILITOT 0.6 06/14/2012 0427   GFRNONAA 74* 06/16/2012 0345   GFRAA 86* 06/16/2012 0345     Intake/Output Summary (Last 24 hours) at 06/16/12 1148 Last data filed at 06/16/12 1000  Gross per 24 hour  Intake    670 ml  Output   1404 ml  Net   -734 ml    CBG (last 3)   Basename 06/16/12 0739 06/15/12 2152 06/15/12 1528  GLUCAP 95 97 86    Weight Status:  70.6 kg (9/9) -- down due to diuresis   Re-estimated needs:  2100-2300 kcals, 110-120 gm protein  Nutrition Dx:  Inadequate Oral Intake now r/t decreased appetite as evidenced by PO intake 20%, ongoing  New Goal:  Oral intake with meals & supplements to meet >/= 90% of estimated nutrition needs, unmet  Monitor:  PO & supplemental intake, weight, labs, I/O's  Kirkland Hun, RD, LDN Pager #: [phone] After-Hours Pager #:  [phone]   

## 2012-06-16 NOTE — Progress Notes (Signed)
Subjective:  POD # 5 emergent CABG X 4. No CP/SOB  Objective:  Temp:  [98 F (36.7 C)-98.7 F (37.1 C)] 98 F (36.7 C) (09/09 0741) Pulse Rate:  [78-100] 96  (09/09 0800) Resp:  [15-26] 23  (09/09 0800) BP: (98-148)/(50-87) 125/76 mmHg (09/09 0800) SpO2:  [94 %-100 %] 96 % (09/09 0947) Weight:  [70.6 kg (155 lb 10.3 oz)] 70.6 kg (155 lb 10.3 oz) (09/09 0600) Weight change: -2.4 kg (-5 lb 4.7 oz)  Intake/Output from previous day: 09/08 0701 - 09/09 0700 In: 680 [P.O.:120; I.V.:360; IV Piggyback:200] Out: 1402 [Urine:1400; Stool:2]  Intake/Output from this shift: Total I/O In: 130 [P.O.:60; I.V.:20; IV Piggyback:50] Out: -   Physical Exam: General appearance: alert, cooperative, appears stated age and no distress Neck: no adenopathy, no carotid bruit, no JVD, supple, symmetrical, trachea midline and thyroid not enlarged, symmetric, no tenderness/mass/nodules Lungs: clear to auscultation bilaterally Heart: regular rate and rhythm, S1, S2 normal, no murmur, click, rub or gallop Extremities: extremities normal, atraumatic, no cyanosis or edema  Lab Results: Results for orders placed during the hospital encounter of 06/11/12 (from the past 48 hour(s))  GLUCOSE, CAPILLARY     Status: Abnormal   Collection Time   06/14/12 11:25 AM      Component Value Range Comment   Glucose-Capillary 120 (*) 70 - 99 mg/dL    Comment 1 Notify RN     GLUCOSE, CAPILLARY     Status: Normal   Collection Time   06/14/12  4:07 PM      Component Value Range Comment   Glucose-Capillary 86  70 - 99 mg/dL   GLUCOSE, CAPILLARY     Status: Normal   Collection Time   06/14/12  9:30 PM      Component Value Range Comment   Glucose-Capillary 88  70 - 99 mg/dL   BASIC METABOLIC PANEL     Status: Abnormal   Collection Time   06/15/12  4:11 AM      Component Value Range Comment   Sodium 134 (*) 135 - 145 mEq/L    Potassium 3.7  3.5 - 5.1 mEq/L    Chloride 98  96 - 112 mEq/L    CO2 28  19 - 32 mEq/L    Glucose, Bld 89  70 - 99 mg/dL    BUN 22  6 - 23 mg/dL    Creatinine, Ser 1.61  0.50 - 1.35 mg/dL    Calcium 9.1  8.4 - 09.6 mg/dL    GFR calc non Af Amer 69 (*) >90 mL/min    GFR calc Af Amer 80 (*) >90 mL/min   CBC     Status: Abnormal   Collection Time   06/15/12  4:11 AM      Component Value Range Comment   WBC 9.2  4.0 - 10.5 K/uL    RBC 2.88 (*) 4.22 - 5.81 MIL/uL    Hemoglobin 8.7 (*) 13.0 - 17.0 g/dL    HCT 04.5 (*) 40.9 - 52.0 %    MCV 86.8  78.0 - 100.0 fL    MCH 30.2  26.0 - 34.0 pg    MCHC 34.8  30.0 - 36.0 g/dL    RDW 81.1  91.4 - 78.2 %    Platelets 138 (*) 150 - 400 K/uL   GLUCOSE, CAPILLARY     Status: Normal   Collection Time   06/15/12  7:28 AM      Component Value Range Comment   Glucose-Capillary  79  70 - 99 mg/dL   GLUCOSE, CAPILLARY     Status: Normal   Collection Time   06/15/12 11:38 AM      Component Value Range Comment   Glucose-Capillary 87  70 - 99 mg/dL   GLUCOSE, CAPILLARY     Status: Normal   Collection Time   06/15/12  3:28 PM      Component Value Range Comment   Glucose-Capillary 86  70 - 99 mg/dL   GLUCOSE, CAPILLARY     Status: Normal   Collection Time   06/15/12  9:52 PM      Component Value Range Comment   Glucose-Capillary 97  70 - 99 mg/dL   CBC     Status: Abnormal   Collection Time   06/16/12  3:45 AM      Component Value Range Comment   WBC 10.6 (*) 4.0 - 10.5 K/uL    RBC 3.06 (*) 4.22 - 5.81 MIL/uL    Hemoglobin 9.3 (*) 13.0 - 17.0 g/dL    HCT 16.1 (*) 09.6 - 52.0 %    MCV 86.9  78.0 - 100.0 fL    MCH 30.4  26.0 - 34.0 pg    MCHC 35.0  30.0 - 36.0 g/dL    RDW 04.5  40.9 - 81.1 %    Platelets 205  150 - 400 K/uL   BASIC METABOLIC PANEL     Status: Abnormal   Collection Time   06/16/12  3:45 AM      Component Value Range Comment   Sodium 136  135 - 145 mEq/L    Potassium 3.2 (*) 3.5 - 5.1 mEq/L    Chloride 97  96 - 112 mEq/L    CO2 30  19 - 32 mEq/L    Glucose, Bld 91  70 - 99 mg/dL    BUN 21  6 - 23 mg/dL    Creatinine, Ser  9.14  0.50 - 1.35 mg/dL    Calcium 9.2  8.4 - 78.2 mg/dL    GFR calc non Af Amer 74 (*) >90 mL/min    GFR calc Af Amer 86 (*) >90 mL/min   GLUCOSE, CAPILLARY     Status: Normal   Collection Time   06/16/12  7:39 AM      Component Value Range Comment   Glucose-Capillary 95  70 - 99 mg/dL    Comment 1 Documented in Chart      Comment 2 Notify RN       Imaging: Imaging results have been reviewed  Assessment/Plan:   1. Principal Problem: 2.  *Cardiac arrest - out of hospital, witnessed, CPR, defib in field 3. Active Problems: 4.  STEMI (ST elevation myocardial infarction) 5.  Tobacco abuse 6.  HLD (hyperlipidemia) 7.  Ischemic cardiomyopathy, EF 30-35% at cath 8.  PVD, 90% Rt RAS, 80% Rt Iliac at cath 9.  CAD, 90% LM, Total RCA, 80-90% CFX- S/P urgent CABG X 4 10.   Time Spent Directly with Patient:  20 minutes  Length of Stay:  LOS: 5 days   POD # 5 emergent CABG X 4. Looks great. NSR. VSS. Labs OK. Exam benign. Transfer to tele per TCTS. Can add ACE-I and advance BB (HR in 90s now).  Humbert Morozov J 06/16/2012, 10:09 AM

## 2012-06-17 ENCOUNTER — Inpatient Hospital Stay (HOSPITAL_COMMUNITY): Payer: BC Managed Care – PPO

## 2012-06-17 LAB — BASIC METABOLIC PANEL
BUN: 20 mg/dL (ref 6–23)
CO2: 26 mEq/L (ref 19–32)
Calcium: 9.8 mg/dL (ref 8.4–10.5)
Chloride: 100 mEq/L (ref 96–112)
Creatinine, Ser: 1.07 mg/dL (ref 0.50–1.35)
GFR calc Af Amer: 87 mL/min — ABNORMAL LOW (ref >90)
GFR calc non Af Amer: 75 mL/min — ABNORMAL LOW (ref >90)
Glucose, Bld: 103 mg/dL — ABNORMAL HIGH (ref 70–99)
Potassium: 3.6 mEq/L (ref 3.5–5.1)
Sodium: 141 mEq/L (ref 135–145)

## 2012-06-17 LAB — CBC
HCT: 28.4 % — ABNORMAL LOW (ref 39.0–52.0)
Hemoglobin: 10 g/dL — ABNORMAL LOW (ref 13.0–17.0)
MCH: 30.3 pg (ref 26.0–34.0)
MCHC: 35.2 g/dL (ref 30.0–36.0)
MCV: 86.1 fL (ref 78.0–100.0)
Platelets: 309 10*3/uL (ref 150–400)
RBC: 3.3 MIL/uL — ABNORMAL LOW (ref 4.22–5.81)
RDW: 13 % (ref 11.5–15.5)
WBC: 12.6 10*3/uL — ABNORMAL HIGH (ref 4.0–10.5)

## 2012-06-17 MED ORDER — POTASSIUM CHLORIDE CRYS ER 20 MEQ PO TBCR: 20.0000 meq | EXTENDED_RELEASE_TABLET | Freq: Every day | ORAL | Status: DC

## 2012-06-17 MED ORDER — OXYCODONE HCL 5 MG PO TABS: 5.0000 mg | ORAL_TABLET | ORAL | Status: AC | PRN

## 2012-06-17 MED ORDER — METOPROLOL TARTRATE 12.5 MG HALF TABLET: 37.5000 mg | ORAL_TABLET | Freq: Two times a day (BID) | ORAL | Status: DC

## 2012-06-17 MED ORDER — METOPROLOL TARTRATE 25 MG PO TABS
25.0000 mg | ORAL_TABLET | Freq: Two times a day (BID) | ORAL | Status: DC
  Filled 2012-06-17 (×2): qty 1

## 2012-06-17 MED ORDER — ASPIRIN 325 MG PO TBEC: 325.0000 mg | DELAYED_RELEASE_TABLET | Freq: Every day | ORAL | Status: DC

## 2012-06-17 MED ORDER — NICOTINE 21 MG/24HR TD PT24: 1.0000 | MEDICATED_PATCH | Freq: Every day | TRANSDERMAL | Status: AC

## 2012-06-17 MED ORDER — METOPROLOL TARTRATE 25 MG PO TABS
37.5000 mg | ORAL_TABLET | Freq: Two times a day (BID) | ORAL | Status: DC
  Administered 2012-06-17 – 2012-06-18 (×3): 37.5 mg via ORAL
  Filled 2012-06-17 (×5): qty 1

## 2012-06-17 MED ORDER — FUROSEMIDE 40 MG PO TABS: 40.0000 mg | ORAL_TABLET | Freq: Every day | ORAL | Status: DC

## 2012-06-17 MED ORDER — LISINOPRIL 10 MG PO TABS: 10.0000 mg | ORAL_TABLET | Freq: Every day | ORAL | Status: DC

## 2012-06-17 MED ORDER — DIGOXIN 250 MCG PO TABS: 0.2500 mg | ORAL_TABLET | Freq: Every day | ORAL | Status: DC

## 2012-06-17 NOTE — Progress Notes (Signed)
CARDIAC REHAB PHASE I   PRE:  Rate/Rhythm: 96 SR    BP: sitting [address]    SaO2: 97 RA  MODE:  Ambulation: 800 ft   POST:  Rate/Rhythm: 107    BP: sitting 60454     SaO2: 96 RA  Tolerated well. Less SOB today. Increased distance. Ed completed with family present. Requests his name be sent to [facility]. Motivated to quit smoking. [phone]  Harriet Masson CES, ACSM

## 2012-06-17 NOTE — Progress Notes (Signed)
Subjective: Up in chair, progressing well  Objective: Vital signs in last 24 hours: Temp:  [97.7 F (36.5 C)-99 F (37.2 C)] 97.7 F (36.5 C) (09/10 0800) Pulse Rate:  [85-101] 98  (09/10 0800) Resp:  [19-27] 20  (09/10 0800) BP: (128-155)/(62-97) 128/73 mmHg (09/10 0800) SpO2:  [94 %-100 %] 99 % (09/10 0800) Weight:  [69.718 kg (153 lb 11.2 oz)] 69.718 kg (153 lb 11.2 oz) (09/10 0333) Weight change: -0.882 kg (-1 lb 15.1 oz) Last BM Date: 06/16/12 Intake/Output from previous day:+424 09/09 0701 - 09/10 0700 In: 410 [P.O.:300; I.V.:60; IV Piggyback:50] Out: 6 [Urine:5; Stool:1] Intake/Output this shift: Total I/O In: 360 [P.O.:360] Out: -   PE: General:alert and oriented, no specific complaints Heart:S1S2 RRR Lungs:clear Abd:+ BS ZOX:WRUEA edema Neuro: alert and oriented  Lab Results:  Basename 06/17/12 0520 06/16/12 0345  WBC 12.6* 10.6*  HGB 10.0* 9.3*  HCT 28.4* 26.6*  PLT 309 205   BMET  Basename 06/17/12 0520 06/16/12 0345  NA 141 136  K 3.6 3.2*  CL 100 97  CO2 26 30  GLUCOSE 103* 91  BUN 20 21  CREATININE 1.07 1.08  CALCIUM 9.8 9.2   Lab Results  Component Value Date   HGBA1C 5.3 06/14/2012     No results found for this basename: TSH       EKG: Orders placed during the hospital encounter of 06/11/12  . EKG 12-LEAD  . EKG 12-LEAD  . EKG 12-LEAD  . EKG 12-LEAD  . EKG 12-LEAD  . EKG 12-LEAD    Studies/Results: Dg Chest 2 View  06/17/2012  *RADIOLOGY REPORT*  Clinical Data: Status post CABG.  CHEST - 2 VIEW  Comparison: Chest x-ray 06/16/2012.  Findings: Mild elevation of the left hemidiaphragm.  Persistent but decreasing linear opacities in the left lower lobe, compatible with resolving postoperative subsegmental atelectasis.  Small left pleural effusion is unchanged.  Linear densities in the mid lungs bilaterally are resolving, most compatible with subsegmental atelectasis.  Pulmonary vasculature is normal.  The heart size is now within  normal limits.  Mediastinal contours are unremarkable. Bilateral apical pleural parenchymal scarring and architectural distortion is similar to prior examinations.  No definite pneumothorax is identified. Status post median sternotomy for CABG. Epicardial pacing wires remain in position.  Previously noted right IJ Cordis has been removed.  IMPRESSION: 1.  Status post median sternotomy for CABG with improving aeration consistent with resolving areas of subsegmental atelectasis throughout the lungs bilaterally. 2.  Small left pleural effusion is unchanged. 3.  No pneumothorax confidently identified.   Original Report Authenticated By: Florencia Reasons, M.D.    Dg Chest Port 1 View  06/16/2012  *RADIOLOGY REPORT*  Clinical Data: Ischemic cardiac event.  Status post CABG.  PORTABLE CHEST - 1 VIEW  Comparison: 06/15/2012.  Findings: Right IJ catheter sheath is stable in position.  Right chest tube has been removed.  Probable trace right apical pneumothorax.  Biapical pleural parenchymal scarring and mild bullous changes.  Linear densities are seen in the midlung zones bilaterally.  Epicardial pacer wires remain in place.  Tiny left pleural effusion with left basilar volume loss.  No edema.  IMPRESSION:  1.  Probable tiny right apical pneumothorax after removal of right chest tube. 2.  Scattered linear atelectasis in the midlung zones. 3.  Left basilar volume loss and tiny left pleural effusion.   Original Report Authenticated By: Reyes Ivan, M.D.     Medications:     . acetaminophen  1,000 mg Oral Q6H  . ALPRAZolam  1 mg Oral QHS  . aspirin EC  325 mg Oral Daily  . atorvastatin  20 mg Oral q1800  . budesonide  0.5 mg Nebulization BID  . digoxin  0.25 mg Oral Daily  . feeding supplement  237 mL Oral BID BM  . furosemide  40 mg Oral Daily  . guaiFENesin  600 mg Oral BID  . insulin aspart  0-24 Units Subcutaneous TID AC & HS  . levalbuterol  0.63 mg Nebulization BID  . lisinopril  10 mg Oral  Daily  . metoprolol tartrate  25 mg Oral BID  . moving right along book   Does not apply Once  . nicotine  21 mg Transdermal Daily  . pantoprazole  40 mg Oral Q1200  . pneumococcal 23 valent vaccine  0.5 mL Intramuscular Once  . potassium chloride  20 mEq Oral Daily  . sodium chloride  3 mL Intravenous Q12H  . sodium chloride  3 mL Intravenous Q12H  . DISCONTD: acetaminophen (TYLENOL) oral liquid 160 mg/5 mL  975 mg Per Tube Q6H  . DISCONTD: aspirin  324 mg Per Tube Daily  . DISCONTD: levalbuterol  0.63 mg Nebulization Q6H  . DISCONTD: metoprolol tartrate  12.5 mg Oral BID    Assessment/Plan: Principal Problem:  *Cardiac arrest - out of hospital, witnessed, CPR, defib in field Active Problems:  STEMI (ST elevation myocardial infarction)  Tobacco abuse  HLD (hyperlipidemia)  Ischemic cardiomyopathy, EF 30-35% at cath  PVD, 90% Rt RAS, 80% Rt Iliac at cath  CAD, 90% LM, Total RCA, 80-90% CFX- S/P urgent CABG X 4  PLAN: 6 Days Post-Op Procedure(s) (LRB):  CORONARY ARTERY BYPASS GRAFTING (CABG) ([name])   Will check 2D echo today, continues with PVCs and if EF continues to be low will need lifevest.    See CXR. Will follow up with Dr. [name] in 2 weeks post discharge.  LOS: 6 days   INGOLD,LAURA R 06/17/2012, 9:11 AM   Agree with note written by Nada Boozer RNP  POD # 6 emergent CABG X 4. Looks great!!! Probably home today. Need to get a 2D echo this AM for LV fxn prior to D/C to decide re LifeVest. Adjust BB. ROV with me 2-3 weeks.  Runell Gess 06/17/2012 9:26 AM

## 2012-06-17 NOTE — Progress Notes (Signed)
  Echocardiogram 2D Echocardiogram has been performed.  Cathie Beams 06/17/2012, 3:17 PM

## 2012-06-17 NOTE — Discharge Summary (Signed)
patient examined and medical record reviewed,agree with above note. Jonathon Ryan,Jonathon Ryan 06/17/2012

## 2012-06-17 NOTE — Clinical Social Work Psych Assess (Signed)
Clinical Social Work Department BRIEF PSYCHOSOCIAL ASSESSMENT 06/17/2012  Patient:  Jonathon Ryan, Jonathon Ryan     Account Number:  [id number]     Admit date:  06/11/2012  Clinical Social Worker:  Read Drivers  Date/Time:  06/17/2012 05:27 PM  Referred by:  RN  Date Referred:  06/17/2012 Referred for  Other - See comment   Other Referral:   disability  finacial assistance with hospital bills   Interview type:  Other - See comment Other interview type:   pt and friend Neysa Bonito and wife Bandera Lions    PSYCHOSOCIAL DATA Living Status:  FAMILY Admitted from facility:   Level of care:   Primary support name:  Enetai Lions Primary support relationship to patient:  SPOUSE Degree of support available:   good    CURRENT CONCERNS Current Concerns  Financial Resources   Other Concerns:   disability  mounting hospital bills    SOCIAL WORK ASSESSMENT / PLAN CSW assessed pt at beside with wife, Leland Lions and friend, Neysa Bonito.  Family and pt was concerned about mounting hospital bills.  Pt wanted to make sure that they were paid.  CSW called and left a message for Financial Counselor to contact wife.  CSW provided wife's cell phone as [phone] and home number as [phone].  CSW asked financial counselor to f/u with pt re: possible financial assistance with portion of hospital bill that insurance did not cover.  Pt and family was also concerned with the amount of time that the pt would be out of work and wanted to know if he were eligible for disability.  CSW referred family to financial counselor and to community resources re: disability.  Per RNCM, a lot of the determiniation of disability is pending decision of life vest.  Relayed this information to pt and family.  As of 5:15pm pt had not seen MD to hear test results and see whether or not the life vest was necessary.   Assessment/plan status:  No Further Intervention Required Other assessment/ plan:   none   Information/referral to community resources:     disability services  financial counselor    PATIENT'S/FAMILY'S RESPONSE TO PLAN OF CARE: pt and family was very Adult nurse of CSW assistance.        Vickii Penna, LCSWA [phone]  Clinical Social Work

## 2012-06-17 NOTE — Progress Notes (Signed)
[address]            [place] [address]          [phone]     6 Days Post-Op  Procedure(s) (LRB): CORONARY ARTERY BYPASS GRAFTING (CABG) ([name]) Subjective: Feels well  Objective  Telemetry sinus with pvc's  Temp:  [97.7 F (36.5 C)-99 F (37.2 C)] 97.7 F (36.5 C) (09/10 0800) Pulse Rate:  [85-101] 98  (09/10 0800) Resp:  [19-27] 20  (09/10 0800) BP: (102-155)/(62-97) 128/73 mmHg (09/10 0800) SpO2:  [94 %-100 %] 99 % (09/10 0800) Weight:  [153 lb 11.2 oz (69.718 kg)] 153 lb 11.2 oz (69.718 kg) (09/10 0333)   Intake/Output Summary (Last 24 hours) at 06/17/12 0852 Last data filed at 06/17/12 0800  Gross per 24 hour  Intake    640 ml  Output      6 ml  Net    634 ml       General appearance: alert, cooperative and no distress Heart: regular rate and rhythm, S1, S2 normal and occ extrasystole Lungs: min dim in bases Abdomen: soft, nontender Extremities: trace edema Wound: incisions healing well  Lab Results:  Basename 06/17/12 0520 06/16/12 0345  NA 141 136  K 3.6 3.2*  CL 100 97  CO2 26 30  GLUCOSE 103* 91  BUN 20 21  CREATININE 1.07 1.08  CALCIUM 9.8 9.2  MG -- --  PHOS -- --   No results found for this basename: AST:2,ALT:2,ALKPHOS:2,BILITOT:2,PROT:2,ALBUMIN:2 in the last 72 hours No results found for this basename: LIPASE:2,AMYLASE:2 in the last 72 hours  Basename 06/17/12 0520 06/16/12 0345  WBC 12.6* 10.6*  NEUTROABS -- --  HGB 10.0* 9.3*  HCT 28.4* 26.6*  MCV 86.1 86.9  PLT 309 205   No results found for this basename: CKTOTAL:4,CKMB:4,TROPONINI:4 in the last 72 hours No components found with this basename: POCBNP:3 No results found for this basename: DDIMER in the last 72 hours No results found for this basename: HGBA1C in the last 72 hours No results found for this basename: CHOL,HDL,LDLCALC,TRIG,CHOLHDL in the last 72 hours No results found for this basename: TSH,T4TOTAL,FREET3,T3FREE,THYROIDAB in  the last 72 hours No results found for this basename: VITAMINB12,FOLATE,FERRITIN,TIBC,IRON,RETICCTPCT in the last 72 hours  Medications: Scheduled    . acetaminophen  1,000 mg Oral Q6H  . ALPRAZolam  1 mg Oral QHS  . aspirin EC  325 mg Oral Daily  . atorvastatin  20 mg Oral q1800  . budesonide  0.5 mg Nebulization BID  . digoxin  0.25 mg Oral Daily  . feeding supplement  237 mL Oral BID BM  . furosemide  40 mg Oral Daily  . guaiFENesin  600 mg Oral BID  . insulin aspart  0-24 Units Subcutaneous TID AC & HS  . levalbuterol  0.63 mg Nebulization BID  . lisinopril  10 mg Oral Daily  . metoprolol tartrate  12.5 mg Oral BID  . moving right along book   Does not apply Once  . nicotine  21 mg Transdermal Daily  . pantoprazole  40 mg Oral Q1200  . pneumococcal 23 valent vaccine  0.5 mL Intramuscular Once  . potassium chloride  20 mEq Oral Daily  . sodium chloride  3 mL Intravenous Q12H  . sodium chloride  3 mL Intravenous Q12H  . DISCONTD: acetaminophen (TYLENOL) oral liquid 160 mg/5 mL  975 mg Per  Tube Q6H  . DISCONTD: aspirin  324 mg Per Tube Daily  . DISCONTD: levalbuterol  0.63 mg Nebulization Q6H     Radiology/Studies:  Dg Chest Port 1 View  06/16/2012  *RADIOLOGY REPORT*  Clinical Data: Ischemic cardiac event.  Status post CABG.  PORTABLE CHEST - 1 VIEW  Comparison: 06/15/2012.  Findings: Right IJ catheter sheath is stable in position.  Right chest tube has been removed.  Probable trace right apical pneumothorax.  Biapical pleural parenchymal scarring and mild bullous changes.  Linear densities are seen in the midlung zones bilaterally.  Epicardial pacer wires remain in place.  Tiny left pleural effusion with left basilar volume loss.  No edema.  IMPRESSION:  1.  Probable tiny right apical pneumothorax after removal of right chest tube. 2.  Scattered linear atelectasis in the midlung zones. 3.  Left basilar volume loss and tiny left pleural effusion.   Original Report Authenticated By:  Reyes Ivan, M.D.     INR: Will add last result for INR, ABG once components are confirmed Will add last 4 CBG results once components are confirmed  Assessment/Plan: S/P Procedure(s) (LRB): CORONARY ARTERY BYPASS GRAFTING (CABG) ([name])  1. conts to do very well 2 increase beta blocker 3 ? Life-vest at d/c/EP eval 4 wbc sl elevated, H/H stable 5 Not certain he should be on digoxin?  LOS: 6 days    GOLD,WAYNE E 9/10/20138:52 AM

## 2012-06-17 NOTE — Discharge Summary (Addendum)
[address]            [place] [address]          [phone]      Jonathon Ryan 1956-09-12 56 y.o. [id number]  06/11/2012   [name], MD  cpr stemi ischemic cardiac event   HPI: The patient is a 56 year old male with a past medical history that includes hyperlipidemia and tobacco abuse. He was recently seen by his primary care physician and was told his cholesterol was fine. On the date of admission he presented to the emergency room following a witnessed ventricular fibrillation arrest while at work. Coworkers were with him when he began to feel poorly. He collapsed and was caught and laid to the ground were CPR was initiated within a minute. EMS arrived within 2-5 minutes and he was shocked 4 times for ventricular fibrillation. Code stemI was initiated and arctic sun protocol was begun on arrival. He was emergently brought to the cardiac catheterization lab for potential intervention.    History reviewed. No pertinent past medical history.  History reviewed. No pertinent past surgical history.  History reviewed. No pertinent family history.  Social History: does not have a smoking history on file. He does not have any smokeless tobacco history on file. His alcohol and drug histories not on file.  Allergies: Allergies not on file  No prescriptions prior to admission    Hospital course:  The patient was taken to cardiac catheterization promptly under the care and management of Dr. Nanetta Batty. He was found to have severe left main and three-vessel coronary artery disease with moderate to severe left ventricular dysfunction. Urgent cardiothoracic surgical consultation was obtained with Dr. Donata Clay who evaluated the patient and his studies and agreed with recommendations to proceed with emergent coronary artery surgical revascularization.  Procedure: The patient was taken urgently to the operating room on 06/11/2012 and underwent the  following procedure:  OPERATIVE REPORT  OPERATION:  1. Emergency coronary artery bypass grafting x4 (left internal mammary  artery to left anterior descending, saphenous vein graft to  posterior descending, sequential saphenous vein graft to obtuse  marginal 1 and obtuse marginal 2).  2. Endoscopic harvest of right leg greater saphenous vein.  PREOPERATIVE DIAGNOSES:  1. Acute myocardial infarction with ventricular fibrillation arrest  requiring CPR and intubation.  2. Severe left main and three-vessel coronary disease with severe left  ventricular dysfunction.  3. Preoperative intra-aortic balloon pump.  POSTOPERATIVE DIAGNOSES:  1. Acute myocardial infarction with ventricular fibrillation arrest  requiring CPR and intubation.  2. Severe left main and three-vessel coronary disease with severe left  ventricular dysfunction.  3. Preoperative intra-aortic balloon pump.  SURGEON: [name], M.D.  ASSISTANT: [name], P.A.-C.  ANESTHESIA: General by Dr. [name].  Post operative hospital course:  The patient was initially brought to the surgical intensive care unit on low dose levofed, milrinone and dopamine. He was on an intra-aortic balloon pump with a 1:1 ratio. He was kept on the ventilator overnight without attempted wean. He has remained neurologically intact the balloon pump as well as inotropic support were weaned without significant difficulty as was the ventilator. He initially did have some mild elevation of his creatinine but this has stabilized with time and he has been started on an ACE inhibitor. All routine lines, monitors and drainage devices have been discontinued in the  standard fashion. He does have an expected acute blood loss anemia which has stabilized. He has had a moderate postoperative volume overload and is responding well to diuretics which will continue a short-term as an outpatient. Incisions are healing well without evidence of infection. He has  premature ventricular contractions but no other significant cardiac dysrhythmias. Beta blocker has been uptitrated. He is tolerating gradually increasing activities using standard protocols. Currently the plan is to obtain an echocardiogram on today's date to reevaluate LV function. Depending on results he may require a life vest prior to discharge. Otherwise he is felt to be stable for discharge in the next approximately 24-48 hours.     Basename 06/17/12 0520 06/16/12 0345  NA 141 136  K 3.6 3.2*  CL 100 97  CO2 26 30  GLUCOSE 103* 91  BUN 20 21  CALCIUM 9.8 9.2    Basename 06/17/12 0520 06/16/12 0345  WBC 12.6* 10.6*  HGB 10.0* 9.3*  HCT 28.4* 26.6*  PLT 309 205   No results found for this basename: INR:2 in the last 72 hours   Discharge Instructions:  The patient is discharged to home with extensive instructions on wound care and progressive ambulation.  They are instructed not to drive or perform any heavy lifting until returning to see the physician in his office.  Discharge Diagnosis:  cpr stemi ischemic cardiac event  Secondary Diagnosis: Patient Active Problem List  Diagnosis  . STEMI (ST elevation myocardial infarction)  . Tobacco abuse  . HLD (hyperlipidemia)  . Cardiac arrest - out of hospital, witnessed, CPR, defib in field  . Ischemic cardiomyopathy, EF 30-35% at cath  . PVD, 90% Rt RAS, 80% Rt Iliac at cath  . CAD, 90% LM, Total RCA, 80-90% CFX- S/P urgent CABG X 4   Past Medical History  Diagnosis Date  . No pertinent past medical history        Jonathon Ryan, Jonathon Ryan  Home Medication Instructions ZOX:[id number]   Printed on:06/17/12 1025  Medication Information                    atorvastatin (LIPITOR) 20 MG tablet Take 20 mg by mouth daily.           Lansoprazole (PREVACID PO) Take 1 capsule by mouth daily.           aspirin EC 325 MG EC tablet Take 1 tablet (325 mg total) by mouth daily.           digoxin (LANOXIN) 0.25 MG tablet Take 1 tablet  (0.25 mg total) by mouth daily.           furosemide (LASIX) 40 MG tablet Take 1 tablet (40 mg total) by mouth daily.           lisinopril (PRINIVIL,ZESTRIL) 10 MG tablet Take 1 tablet (10 mg total) by mouth daily.           metoprolol tartrate (LOPRESSOR) 12.5 mg TABS Take 1.5 tablets (37.5 mg total) by mouth 2 (two) times daily.           nicotine (NICODERM CQ - DOSED IN MG/24 HOURS) 21 mg/24hr patch Place 1 patch onto the skin daily.           oxyCODONE (OXY IR/ROXICODONE) 5 MG immediate release tablet Take 1-2 tablets (5-10 mg total) by mouth every 4 (four) hours as needed.           potassium chloride SA (K-DUR,KLOR-CON) 20 MEQ tablet Take 1 tablet (  20 mEq total) by mouth daily.             Disposition: For discharge home  Patient's condition is Good  Gershon Crane, PA-C 06/17/2012  10:25 AM

## 2012-06-18 LAB — GLUCOSE, CAPILLARY
Glucose-Capillary: 99 mg/dL (ref 70–99)
Glucose-Capillary: 103 mg/dL — ABNORMAL HIGH (ref 70–99)

## 2012-06-18 MED ORDER — METOPROLOL TARTRATE 50 MG PO TABS: 50.0000 mg | ORAL_TABLET | Freq: Two times a day (BID) | ORAL | Status: DC

## 2012-06-18 MED ORDER — METOPROLOL TARTRATE 50 MG PO TABS
50.0000 mg | ORAL_TABLET | Freq: Two times a day (BID) | ORAL | Status: DC
Start: 2012-06-18 — End: 2012-06-18
  Filled 2012-06-18: qty 1

## 2012-06-18 MED ORDER — INFLUENZA VIRUS VACC SPLIT PF IM SUSP
0.5000 mL | INTRAMUSCULAR | Status: AC
  Administered 2012-06-18: 0.5 mL via INTRAMUSCULAR

## 2012-06-18 NOTE — Progress Notes (Signed)
Subjective:  Up without problems  Objective:  Vital Signs in the last 24 hours: Temp:  [98.3 F (36.8 C)-99.1 F (37.3 C)] 99.1 F (37.3 C) (09/11 0425) Pulse Rate:  [82-101] 90  (09/11 0425) Resp:  [18-20] 18  (09/11 0425) BP: (128-140)/(59-73) 139/61 mmHg (09/11 0425) SpO2:  [96 %-97 %] 96 % (09/11 0425) Weight:  [68.947 kg (152 lb)] 68.947 kg (152 lb) (09/11 0425)  Intake/Output from previous day:  Intake/Output Summary (Last 24 hours) at 06/18/12 1001 Last data filed at 06/18/12 0730  Gross per 24 hour  Intake    843 ml  Output    302 ml  Net    541 ml    Physical Exam: General appearance: alert, cooperative and no distress Lungs: clear to auscultation bilaterally Heart: regular rate and rhythm   Rate: 88  Rhythm: normal sinus rhythm  Lab Results:  Basename 06/17/12 0520 06/16/12 0345  WBC 12.6* 10.6*  HGB 10.0* 9.3*  PLT 309 205    Basename 06/17/12 0520 06/16/12 0345  NA 141 136  K 3.6 3.2*  CL 100 97  CO2 26 30  GLUCOSE 103* 91  BUN 20 21  CREATININE 1.07 1.08   No results found for this basename: TROPONINI:2,CK,MB:2 in the last 72 hours Hepatic Function Panel No results found for this basename: PROT,ALBUMIN,AST,ALT,ALKPHOS,BILITOT,BILIDIR,IBILI in the last 72 hours No results found for this basename: CHOL in the last 72 hours No results found for this basename: INR in the last 72 hours  Imaging: Imaging results have been reviewed  Cardiac Studies:  Assessment/Plan:   Principal Problem:  *Cardiac arrest - out of hospital, witnessed, CPR, defib in field Active Problems:  STEMI (ST elevation myocardial infarction)  CAD, 90% LM, Total RCA, 80-90% CFX- S/P urgent CABG X 4  Ischemic cardiomyopathy, EF 25-30% by echo 06/17/12  PVD, 90% Rt RAS, 80% Rt Iliac at cath  Tobacco abuse  HLD (hyperlipidemia)  Plan- Increase Lopressor to 50mg  BID. He will need a Technical sales engineer and I will contact the today.  Corine Shelter PA-C 06/18/2012, 10:01 AM    I  have seen and examined the patient along with Corine Shelter PA-C.  I have reviewed the chart, notes and new data.  I agree with PA's note.  No signs or symptoms of CHF. No ventricular arrhythmia. Temp PM wires removed. Waiting for Life Vest fitting. Possible DC later today  Had a long discussion re: LifeVest, reevaluation of LVEF and possible AICD implantation 90 days postop. Also discussed diet, sodium restriction, daily weights and signs and symptoms of CHF at length.  [name], MD, [facility] [phone] 06/18/2012, 1:37 PM

## 2012-06-18 NOTE — Progress Notes (Signed)
D/C home with instructions, r/x, and f/u appointments. Life vest fitted. Pt and wife verbalized understanding, pt home with wife.

## 2012-06-18 NOTE — Progress Notes (Signed)
1000 Observed pt walking independently with steady gait. Marleny Faller DunlapRN

## 2012-06-18 NOTE — Progress Notes (Signed)
D/C right flank incision suture, steris applied. Slight drainage noted, 2x2 applied.

## 2012-06-18 NOTE — Progress Notes (Signed)
D/C EPW and CTS per protocol and as ordered, all ends intact. No bleeding/ectopy noted, pt tolerated well. Reminded to lie supine approximately one hour.  INR 1.53   VSS. CTS removed, steris applied.  Will continue to monitor.

## 2012-06-18 NOTE — Progress Notes (Signed)
Pt ambulated 350 ft independently, steady gait.  Will continue to monitor.

## 2012-06-18 NOTE — Progress Notes (Signed)
[address]            [place] [address]          [phone]     7 Days Post-Op  Procedure(s) (LRB): CORONARY ARTERY BYPASS GRAFTING (CABG) ([name]) Subjective: Feeling well overall, echo results noted below  Objective  Telemetry SR/BBB, PVC's   Temp:  [98.3 F (36.8 C)-99.1 F (37.3 C)] 99.1 F (37.3 C) (09/11 0425) Pulse Rate:  [82-101] 90  (09/11 0425) Resp:  [18-20] 18  (09/11 0425) BP: (128-140)/(59-73) 139/61 mmHg (09/11 0425) SpO2:  [96 %-98 %] 96 % (09/11 0425) Weight:  [152 lb (68.947 kg)] 152 lb (68.947 kg) (09/11 0425)   Intake/Output Summary (Last 24 hours) at 06/18/12 0810 Last data filed at 06/17/12 1800  Gross per 24 hour  Intake    483 ml  Output    302 ml  Net    181 ml       General appearance: alert, cooperative and no distress Heart: regular rate and rhythm and S1, S2 normal Lungs: clear to auscultation bilaterally Abdomen: benign Extremities: trace edema Wound: incisions healing well  Lab Results:  Basename 06/17/12 0520 06/16/12 0345  NA 141 136  K 3.6 3.2*  CL 100 97  CO2 26 30  GLUCOSE 103* 91  BUN 20 21  CREATININE 1.07 1.08  CALCIUM 9.8 9.2  MG -- --  PHOS -- --   No results found for this basename: AST:2,ALT:2,ALKPHOS:2,BILITOT:2,PROT:2,ALBUMIN:2 in the last 72 hours No results found for this basename: LIPASE:2,AMYLASE:2 in the last 72 hours  Basename 06/17/12 0520 06/16/12 0345  WBC 12.6* 10.6*  NEUTROABS -- --  HGB 10.0* 9.3*  HCT 28.4* 26.6*  MCV 86.1 86.9  PLT 309 205   No results found for this basename: CKTOTAL:4,CKMB:4,TROPONINI:4 in the last 72 hours No components found with this basename: POCBNP:3 No results found for this basename: DDIMER in the last 72 hours No results found for this basename: HGBA1C in the last 72 hours No results found for this basename: CHOL,HDL,LDLCALC,TRIG,CHOLHDL in the last 72 hours No results found for this basename:  TSH,T4TOTAL,FREET3,T3FREE,THYROIDAB in the last 72 hours No results found for this basename: VITAMINB12,FOLATE,FERRITIN,TIBC,IRON,RETICCTPCT in the last 72 hours  Medications: Scheduled    . ALPRAZolam  1 mg Oral QHS  . aspirin EC  325 mg Oral Daily  . atorvastatin  20 mg Oral q1800  . budesonide  0.5 mg Nebulization BID  . digoxin  0.25 mg Oral Daily  . feeding supplement  237 mL Oral BID BM  . furosemide  40 mg Oral Daily  . guaiFENesin  600 mg Oral BID  . insulin aspart  0-24 Units Subcutaneous TID AC & HS  . levalbuterol  0.63 mg Nebulization BID  . lisinopril  10 mg Oral Daily  . metoprolol tartrate  37.5 mg Oral BID  . nicotine  21 mg Transdermal Daily  . pantoprazole  40 mg Oral Q1200  . pneumococcal 23 valent vaccine  0.5 mL Intramuscular Once  . potassium chloride  20 mEq Oral Daily  . sodium chloride  3 mL Intravenous Q12H  . sodium chloride  3 mL Intravenous Q12H  . DISCONTD: metoprolol tartrate  12.5 mg Oral BID  . DISCONTD: metoprolol tartrate  25 mg Oral BID   ------------------------------------------------------------ Transthoracic Echocardiography  Patient: Chan, Sheahan MR #: [id number] Study Date: 06/17/2012 Gender: Judie Petit  Age: 56 Height: 177.8cm Weight: 69.7kg BSA: 1.75m^2 Pt. Status: Room: 2034  ADMITTING Nanetta Batty, MD PERFORMING [facility] Reuben Likes REFERRING Leone Brand ATTENDING VanTrigt, Peter SONOGRAPHER Cathie Beams cc:  ------------------------------------------------------------ LV EF: 25% - 30%  ------------------------------------------------------------ Indications: MI - acute 410.91.  ------------------------------------------------------------ History: PMH: Assess ejection fraction. Cardiac arrest. Ishemic cardiomyopathy. Peripheral vascular disease. Coronary artery disease. Risk factors: Current tobacco use. Dyslipidemia.  ------------------------------------------------------------ Study  Conclusions  - Left ventricle: The cavity size was normal. Wall thickness was normal. There is coarse trabeculation of the LV wall, however, it does not meet criteria for LV non-compaction. There is moderate to severe anterior and lateral hypokinesis and inferior akinesis / probable scar. Systolic function was severely reduced. The estimated ejection fraction was in the range of 25% to 30%. Doppler parameters are consistent with diastolic dysfunction. - Mitral valve: Mildly thickened leaflets . Mild to moderate posteriorly directed mitral regurgitation. There appears to be tethering of the posterior mitral leaflet. - Left atrium: The atrium was normal in size. - Tricuspid valve: Trivial regurgitation. Jet is inadequate to estimate RVSP. - Systemic veins: The IVC is not well visualized. - Pericardium, extracardiac: There was no pericardial effusion.  ------------------------------------------------------------ Labs, prior tests, procedures, and surgery: Coronary artery bypass grafting.  Transthoracic echocardiography. M-mode, complete 2D, spectral Doppler, and color Doppler. Height: Height: 177.8cm. Height: 70in. Weight: Weight: 69.7kg. Weight: 153.3lb. Body mass index: BMI: 22kg/m^2. Body surface area: BSA: 1.1m^2. Blood pressure: 128/73. Patient status: Inpatient. Location: Echo laboratory.  ------------------------------------------------------------  ------------------------------------------------------------ Left ventricle: The cavity size was normal. Wall thickness was normal. There is coarse trabeculation of the LV wall, however, it does not meet criteria for LV non-compaction. Systolic function was severely reduced. The estimated ejection fraction was in the range of 25% to 30%. Doppler parameters are consistent with diastolic dysfunction.  ------------------------------------------------------------ Aortic valve: Structurally normal valve. Cusp separation was  normal. Doppler: Transvalvular velocity was within the normal range. There was no stenosis. No regurgitation.  ------------------------------------------------------------ Aorta: Aortic root: The aortic root was normal in size. Ascending aorta: The ascending aorta was normal in size.  ------------------------------------------------------------ Mitral valve: Mildly thickened leaflets . Doppler: Mild to moderate posteriorly directed mitral regurgitation. There appears to be tethering of the posterior mitral leaflet. Peak gradient: 4mm Hg (D).  ------------------------------------------------------------ Left atrium: The atrium was normal in size.  ------------------------------------------------------------ Atrial septum: No defect or patent foramen ovale was identified.  ------------------------------------------------------------ Right ventricle: The moderator band was prominent.  ------------------------------------------------------------ Pulmonic valve: The valve appears to be grossly normal. Doppler: No significant regurgitation.  ------------------------------------------------------------ Tricuspid valve: Poorly visualized. Doppler: Trivial regurgitation. Jet is inadequate to estimate RVSP.  ------------------------------------------------------------ Pulmonary artery: Poorly visualized.  ------------------------------------------------------------ Right atrium: The atrium was normal in size.  ------------------------------------------------------------ Pericardium: There was no pericardial effusion.  ------------------------------------------------------------ Systemic veins: The IVC is not well visualized.  ------------------------------------------------------------  2D measurements Normal Doppler Normal Left ventricle measurements LVID ED, 49.4 mm 43-52 Left ventricle chord, Ea, lat 11.3 cm/ ------- PLAX ann, tiss s LVID ES, 40.7 mm 23-38 DP chord, E/Ea,  lat 8.44 ------- PLAX ann, tiss FS, chord, 18 % >29 DP PLAX Ea, med 7.9 cm/ ------- LVPW, ED 9.65 mm ------ ann, tiss s IVS/LVPW 0.88 <1.3 DP ratio, ED E/Ea, med 12.08 ------- Ventricular septum ann, tiss IVS, ED 8.45 mm ------ DP Aorta Mitral valve Root diam, 33 mm ------ Peak E vel 95.4 cm/ ------- ED s Left atrium Peak A vel 87.7 cm/ ------- AP dim 33 mm ------ s  AP dim 1.78 cm/m^2 <2.2 Deceleratio 141 ms 150-230 index n time Peak 4 mm ------- gradient, D Hg Peak E/A 1.1 ------- ratio Right ventricle Sa vel, lat 9.76 cm/ ------- ann, tiss s DP  ------------------------------------------------------------ Prepared and Electronically Authenticated by  Zoila Shutter 2013-09-10T16:35:33.747   Radiology/Studies:  Dg Chest 2 View  06/17/2012  *RADIOLOGY REPORT*  Clinical Data: Status post CABG.  CHEST - 2 VIEW  Comparison: Chest x-ray 06/16/2012.  Findings: Mild elevation of the left hemidiaphragm.  Persistent but decreasing linear opacities in the left lower lobe, compatible with resolving postoperative subsegmental atelectasis.  Small left pleural effusion is unchanged.  Linear densities in the mid lungs bilaterally are resolving, most compatible with subsegmental atelectasis.  Pulmonary vasculature is normal.  The heart size is now within normal limits.  Mediastinal contours are unremarkable. Bilateral apical pleural parenchymal scarring and architectural distortion is similar to prior examinations.  No definite pneumothorax is identified. Status post median sternotomy for CABG. Epicardial pacing wires remain in position.  Previously noted right IJ Cordis has been removed.  IMPRESSION: 1.  Status post median sternotomy for CABG with improving aeration consistent with resolving areas of subsegmental atelectasis throughout the lungs bilaterally. 2.  Small left pleural effusion is unchanged. 3.  No pneumothorax confidently identified.   Original Report Authenticated By: Florencia Reasons, M.D.     INR: Will add last result for INR, ABG once components are confirmed Will add last 4 CBG results once components are confirmed  Assessment/Plan: S/P Procedure(s) (LRB): CORONARY ARTERY BYPASS GRAFTING (CABG) ([name])  1. Echo result warrant further eval to consider life-vest and poss EP involvement 2 will D/C EPW's 3 cont rehab/pulm rx 4 poss d/c soon   LOS: 7 days    GOLD,WAYNE E 9/11/20138:10 AM

## 2012-06-19 LAB — GLUCOSE, CAPILLARY

## 2012-06-19 NOTE — Discharge Summary (Signed)
patient examined and medical record reviewed,agree with above note. VAN TRIGT III,PETER 06/19/2012    

## 2012-06-26 ENCOUNTER — Telehealth: Payer: Self-pay | Admitting: *Deleted

## 2012-06-26 NOTE — Telephone Encounter (Signed)
Mrs. Vestal called with concerns that her husband has not been feeling as well as he did when he first came home.  He had CABG 06/11/12 and was d/c'd 06/17/12.  Today he is weak.  I asked them to take a pulse and blood pressure= 129/58/pulse 65. I talked with him and he said he had not had a bm in 2 days.  He talked to his Horticulturist, commercial and she suggested warm prune juice and he had a bm.  He feels much better.  I told him I would talk with him tomorrow and see if he still feels all right.  Otherwise, I will consult with Dr. Donata Clay and he agrees.

## 2012-06-27 ENCOUNTER — Telehealth: Payer: Self-pay | Admitting: *Deleted

## 2012-06-27 NOTE — Telephone Encounter (Signed)
This was a follow up call to see how Jonathon Ryan was feeling since I last talked to him yesterday.  According to his wife he is feeling much better. Will follow up as scheduled.

## 2012-07-08 ENCOUNTER — Other Ambulatory Visit: Payer: Self-pay | Admitting: Cardiothoracic Surgery

## 2012-07-08 DIAGNOSIS — I2510 Atherosclerotic heart disease of native coronary artery without angina pectoris: Secondary | ICD-10-CM

## 2012-07-09 ENCOUNTER — Ambulatory Visit
Admission: RE | Admit: 2012-07-09 | Discharge: 2012-07-09 | Disposition: A | Discharge status: Home / Self Care | Payer: BC Managed Care – PPO | Source: Ambulatory Visit | Attending: Cardiothoracic Surgery | Admitting: Cardiothoracic Surgery

## 2012-07-09 ENCOUNTER — Other Ambulatory Visit: Payer: Self-pay | Admitting: Cardiothoracic Surgery

## 2012-07-09 ENCOUNTER — Encounter: Payer: Self-pay | Admitting: Cardiothoracic Surgery

## 2012-07-09 ENCOUNTER — Ambulatory Visit (INDEPENDENT_AMBULATORY_CARE_PROVIDER_SITE_OTHER): Payer: Self-pay | Admitting: Cardiothoracic Surgery

## 2012-07-09 VITALS — BP 115/68 | HR 83 | Resp 18 | Ht 70.0 in | Wt 140.0 lb

## 2012-07-09 DIAGNOSIS — Z951 Presence of aortocoronary bypass graft: Secondary | ICD-10-CM

## 2012-07-09 DIAGNOSIS — I255 Ischemic cardiomyopathy: Secondary | ICD-10-CM

## 2012-07-09 DIAGNOSIS — I2510 Atherosclerotic heart disease of native coronary artery without angina pectoris: Secondary | ICD-10-CM

## 2012-07-09 DIAGNOSIS — I2589 Other forms of chronic ischemic heart disease: Secondary | ICD-10-CM

## 2012-07-09 NOTE — Progress Notes (Signed)
PCP is No primary provider on file. Referring Provider is Runell Gess, MD  Chief Complaint  Patient presents with  . Routine Post Op    3 week f/u with CXR, S/P CABG x 4 on 06/11/12    HPI: The patient is 56 years old and returns for one month followup after emergency CABG x4 for acute ischemic V. fib arrest, MI. He recovered well after emergency surgery and preoperative balloon pump. He had no neuro deficit on CT head.  He was discharged home in sinus rhythm on a beta blocker, ACE inhibitor, Lipitor, aspirin, and Lasix and potassium. His had no recurrent chest pain. He is walking daily. No symptoms of fluid retention or CHF. He still has considerable chest wall discomfort. He sleeps in a recliner. The surgical incisions are all healing.  The patient has successfully stopped smoking.   Past Medical History  Diagnosis Date  . No pertinent past medical history     Past Surgical History  Procedure Date  . No past surgeries   . Coronary artery bypass graft 06/11/2012    Procedure: CORONARY ARTERY BYPASS GRAFTING (CABG);  Surgeon: [name], MD;  Location: [facility] OR;  Service: Open Heart Surgery;  Laterality: [name];  Coronary Artery Bypass Grafting times four using left internal mammary artery and right greater saphenous vein endoscopically harvested.    No family history on file.  Social History History  Substance Use Topics  . Smoking status: Former Smoker -- 2.0 packs/day for 20 years    Types: Cigarettes    Start date: 06/11/2012  . Smokeless tobacco: Never Used  . Alcohol Use: No    Current Outpatient Prescriptions  Medication Sig Dispense Refill  . aspirin EC 325 MG EC tablet Take 1 tablet (325 mg total) by mouth daily.      [name] atorvastatin (LIPITOR) 20 MG tablet Take 20 mg by mouth daily.      . carvedilol (COREG) 12.5 MG tablet Take 12.5 mg by mouth 2 (two) times daily with a meal.       . digoxin (LANOXIN) 0.25 MG tablet Take 1 tablet (0.25 mg total) by mouth daily.   30 tablet  1  . Lansoprazole (PREVACID PO) Take 1 capsule by mouth daily.      [name] lisinopril (PRINIVIL,ZESTRIL) 10 MG tablet Take 5 mg by mouth daily. Takes 1/2 tab (5 mg total ) per day      . oxyCODONE (OXY IR/ROXICODONE) 5 MG immediate release tablet Take 5 mg by mouth every 6 (six) hours as needed.       [name] DISCONTD: lisinopril (PRINIVIL,ZESTRIL) 10 MG tablet Take 1 tablet (10 mg total) by mouth daily.  30 tablet  1  . furosemide (LASIX) 40 MG tablet Take 1 tablet (40 mg total) by mouth daily.  5 tablet  0  . metoprolol (LOPRESSOR) 50 MG tablet Take 1 tablet (50 mg total) by mouth 2 (two) times daily.  60 tablet  1  . nicotine (NICODERM CQ - DOSED IN MG/24 HOURS) 21 mg/24hr patch Place 1 patch onto the skin daily.  28 patch  0  . potassium chloride SA (K-DUR,KLOR-CON) 20 MEQ tablet Take 1 tablet (20 mEq total) by mouth daily.  5 tablet  0    No Known Allergies  Review of Systems no fever no drainage from incisions no swelling  BP 115/68  Pulse 83  Resp 18  Ht 5\' 10"  (1.778 m)  Wt 140 lb (63.504 kg)  BMI 20.09  kg/m2  SpO2 97% Physical Exam Alert and pleasant Neck bilateral carotid bruit right greater than left Thorax well-healed sternal incision lungs clear Cardiac regular rhythm without murmur or gallop Extremities no edema well-healed vein harvest site  Diagnostic Tests: Chest x-ray-clear lung fields, emphysema. No pleural effusion. Sternal wires intact.  Impression:  Stable one month after emergency CABG x4. The patient is wearing a life vest which has not discharged. The symptomatic floaters in the right I will be evaluated with a carotid Doppler exam which was not performed in this patient due to the emergency nature of his surgery. Plan: Return in 3 weeks to review results of carotid Dopplers. I stopped his Lasix and potassium since he has no no evidence of fluid retention.

## 2012-07-16 ENCOUNTER — Other Ambulatory Visit (HOSPITAL_COMMUNITY): Payer: BC Managed Care – PPO

## 2012-07-17 ENCOUNTER — Ambulatory Visit (HOSPITAL_COMMUNITY)
Admission: RE | Admit: 2012-07-17 | Discharge: 2012-07-17 | Disposition: A | Discharge status: Home / Self Care | Payer: BC Managed Care – PPO | Source: Ambulatory Visit | Attending: Cardiothoracic Surgery | Admitting: Cardiothoracic Surgery

## 2012-07-17 DIAGNOSIS — I2510 Atherosclerotic heart disease of native coronary artery without angina pectoris: Secondary | ICD-10-CM

## 2012-07-17 DIAGNOSIS — R0989 Other specified symptoms and signs involving the circulatory and respiratory systems: Secondary | ICD-10-CM

## 2012-07-17 DIAGNOSIS — I6529 Occlusion and stenosis of unspecified carotid artery: Secondary | ICD-10-CM | POA: Insufficient documentation

## 2012-07-17 NOTE — Progress Notes (Signed)
VASCULAR LAB PRELIMINARY  PRELIMINARY  PRELIMINARY  PRELIMINARY  Carotid Dopplers completed.    Preliminary report:  There is 40-59% right ICA stenosis, mid range of scale.  There is >80% left ICA stenosis.  Right vertebral flow is antegrade.  Left vertebral flow not insonated.  Left message for [facility] regarding the >80% ICA stenosis.  Jonathon Ryan, 07/17/2012, 2:00 PM

## 2012-07-22 ENCOUNTER — Other Ambulatory Visit: Payer: Self-pay

## 2012-07-22 DIAGNOSIS — I6529 Occlusion and stenosis of unspecified carotid artery: Secondary | ICD-10-CM

## 2012-07-23 ENCOUNTER — Other Ambulatory Visit (INDEPENDENT_AMBULATORY_CARE_PROVIDER_SITE_OTHER): Payer: BC Managed Care – PPO | Admitting: *Deleted

## 2012-07-23 ENCOUNTER — Ambulatory Visit (INDEPENDENT_AMBULATORY_CARE_PROVIDER_SITE_OTHER): Payer: BC Managed Care – PPO | Admitting: Vascular Surgery

## 2012-07-23 ENCOUNTER — Encounter: Payer: Self-pay | Admitting: Vascular Surgery

## 2012-07-23 VITALS — BP 138/67 | HR 68 | Ht 70.0 in | Wt 150.0 lb

## 2012-07-23 DIAGNOSIS — I6529 Occlusion and stenosis of unspecified carotid artery: Secondary | ICD-10-CM

## 2012-07-23 DIAGNOSIS — Z01810 Encounter for preprocedural cardiovascular examination: Secondary | ICD-10-CM

## 2012-07-23 NOTE — Progress Notes (Signed)
[facility] [place]  Patient name: Jonathon Ryan MRN: [id number] DOB: 06/10/1956 Sex: male  REASON FOR CONSULT: evaluate left carotid stenosis. Referred by Dr. Lovett Sox.  HPI: Jonathon Ryan is a 56 y.o. male who had a cardiac arrest at work in September of this year. He was defibrillated in the field and brought to the hospital where he underwent emergent coronary revascularization. He currently wears a life best. He has an ischemic cardiomyopathy with ejection fraction of 25 to 30%. He was recently seen in follow up by Dr. Maren Beach. He complained of some floaters and was sent for carotid evaluation. This had not been done preoperatively because of the emergency nature of his presentation. Carotid duplex scan at [facility] showed a greater than 80% left carotid stenosis and he was sent for vascular consultation.  He denies any previous history of stroke, TIAs, expressive or receptive aphasia, or amaurosis fugax.  Past Medical History  Diagnosis Date  . No pertinent past medical history   . CHF (congestive heart failure)   . Hyperlipidemia   please refer to the details of his recent admission in the history of present illness above.  Family History  Problem Relation Age of Onset  . Cancer Brother     SOCIAL HISTORY: History  Substance Use Topics  . Smoking status: Former Smoker -- 2.0 packs/day for 20 years    Types: Cigarettes    Start date: 06/11/2012  . Smokeless tobacco: Never Used  . Alcohol Use: 8.4 oz/week    14 Shots of liquor per week  he quit smoking September 4 of this year.  No Known Allergies  Current Outpatient Prescriptions  Medication Sig Dispense Refill  . aspirin EC 325 MG EC tablet Take 1 tablet (325 mg total) by mouth daily.      [name] atorvastatin (LIPITOR) 20 MG tablet Take 20 mg by mouth daily.      . carvedilol (COREG) 12.5 MG tablet Take 12.5 mg by mouth 2 (two) times daily with a meal.       . digoxin (LANOXIN) 0.25 MG tablet Take 1  tablet (0.25 mg total) by mouth daily.  30 tablet  1  . furosemide (LASIX) 40 MG tablet Take 1 tablet (40 mg total) by mouth daily.  5 tablet  0  . Lansoprazole (PREVACID PO) Take 1 capsule by mouth daily.      [name] lisinopril (PRINIVIL,ZESTRIL) 10 MG tablet Take 5 mg by mouth daily. Takes 1/2 tab (5 mg total ) per day      . metoprolol (LOPRESSOR) 50 MG tablet Take 1 tablet (50 mg total) by mouth 2 (two) times daily.  60 tablet  1  . oxyCODONE (OXY IR/ROXICODONE) 5 MG immediate release tablet Take 5 mg by mouth every 6 (six) hours as needed.       . potassium chloride SA (K-DUR,KLOR-CON) 20 MEQ tablet Take 1 tablet (20 mEq total) by mouth daily.  5 tablet  0    REVIEW OF SYSTEMS: Arly.[email or web address] ] denotes positive finding; [  ] denotes negative finding  CARDIOVASCULAR:  [ ]  chest pain   [ ]  chest pressure   [ ]  palpitations   [ ]  orthopnea   Arly.Keller ] dyspnea on exertion   [ ]  claudication   [ ]  rest pain   [ ]  DVT   [ ]  phlebitis PULMONARY:   [ ]  productive cough   [ ]  asthma   [ ]  wheezing NEUROLOGIC:   [ ]   weakness  [ ]  paresthesias  [ ]  aphasia  [ ]  amaurosis  Arly.Keller ] dizziness HEMATOLOGIC:   [ ]  bleeding problems   [ ]  clotting disorders MUSCULOSKELETAL:  [ ]  joint pain   [ ]  joint swelling [ ]  leg swelling GASTROINTESTINAL: [ ]   blood in stool  [ ]   hematemesis GENITOURINARY:  [ ]   dysuria  [ ]   hematuria PSYCHIATRIC:  [ ]  history of major depression INTEGUMENTARY:  [ ]  rashes  [ ]  ulcers CONSTITUTIONAL:  [ ]  fever   [ ]  chills  PHYSICAL EXAM: Filed Vitals:   07/23/12 1411 07/23/12 1413  BP: 137/71 138/67  Pulse: 68   Height: 5\' 10"  (1.778 m)   Weight: 150 lb (68.04 kg)   SpO2: 98%    Body mass index is 21.52 kg/(m^2). GENERAL: The patient is a well-nourished male, in no acute distress. The vital signs are documented above. CARDIOVASCULAR: There is a regular rate and rhythm. He has bilateral carotid bruits. He has palpable femoral pulses. Both feet are warm and well-perfused. PULMONARY: There is  good air exchange bilaterally without wheezing or rales. ABDOMEN: Soft and non-tender with normal pitched bowel sounds. I do not palpate an abdominal aortic aneurysm. MUSCULOSKELETAL: There are no major deformities or cyanosis. NEUROLOGIC: No focal weakness or paresthesias are detected. SKIN: There are no ulcers or rashes noted. PSYCHIATRIC: The patient has a normal affect.  DATA:  I reviewed his carotid duplex from [facility] which shows a greater than 80% left carotid stenosis. Is no significant stenosis on the right. Both vertebral arteries are patent with normally directed flow.  I reviewed his hospital records from September. He did amazingly well after his emergent coronary bypass surgery.  MEDICAL ISSUES:  Occlusion and stenosis of carotid artery without mention of cerebral infarction Is patient has a greater than 80% left carotid stenosis which is most likely asymptomatic. He has had some floaters but this has been in the right eye. Regardless, given the severity of the stenosis I think consideration should be given to a left carotid endarterectomy or carotid stenting. Given the patient's cardiac history including his cardiac arrest in September at work followed by emergent coronary revascularization and an ischemic cardiomyopathy with an ejection fraction of 25-30%, I do think he would be at significant increased risk for cardiac complications with surgery. I discussed the case with Dr. Nanetta Batty over the phone today and he is in agreement with that. He will arrange to see the patient in his office to potentially discuss carotid stenting. In the meantime, the patient knows to continue taking his aspirin. In addition he has quit smoking since this event in September. Of note he has no significant carotid stenosis on the right side. In addition both vertebral arteries are patent with normally directed flow.   Avina Eberle S [facility] [place] [facility]:  [phone]

## 2012-07-23 NOTE — Assessment & Plan Note (Signed)
Is patient has a greater than 80% left carotid stenosis which is most likely asymptomatic. He has had some floaters but this has been in the right eye. Regardless, given the severity of the stenosis I think consideration should be given to a left carotid endarterectomy or carotid stenting. Given the patient's cardiac history including his cardiac arrest in September at work followed by emergent coronary revascularization and an ischemic cardiomyopathy with an ejection fraction of 25-30%, I do think he would be at significant increased risk for cardiac complications with surgery. I discussed the case with Dr. Nanetta Batty over the phone today and he is in agreement with that. He will arrange to see the patient in his office to potentially discuss carotid stenting. In the meantime, the patient knows to continue taking his aspirin. In addition he has quit smoking since this event in September. Of note he has no significant carotid stenosis on the right side. In addition both vertebral arteries are patent with normally directed flow.

## 2012-07-30 ENCOUNTER — Encounter: Payer: Self-pay | Admitting: Cardiothoracic Surgery

## 2012-07-30 ENCOUNTER — Ambulatory Visit (INDEPENDENT_AMBULATORY_CARE_PROVIDER_SITE_OTHER): Payer: Self-pay | Admitting: Cardiothoracic Surgery

## 2012-07-30 VITALS — BP 124/71 | HR 67 | Resp 18 | Ht 70.0 in | Wt 150.0 lb

## 2012-07-30 DIAGNOSIS — I2510 Atherosclerotic heart disease of native coronary artery without angina pectoris: Secondary | ICD-10-CM

## 2012-07-30 DIAGNOSIS — Z951 Presence of aortocoronary bypass graft: Secondary | ICD-10-CM

## 2012-07-30 DIAGNOSIS — I2589 Other forms of chronic ischemic heart disease: Secondary | ICD-10-CM

## 2012-07-30 DIAGNOSIS — I255 Ischemic cardiomyopathy: Secondary | ICD-10-CM

## 2012-07-30 NOTE — Progress Notes (Signed)
PCP is Josue Hector, MD Referring Provider is Runell Gess, MD  Chief Complaint  Patient presents with  . Routine Post Op    3 week f/u review carotid dopplers, S/P CABG x 4 on 06/11/12    HPI: 56 year old Caucasian male returns for final postop visit after emergency CABG x4 for acute MI with V. fib arrest. Postop EF is 25-30% and he wears a life best which has not discharged. Clinically he is doing well walking without angina or CHF symptoms. He does have insomnia. But not orthopnea. Visual symptoms post discharge from the hospital revaluated with carotid Dopplers showing a left carotid high-grade stenosis and a possible left carotid stent is being planned by Dr. [name]. Surgical incisions are all well-healed.   Past Medical History  Diagnosis Date  . No pertinent past medical history   . CHF (congestive heart failure)   . Hyperlipidemia     Past Surgical History  Procedure Date  . No past surgeries   . Coronary artery bypass graft 06/11/2012    Procedure: CORONARY ARTERY BYPASS GRAFTING (CABG);  Surgeon: [name], MD;  Location: [facility] OR;  Service: Open Heart Surgery;  Laterality: [name];  Coronary Artery Bypass Grafting times four using left internal mammary artery and right greater saphenous vein endoscopically harvested.    Family History  Problem Relation Age of Onset  . Cancer Brother     Social History History  Substance Use Topics  . Smoking status: Former Smoker -- 2.0 packs/day for 20 years    Types: Cigarettes    Start date: 06/11/2012  . Smokeless tobacco: Never Used  . Alcohol Use: 8.4 oz/week    14 Shots of liquor per week    Current Outpatient Prescriptions  Medication Sig Dispense Refill  . aspirin EC 325 MG EC tablet Take 1 tablet (325 mg total) by mouth daily.      [name] atorvastatin (LIPITOR) 20 MG tablet Take 20 mg by mouth daily.      . carvedilol (COREG) 25 MG tablet Take 25 mg by mouth 2 (two) times daily. Takes 2 tablets po BID      .  digoxin (LANOXIN) 0.25 MG tablet Take 1 tablet (0.25 mg total) by mouth daily.  30 tablet  1  . Lansoprazole (PREVACID PO) Take 1 capsule by mouth daily.      [name] lisinopril (PRINIVIL,ZESTRIL) 10 MG tablet Take 5 mg by mouth daily. Takes 1/2 tab (5 mg total ) per day      . metoprolol (LOPRESSOR) 50 MG tablet Take 1 tablet (50 mg total) by mouth 2 (two) times daily.  60 tablet  1  . oxyCODONE (OXY IR/ROXICODONE) 5 MG immediate release tablet Take 5 mg by mouth every 6 (six) hours as needed.         No Known Allergies  Review of Systems no angina no orthopnea or PND no ankle edema surgical incisions without drainage still is some chest wall soreness from musculoskeletal etiology  BP 124/71  Pulse 67  Resp 18  Ht 5\' 10"  (1.778 m)  Wt 150 lb (68.04 kg)  BMI 21.52 kg/m2  SpO2 96% Physical Exam Alert and pleasant comfortable at rest HEENT normocephalic neck without JVD Lungs clear Sternal incision well-healed Cardiac rhythm regular without murmur Leg incision is healed no pedal edema  Diagnostic Tests: None today  Impression: 2 months postop clinically doing well at pacing a left carotid stent procedure for ICA stenosis. She will start cardiac rehabilitation after the  carotid procedure is completed. He knows he should not lift more than 20 pounds until December 1. The patient was given some Vicodin for his muscle scales chest pain and some Restoril for his insomnia. He will be followed by his cardiologist and return here when necessary he knows the importance of smoking cessation regular exercise in a heart healthy diet.  Plan:

## 2012-08-19 ENCOUNTER — Other Ambulatory Visit: Payer: Self-pay | Admitting: Cardiovascular Disease

## 2012-08-21 ENCOUNTER — Other Ambulatory Visit (HOSPITAL_COMMUNITY): Payer: Self-pay

## 2012-08-21 ENCOUNTER — Other Ambulatory Visit (HOSPITAL_COMMUNITY): Payer: Self-pay | Admitting: Cardiovascular Disease

## 2012-08-21 DIAGNOSIS — I10 Essential (primary) hypertension: Secondary | ICD-10-CM

## 2012-08-21 DIAGNOSIS — I2510 Atherosclerotic heart disease of native coronary artery without angina pectoris: Secondary | ICD-10-CM

## 2012-09-03 ENCOUNTER — Other Ambulatory Visit (HOSPITAL_COMMUNITY): Payer: Self-pay | Admitting: Cardiovascular Disease

## 2012-09-03 DIAGNOSIS — I739 Peripheral vascular disease, unspecified: Secondary | ICD-10-CM

## 2012-09-05 ENCOUNTER — Encounter (HOSPITAL_COMMUNITY): Payer: Self-pay | Admitting: Pharmacy Technician

## 2012-09-12 ENCOUNTER — Ambulatory Visit (HOSPITAL_COMMUNITY)
Admission: RE | Admit: 2012-09-12 | Discharge: 2012-09-12 | Disposition: A | Discharge status: Home / Self Care | Payer: BC Managed Care – PPO | Source: Ambulatory Visit | Attending: Cardiovascular Disease | Admitting: Cardiovascular Disease

## 2012-09-12 ENCOUNTER — Other Ambulatory Visit: Payer: Self-pay | Admitting: Cardiothoracic Surgery

## 2012-09-12 DIAGNOSIS — I10 Essential (primary) hypertension: Secondary | ICD-10-CM

## 2012-09-12 DIAGNOSIS — I2510 Atherosclerotic heart disease of native coronary artery without angina pectoris: Secondary | ICD-10-CM

## 2012-09-12 DIAGNOSIS — I739 Peripheral vascular disease, unspecified: Secondary | ICD-10-CM | POA: Insufficient documentation

## 2012-09-12 DIAGNOSIS — E785 Hyperlipidemia, unspecified: Secondary | ICD-10-CM | POA: Insufficient documentation

## 2012-09-12 DIAGNOSIS — I517 Cardiomegaly: Secondary | ICD-10-CM | POA: Insufficient documentation

## 2012-09-12 DIAGNOSIS — Z87891 Personal history of nicotine dependence: Secondary | ICD-10-CM | POA: Insufficient documentation

## 2012-09-12 MED ORDER — TECHNETIUM TC 99M SESTAMIBI GENERIC - CARDIOLITE
30.7000 | Freq: Once | INTRAVENOUS | Status: AC | PRN
  Administered 2012-09-12: 30.7 via INTRAVENOUS

## 2012-09-12 MED ORDER — REGADENOSON 0.4 MG/5ML IV SOLN
0.4000 mg | Freq: Once | INTRAVENOUS | Status: AC
  Administered 2012-09-12: 0.4 mg via INTRAVENOUS

## 2012-09-12 MED ORDER — TECHNETIUM TC 99M SESTAMIBI GENERIC - CARDIOLITE
11.0000 | Freq: Once | INTRAVENOUS | Status: AC | PRN
  Administered 2012-09-12: 11 via INTRAVENOUS

## 2012-09-12 MED ORDER — AMINOPHYLLINE 25 MG/ML IV SOLN
75.0000 mg | Freq: Once | INTRAVENOUS | Status: AC
  Administered 2012-09-12: 75 mg via INTRAVENOUS

## 2012-09-12 NOTE — Procedures (Signed)
[facility] [address] [place] [address] [place] [address] [phone]  Cardiology Nuclear Med Study  [name] is a 56 y.o. male     MRN : [id number]     DOB: 1956-09-28  Procedure Date: 09/12/2012  Nuclear Med Background Indication for Stress Test:  Evaluation for Ischemia, Surgical Clearance- Carotid Stent and Graft Patency History:  9/13 CABG x 4, MI, Life Vest Cardiac Risk Factors: Hypertension, LBBB and Smoker  Symptoms:  SOB   Nuclear Pre-Procedure Caffeine/Decaff Intake:  None NPO After: 6:00am   IV Site: R Forearm  IV 0.9% NS with Angio Cath:  22g  Chest Size (in):  42 IV Started by: Bonnita Levan, RN  Height: 5\' 10"  (1.778 m)  Cup Size: n/a  BMI:  Body mass index is 21.67 kg/(m^2). Weight:  151 lb (68.493 kg)   Tech Comments:  [name]    Nuclear Med Study 1 or 2 day study: 1 day  Stress Test Type:  Lexiscan  Order Authorizing Provider:  Nanetta Batty, MD   Resting Radionuclide: Technetium 76m Sestamibi  Resting Radionuclide Dose: 11.0 mCi   Stress Radionuclide:  Technetium 62m Sestamibi  Stress Radionuclide Dose: 30.7 mCi           Stress Protocol Rest HR: 57 Stress HR: 101  Rest BP: 143/90 Stress BP: 157/91  Exercise Time (min): n/a METS: n/a          Dose of Adenosine (mg):  n/a Dose of Lexiscan: 0.4 mg  Dose of Atropine (mg): n/a Dose of Dobutamine: n/a mcg/kg/min (at max HR)  Stress Test Technologist: Ernestene Mention, CCT Nuclear Technologist: Koren Shiver, CNMT   Rest Procedure:  Myocardial perfusion imaging was performed at rest 45 minutes following the intravenous administration of Technetium 66m Sestamibi. Stress Procedure:  The patient received IV Lexiscan 0.4 mg over 15-seconds.  Technetium 41m Sestamibi injected at 30-seconds.  There were no significant changes with Lexiscan.  Quantitative spect images were obtained after a 45 minute delay.Patient was given 75 mg aminophylline IV 45 minutes post  lexiscan injection for sustained headache.  Transient Ischemic Dilatation (Normal <1.22):  1.12  Lung/Heart Ratio (Normal <0.45):  0.28 QGS EDV:  217 ml QGS ESV:  154 ml LV Ejection Fraction: 59%

## 2012-09-12 NOTE — Progress Notes (Signed)
BLE Arterial Duplex Completed. No hemodynamically significant stenosis noted. Jonathon Ryan

## 2012-09-12 NOTE — Procedures (Signed)
Related encounter: MYOCARDIAL PERFUSION from 09/12/2012 in [facility]    Orders: Myocardial Perfusion Imaging [[id number]] at 09/12/2012 9:27 AM    [facility] [address]  [address]  [place] [address]  [phone]  Cardiology Nuclear Med Study  Jonathon Ryan is a 56 y.o. male MRN : [id number] DOB: 1956-10-01  Procedure Date: 09/12/2012  Nuclear Med Background  Indication for Stress Test: Evaluation for Ischemia, Surgical Clearance- Carotid Stent and Graft Patency  History: 9/13 CABG x 4, MI, Life Vest  Cardiac Risk Factors: Hypertension, LBBB and Smoker  Symptoms: SOB  Nuclear Pre-Procedure    Caffeine/Decaff Intake: None  NPO After: 6:00am    IV Site: R Forearm  IV 0.9% NS with Angio Cath: 22g    Chest Size (in): 42  IV Started by: Bonnita Levan, RN    Height: 5\' 10"  (1.778 m)  Cup Size: n/a    BMI: Body mass index is 21.67 kg/(m^2).  Weight: 151 lb (68.493 kg)     Tech Comments: [name]    Nuclear Med Study    1 or 2 day study: 1 day  Stress Test Type: Lexiscan    Order Authorizing Provider: Nanetta Batty, MD     Resting Radionuclide: Technetium 26m Sestamibi  Resting Radionuclide Dose: 11.0 mCi    Stress Radionuclide: Technetium 66m Sestamibi  Stress Radionuclide Dose: 30.7 mCi    Stress Protocol    Rest HR: 57  Stress HR: 101    Rest BP: 143/90  Stress BP: 157/91    Exercise Time (min): n/a  METS: n/a         Dose of Adenosine (mg): n/a  Dose of Lexiscan: 0.4 mg    Dose of Atropine (mg): n/a  Dose of Dobutamine: n/a mcg/kg/min (at max HR)    Stress Test Technologist: Ernestene Mention, CCT  Nuclear Technologist: Koren Shiver, CNMT    Rest Procedure: Myocardial perfusion imaging was performed at rest 45 minutes following the intravenous administration of Technetium 37m Sestamibi.  Stress Procedure: The patient received IV Lexiscan 0.4 mg over 15-seconds. Technetium 32m Sestamibi injected at  30-seconds. There were no significant changes with Lexiscan. Quantitative spect images were obtained after a 45 minute delay.Patient was given 75 mg aminophylline IV 45 minutes post lexiscan injection for sustained headache.  Transient Ischemic Dilatation (Normal <1.22): 1.12  Lung/Heart Ratio (Normal <0.45): 0.28  QGS EDV: 217 ml  QGS ESV: 154 ml  LV Ejection Fraction: 59%    .   Rest ECG: NSR-LBBB  Stress ECG: No significant change from baseline ECG  QPS Raw Data Images:  Mildly dilated left ventricle Stress Images:  There is a large perfusion defect with severely decreased uptake in the entire inferior, inferolateral and inferoseptal walls. Rest Images:  Comparison with the stress images reveals no significant change. Subtraction (SDS):  There is a fixed defect that is most consistent with a previous infarction. LV Wall Motion:  Akinetic inferior, inferolateral and inferoseptal walls. Severely depressed overall systolic function with a calculated EF of 29%.  Impression Exercise Capacity:  Lexiscan with no exercise. BP Response:  Normal blood pressure response. Clinical Symptoms:  There is dyspnea. ECG Impression:  Baseline:  LBBB.  EKG uninterpretable due to LBBB at rest and stress. Comparison with Prior Nuclear Study: No previous nuclear study performed  Overall Impression:  Low risk stress nuclear study. Large scar in the right coronary artery distribution. No reversible ischemia. Severely  depressed overall left ventricular systolic function.     [name], MD  09/12/2012 2:16 PM

## 2012-09-12 NOTE — Progress Notes (Signed)
Renal Duplex Completed. RRA 60-99% stenosis. Jonathon Ryan

## 2012-09-12 NOTE — Progress Notes (Signed)
[facility]   2D echo completed 09/12/2012.   Cindy Eron Staat, RDCS   

## 2012-09-22 ENCOUNTER — Ambulatory Visit (HOSPITAL_COMMUNITY)
Admission: RE | Admit: 2012-09-22 | Discharge: 2012-09-22 | Disposition: A | Discharge status: Home / Self Care | Payer: BC Managed Care – PPO | Source: Ambulatory Visit | Attending: Cardiovascular Disease | Admitting: Cardiovascular Disease

## 2012-09-22 DIAGNOSIS — I739 Peripheral vascular disease, unspecified: Secondary | ICD-10-CM | POA: Insufficient documentation

## 2012-09-22 NOTE — Progress Notes (Signed)
Carotid duplex completed 

## 2012-09-23 DIAGNOSIS — Z0271 Encounter for disability determination: Secondary | ICD-10-CM

## 2012-09-25 ENCOUNTER — Encounter (HOSPITAL_COMMUNITY): Payer: BC Managed Care – PPO

## 2012-10-14 ENCOUNTER — Inpatient Hospital Stay (HOSPITAL_COMMUNITY)
Admission: RE | Admit: 2012-10-14 | Discharge: 2012-10-16 | DRG: 839 | Disposition: A | Discharge status: Home / Self Care | Payer: BC Managed Care – PPO | Source: Ambulatory Visit | Attending: Cardiovascular Disease | Admitting: Cardiovascular Disease

## 2012-10-14 ENCOUNTER — Encounter (HOSPITAL_COMMUNITY)
Admission: RE | Disposition: A | Discharge status: Home / Self Care | Payer: Self-pay | Source: Ambulatory Visit | Attending: Cardiovascular Disease

## 2012-10-14 ENCOUNTER — Encounter (HOSPITAL_COMMUNITY): Payer: Self-pay | Admitting: *Deleted

## 2012-10-14 DIAGNOSIS — Z8674 Personal history of sudden cardiac arrest: Secondary | ICD-10-CM

## 2012-10-14 DIAGNOSIS — E785 Hyperlipidemia, unspecified: Secondary | ICD-10-CM | POA: Diagnosis present

## 2012-10-14 DIAGNOSIS — I708 Atherosclerosis of other arteries: Secondary | ICD-10-CM | POA: Diagnosis present

## 2012-10-14 DIAGNOSIS — Y849 Medical procedure, unspecified as the cause of abnormal reaction of the patient, or of later complication, without mention of misadventure at the time of the procedure: Secondary | ICD-10-CM | POA: Diagnosis not present

## 2012-10-14 DIAGNOSIS — Y9210 Unspecified residential institution as the place of occurrence of the external cause: Secondary | ICD-10-CM | POA: Diagnosis not present

## 2012-10-14 DIAGNOSIS — I9589 Other hypotension: Secondary | ICD-10-CM | POA: Diagnosis not present

## 2012-10-14 DIAGNOSIS — I255 Ischemic cardiomyopathy: Secondary | ICD-10-CM | POA: Diagnosis present

## 2012-10-14 DIAGNOSIS — Z951 Presence of aortocoronary bypass graft: Secondary | ICD-10-CM

## 2012-10-14 DIAGNOSIS — Z95820 Peripheral vascular angioplasty status with implants and grafts: Secondary | ICD-10-CM

## 2012-10-14 DIAGNOSIS — I2510 Atherosclerotic heart disease of native coronary artery without angina pectoris: Secondary | ICD-10-CM | POA: Diagnosis present

## 2012-10-14 DIAGNOSIS — Z720 Tobacco use: Secondary | ICD-10-CM | POA: Diagnosis present

## 2012-10-14 DIAGNOSIS — I739 Peripheral vascular disease, unspecified: Secondary | ICD-10-CM | POA: Diagnosis present

## 2012-10-14 DIAGNOSIS — I447 Left bundle-branch block, unspecified: Secondary | ICD-10-CM | POA: Diagnosis present

## 2012-10-14 DIAGNOSIS — I701 Atherosclerosis of renal artery: Secondary | ICD-10-CM | POA: Diagnosis present

## 2012-10-14 DIAGNOSIS — I959 Hypotension, unspecified: Secondary | ICD-10-CM | POA: Diagnosis not present

## 2012-10-14 DIAGNOSIS — I2589 Other forms of chronic ischemic heart disease: Secondary | ICD-10-CM | POA: Diagnosis present

## 2012-10-14 DIAGNOSIS — F17200 Nicotine dependence, unspecified, uncomplicated: Secondary | ICD-10-CM | POA: Diagnosis present

## 2012-10-14 DIAGNOSIS — I6529 Occlusion and stenosis of unspecified carotid artery: Secondary | ICD-10-CM | POA: Diagnosis present

## 2012-10-14 HISTORY — PX: CAROTID ANGIOGRAM: SHX5504

## 2012-10-14 HISTORY — PX: CAROTID STENT INSERTION: SHX5505

## 2012-10-14 LAB — TROPONIN I
Troponin I: <0.3 ng/mL (ref <0.30)
Troponin I: <0.3 ng/mL (ref <0.30)

## 2012-10-14 SURGERY — CAROTID STENT INSERTION
Anesthesia: LOCAL

## 2012-10-14 MED ORDER — HEPARIN (PORCINE) IN NACL 2-0.9 UNIT/ML-% IJ SOLN
INTRAMUSCULAR | Status: AC
  Filled 2012-10-14: qty 1000

## 2012-10-14 MED ORDER — DOPAMINE-DEXTROSE 3.2-5 MG/ML-% IV SOLN
INTRAVENOUS | Status: AC
  Filled 2012-10-14: qty 250

## 2012-10-14 MED ORDER — CLOPIDOGREL BISULFATE 75 MG PO TABS
75.0000 mg | ORAL_TABLET | Freq: Every day | ORAL | Status: DC
  Administered 2012-10-15 – 2012-10-16 (×2): 75 mg via ORAL
  Filled 2012-10-14 (×3): qty 1

## 2012-10-14 MED ORDER — BIVALIRUDIN 250 MG IV SOLR
INTRAVENOUS | Status: AC
  Filled 2012-10-14: qty 250

## 2012-10-14 MED ORDER — LIDOCAINE HCL (PF) 1 % IJ SOLN
INTRAMUSCULAR | Status: AC
  Filled 2012-10-14: qty 30

## 2012-10-14 MED ORDER — CARVEDILOL 25 MG PO TABS
25.0000 mg | ORAL_TABLET | Freq: Two times a day (BID) | ORAL | Status: DC
  Administered 2012-10-14: 25 mg via ORAL
  Filled 2012-10-14 (×4): qty 1

## 2012-10-14 MED ORDER — ACETAMINOPHEN 325 MG PO TABS
650.0000 mg | ORAL_TABLET | ORAL | Status: DC | PRN
  Administered 2012-10-14 – 2012-10-16 (×3): 650 mg via ORAL
  Filled 2012-10-14 (×3): qty 2

## 2012-10-14 MED ORDER — ATROPINE SULFATE 1 MG/ML IJ SOLN
INTRAMUSCULAR | Status: AC
  Filled 2012-10-14: qty 1

## 2012-10-14 MED ORDER — HYDRALAZINE HCL 20 MG/ML IJ SOLN: 10.0000 mg | INTRAMUSCULAR | Status: DC

## 2012-10-14 MED ORDER — SODIUM CHLORIDE 0.9 % IV SOLN
INTRAVENOUS | Status: AC
  Administered 2012-10-14: 75 mL via INTRAVENOUS
  Administered 2012-10-14: 17:00:00 via INTRAVENOUS

## 2012-10-14 MED ORDER — SODIUM CHLORIDE 0.9 % IV SOLN
INTRAVENOUS | Status: DC
  Administered 2012-10-14: 06:00:00 via INTRAVENOUS

## 2012-10-14 MED ORDER — ASPIRIN EC 325 MG PO TBEC
325.0000 mg | DELAYED_RELEASE_TABLET | Freq: Every day | ORAL | Status: DC
  Administered 2012-10-15 – 2012-10-16 (×2): 325 mg via ORAL
  Filled 2012-10-14 (×2): qty 1

## 2012-10-14 MED ORDER — DOPAMINE-DEXTROSE 3.2-5 MG/ML-% IV SOLN
2.0000 ug/kg/min | INTRAVENOUS | Status: DC
  Administered 2012-10-14: 2 ug/kg/min via INTRAVENOUS

## 2012-10-14 MED ORDER — MORPHINE SULFATE 2 MG/ML IJ SOLN: 1.0000 mg | INTRAMUSCULAR | Status: DC | PRN

## 2012-10-14 MED ORDER — SODIUM CHLORIDE 0.9 % IJ SOLN: 3.0000 mL | INTRAMUSCULAR | Status: DC | PRN

## 2012-10-14 MED ORDER — SODIUM CHLORIDE 0.9 % IV SOLN: INTRAVENOUS | Status: AC

## 2012-10-14 MED ORDER — ONDANSETRON HCL 4 MG/2ML IJ SOLN
4.0000 mg | Freq: Four times a day (QID) | INTRAMUSCULAR | Status: DC | PRN
  Administered 2012-10-14 – 2012-10-15 (×2): 4 mg via INTRAVENOUS
  Filled 2012-10-14 (×2): qty 2

## 2012-10-14 MED ORDER — DIVALPROEX SODIUM ER 500 MG PO TB24
500.0000 mg | ORAL_TABLET | Freq: Every day | ORAL | Status: DC
  Administered 2012-10-14 – 2012-10-15 (×2): 500 mg via ORAL
  Filled 2012-10-14 (×3): qty 1

## 2012-10-14 MED ORDER — NOREPINEPHRINE BITARTRATE 1 MG/ML IJ SOLN
INTRAMUSCULAR | Status: AC
  Filled 2012-10-14: qty 4

## 2012-10-14 MED ORDER — ATORVASTATIN CALCIUM 20 MG PO TABS
20.0000 mg | ORAL_TABLET | Freq: Every day | ORAL | Status: DC
  Administered 2012-10-14 – 2012-10-15 (×2): 20 mg via ORAL
  Filled 2012-10-14 (×3): qty 1

## 2012-10-14 MED ORDER — LISINOPRIL 10 MG PO TABS
10.0000 mg | ORAL_TABLET | Freq: Every day | ORAL | Status: DC
  Administered 2012-10-14: 10 mg via ORAL
  Filled 2012-10-14 (×3): qty 1

## 2012-10-14 MED ORDER — PANTOPRAZOLE SODIUM 40 MG PO TBEC
40.0000 mg | DELAYED_RELEASE_TABLET | Freq: Every day | ORAL | Status: DC
  Administered 2012-10-14 – 2012-10-16 (×3): 40 mg via ORAL
  Filled 2012-10-14 (×5): qty 1

## 2012-10-14 NOTE — Op Note (Signed)
[name] is a 57 y.o. male    [id number] LOCATION:  FACILITY: [facility]  PHYSICIAN: Nanetta Batty, M.D. [birth year]   DATE OF PROCEDURE:  10/14/2012  DATE OF DISCHARGE:  [facility]  PV Intervention    History obtained from chart review. Jonathon Ryan is a 57 year old thin appearing married Caucasian male in no children who has a history of a witnessed cardiac arrest bystander CPR defibrillation, artic sun, who presented for emergent cardiac catheterization by myself revealing left main/three-vessel disease. Underwent emergency coronary artery bypass grafting by Dr. Kathlee Nations Trigt with a LIMA to his LAD, a vein to PDA, sequentially into OM 1 and OM 2. His ejection fraction has remained in the 25-30% range. He made a full neurologic recovery. Carotid Dopplers revealed a high-grade left internal carotid artery stenosis. He was seen by neurology who recommended carotid artery stenting because he was high risk for endarterectomy. He presents today after being on aspirin/ Plavix cerebral angiography and carotid artery stenting under the "sapphire" protocol.   PROCEDURE DESCRIPTION:    The patient was brought to the second floor [facility] cath lab in the postabsorptive state. He was not Premedicated.[name] His left groinwas prepped and shaved in usual sterile fashion. Xylocaine 1% was used for local anesthesia. A 6 French sheath was inserted into the left common femoral  artery using standard Seldinger technique. The patient received Angiomax bolus with an ACT of 426 .  A 6 French pigtail catheter was used for aortic arch angiography and a 6 [place] JP 1 catheter and Berenstein catheter were used for cerebral angiography looking at intra-and extracranial views. Visipaque dye was used for the entirety of the case. Retrograde Aortic pressure was monitored during the case.    HEMODYNAMICS:    AO SYSTOLIC/AO DIASTOLIC: 172/90  ANGIOGRAPHIC RESULTS:   1: Aortic arch  angiogram-type one arch  2: Right carotid angiogram-percent ostial right internal carotid artery stenosis. Carotid artery artery supplied the left anterior cerebral circulation.  3: Left carotid angiogram-the 95% ostial left internal carotid artery stenosis as well as left external carotid artery stenosis . The anterior circulation on the left was supplied by the right carotid artery.   IMPRESSION:[name]  has a high-grade proximal left internal carotid artery stenosis suitable for percutaneous revascularization. We'll proceed with carotid artery stenting under the "Sapphire" protocol. Dr. Durene Cal will assist during the case.   Procedure description: The short 6 French sheath was exchanged for a long 6 [place] sheath. Access to the left common carotid artery was obtained with a JB 1 catheter. H846 Amplatz wire was used to advance in the sheath into the left common carotid artery. A 5 [place] Angioguard distal protection device was advanced across the lesion and the lesion was predilated with a 2.5 x 20 mm long balloon. Following this a 7 mm x 30 mm long precise Nitinol self-expanding stent was then carefully positioned across the lesion and deployed. The patient received a milligram of atropine prophylactically prior to post dilatation with a 4 mm x 20 mm long balloon resulting in reduction of a 95% ostial left internal carotid artery stenosis to 0% residual. The left external artery was lost angiographically during the case. Completion angiography revealed that the left carotid now supplied partially the left anterior circulation.  Final impression: Successful carotid artery stenting of a high-grade left internal carotid artery stenosis in a patient high risk for surgical revascularization under the "Sapphire protocol using distal protection and Angiomax. The  patient had been on aspirin and Plavix for over a week prior to the intervention. The intracranial anatomy repeat interpreted by neuro  interventional radiology. The long sheath was then exchanged for a short 6 French sheath in the patient left the Cath Lab in stable condition. He was neurologically intact which was assessed after the stent procedure. The patient will be hydrated overnight in the step down unit and discharged home in the morning. He left the Cath Lab neurologically  intact.  Runell Gess MD, [facility] 10/14/2012 9:25 AM

## 2012-10-14 NOTE — Research (Signed)
SAPPHIRE [employer] Informed Consent   Subject Name: Jonathon Ryan  Subject met inclusion and exclusion criteria.  The informed consent form, study requirements and expectations were reviewed with the subject and questions and concerns were addressed prior to the signing of the consent form.  The subject verbalized understanding of the trial requirements.  The subject agreed to participate in the [facility] trial and signed the informed consent.  The informed consent was obtained prior to performance of any protocol-specific procedures for the subject.  A copy of the signed informed consent was given to the subject and a copy was placed in the subject's medical record.  Brunilda Payor 10/14/2012, 8:26 AM

## 2012-10-14 NOTE — Progress Notes (Signed)
Pt has SBP 80s, MD notified; IVFs restarted x 6hrs. MD re-called due to pt stating that he felt weak. MD wants RN to continue with IVFs and monitor pt closely and to call if any changes occur.

## 2012-10-14 NOTE — Progress Notes (Signed)
Notified Md about pt's BP, and BP medication.  New orders received.  Will hold coreg and start Dopamine drip for low BP.  Pt's SBP 74. Will continue to monitor. Jonathon Ryan

## 2012-10-14 NOTE — H&P (Signed)
  H & P will be scanned in.  Pt was reexamined and existing H & P reviewed. No changes found.  Runell Gess, MD [facility] 10/14/2012 7:49 AM

## 2012-10-15 ENCOUNTER — Encounter (HOSPITAL_COMMUNITY): Payer: Self-pay | Admitting: Cardiology

## 2012-10-15 DIAGNOSIS — Z9889 Other specified postprocedural states: Secondary | ICD-10-CM | POA: Insufficient documentation

## 2012-10-15 DIAGNOSIS — I679 Cerebrovascular disease, unspecified: Secondary | ICD-10-CM

## 2012-10-15 DIAGNOSIS — Z95820 Peripheral vascular angioplasty status with implants and grafts: Secondary | ICD-10-CM

## 2012-10-15 DIAGNOSIS — I959 Hypotension, unspecified: Secondary | ICD-10-CM | POA: Diagnosis not present

## 2012-10-15 HISTORY — DX: Cerebrovascular disease, unspecified: I67.9

## 2012-10-15 LAB — CBC
HCT: 37.5 % — ABNORMAL LOW (ref 39.0–52.0)
MCH: 28.4 pg (ref 26.0–34.0)
MCV: 82.4 fL (ref 78.0–100.0)
Platelets: 254 10*3/uL (ref 150–400)
RBC: 4.55 MIL/uL (ref 4.22–5.81)
RDW: 12.6 % (ref 11.5–15.5)

## 2012-10-15 LAB — BASIC METABOLIC PANEL
Calcium: 9.8 mg/dL (ref 8.4–10.5)
GFR calc Af Amer: 54 mL/min — ABNORMAL LOW (ref >90)
GFR calc non Af Amer: 47 mL/min — ABNORMAL LOW (ref >90)
Potassium: 4.2 mEq/L (ref 3.5–5.1)
Sodium: 135 mEq/L (ref 135–145)

## 2012-10-15 MED ORDER — SODIUM CHLORIDE 0.9 % IV SOLN: INTRAVENOUS | Status: DC

## 2012-10-15 MED FILL — Dextrose Inj 5%: INTRAVENOUS | Qty: 50 | Status: AC

## 2012-10-15 NOTE — Progress Notes (Signed)
57 year old thin appearing married Caucasian male who has a history of a witnessed cardiac arrest bystander CPR defibrillation, artic sun, who presented for emergent cardiac catheterization by myself revealing left main/three-vessel disease. Underwent emergency coronary artery bypass grafting by Dr. Kathlee Nations Trigt with a LIMA to his LAD, a vein to PDA, sequentially into OM 1 and OM 2. His ejection fraction has remained in the 25-30% range. He made a full neurologic recovery. Carotid Dopplers revealed a high-grade left internal carotid artery stenosis. He was seen by neurology who recommended carotid artery stenting because he was high risk for endarterectomy. He presented yesterday after being on aspirin/ Plavix for cerebral angiography and carotid artery stenting under the "sapphire" protocol.   Subjective:  no complaints  Objective: Vital signs in last 24 hours: Temp:  [97.2 F (36.2 C)-98.4 F (36.9 C)] 98.1 F (36.7 C) (01/08 0755) Pulse Rate:  [53-71] 71  (01/08 0755) Resp:  [11-18] 16  (01/08 0800) BP: (64-123)/(36-70) 97/49 mmHg (01/08 0800) SpO2:  [96 %-100 %] 96 % (01/08 0755) Weight:  [74.2 kg (163 lb 9.3 oz)] 74.2 kg (163 lb 9.3 oz) (01/07 1220) Weight change: 0.264 kg (9.3 oz) Last BM Date: 10/14/12 Intake/Output from previous day: +1013 01/07 0701 - 01/08 0700 In: 1778.1 [P.O.:720; I.V.:1058.1] Out: 1050 [Urine:1050] Intake/Output this shift:    PE: General:per Dr. Herbie Baltimore General appearance: alert, cooperative and no distress Neck: no JVD and no notable L sided carotid bruit Lungs: clear to auscultation bilaterally, normal percussion bilaterally and non-labored Heart: regular rate and rhythm, S1, S2 normal, no murmur, click, rub or gallop Abdomen: soft, non-tender; bowel sounds normal; no masses,  no organomegaly Pulses: diminished pedal pulses bilaterally, but feet warm; groin site c/d/i Neurologic: Grossly normal   Lab Results:  Basename 10/15/12 0452  WBC  11.8*  HGB 12.9*  HCT 37.5*  PLT 254   BMET  Basename 10/15/12 0452  NA 135  K 4.2  CL 99  CO2 21  GLUCOSE 115*  BUN 26*  CREATININE 1.59*  CALCIUM 9.8    Basename 10/15/12 0452 10/14/12 2140  TROPONINI <0.30 <0.30     Lab Results  Component Value Date   HGBA1C 5.3 06/14/2012      Studies/Results: No results found.  PROCEDURES: 10/14/12  Successful carotid artery stenting of a high-grade left internal carotid artery stenosis in a patient high risk for surgical revascularization under the "Sapphire protocol using distal protection and Angiomax.  Medications: I have reviewed the patient's current medications.    [name] aspirin EC  325 mg Oral Daily  . atorvastatin  20 mg Oral q1800  . carvedilol  25 mg Oral BID  . clopidogrel  75 mg Oral Q breakfast  . divalproex  500 mg Oral Q1200  . hydrALAZINE  10 mg Intravenous UD  . lisinopril  10 mg Oral Daily  . pantoprazole  40 mg Oral Daily   Assessment/Plan: Principal Problem:  *Occlusion and stenosis of carotid artery without mention of cerebral infarction Active Problems:  Status post angioplasty with stent, of High grade Lt ICA 10/14/12  Hypotension- post carotid stent  Ischemic cardiomyopathy, EF 25-30% by echo 06/17/12  PVD, 90% Rt RAS, 80% Rt Iliac at cath  CAD, 90% LM, Total RCA, 80-90% CFX- S/P urgent CABG X 4  PLAN: Hypotensive during the night started on Dopamine. Holding BB.  Keep in step down today - Dr. Herbie Baltimore has seen.   LOS: 1 day   INGOLD,LAURA R 10/15/2012, 9:27 AM  I  suspect that hypotension was related to Carotid Stenting with pressure node disruption.    Will plan to wean off Dopamine today   Need to restart BB prior to d.c  Anticipate d/c in AM provided BP recovers.  Marykay Lex, M.D., M.S. [facility] [address] [place], [place]  [address]  [phone] Pager # [phone] 10/15/2012 3:50 PM

## 2012-10-16 DIAGNOSIS — I447 Left bundle-branch block, unspecified: Secondary | ICD-10-CM | POA: Diagnosis present

## 2012-10-16 LAB — CBC
MCHC: 33.1 g/dL (ref 30.0–36.0)
MCV: 82.9 fL (ref 78.0–100.0)
Platelets: 216 10*3/uL (ref 150–400)
RDW: 12.8 % (ref 11.5–15.5)
WBC: 6.9 10*3/uL (ref 4.0–10.5)

## 2012-10-16 LAB — BASIC METABOLIC PANEL
BUN: 18 mg/dL (ref 6–23)
CO2: 25 mEq/L (ref 19–32)
Calcium: 9.5 mg/dL (ref 8.4–10.5)
Creatinine, Ser: 1.06 mg/dL (ref 0.50–1.35)

## 2012-10-16 MED ORDER — CARVEDILOL 25 MG PO TABS: 12.5000 mg | ORAL_TABLET | Freq: Two times a day (BID) | ORAL | Status: DC

## 2012-10-16 MED ORDER — ACETAMINOPHEN 325 MG PO TABS: 650.0000 mg | ORAL_TABLET | ORAL | Status: DC | PRN

## 2012-10-16 NOTE — Progress Notes (Signed)
Subjective:  Felling much better. No CP/SOB  Objective:  Temp:  [97.7 F (36.5 C)-98.7 F (37.1 C)] 98 F (36.7 C) (01/09 0749) Pulse Rate:  [67-82] 79  (01/09 0400) Resp:  [16-18] 16  (01/09 0749) BP: (86-127)/(45-67) 118/59 mmHg (01/09 0749) SpO2:  [96 %-99 %] 98 % (01/09 0749) Weight change:   Intake/Output from previous day: 01/08 0701 - 01/09 0700 In: 937.1 [P.O.:800; I.V.:137.1] Out: 200 [Urine:200]  Intake/Output from this shift:    Physical Exam: General appearance: alert, cooperative and no distress Neck: no adenopathy, no carotid bruit, no JVD, supple, symmetrical, trachea midline and thyroid not enlarged, symmetric, no tenderness/mass/nodules Lungs: clear to auscultation bilaterally Heart: regular rate and rhythm, S1, S2 normal, no murmur, click, rub or gallop Extremities: extremities normal, atraumatic, no cyanosis or edema and Left groin OK  Lab Results: Results for orders placed during the hospital encounter of 10/14/12 (from the past 48 hour(s))  POCT ACTIVATED CLOTTING TIME     Status: Normal   Collection Time   10/14/12  8:31 AM      Component Value Range Comment   Activated Clotting Time 426     MRSA PCR SCREENING     Status: Normal   Collection Time   10/14/12 12:35 PM      Component Value Range Comment   MRSA by PCR NEGATIVE  NEGATIVE   TROPONIN I     Status: Normal   Collection Time   10/14/12  1:33 PM      Component Value Range Comment   Troponin I <0.30  <0.30 ng/mL   TROPONIN I     Status: Normal   Collection Time   10/14/12  9:40 PM      Component Value Range Comment   Troponin I <0.30  <0.30 ng/mL   TROPONIN I     Status: Normal   Collection Time   10/15/12  4:52 AM      Component Value Range Comment   Troponin I <0.30  <0.30 ng/mL   BASIC METABOLIC PANEL     Status: Abnormal   Collection Time   10/15/12  4:52 AM      Component Value Range Comment   Sodium 135  135 - 145 mEq/L    Potassium 4.2  3.5 - 5.1 mEq/L    Chloride 99  96 - 112 mEq/L     CO2 21  19 - 32 mEq/L    Glucose, Bld 115 (*) 70 - 99 mg/dL    BUN 26 (*) 6 - 23 mg/dL    Creatinine, Ser 1.61 (*) 0.50 - 1.35 mg/dL    Calcium 9.8  8.4 - 09.6 mg/dL    GFR calc non Af Amer 47 (*) >90 mL/min    GFR calc Af Amer 54 (*) >90 mL/min   CBC     Status: Abnormal   Collection Time   10/15/12  4:52 AM      Component Value Range Comment   WBC 11.8 (*) 4.0 - 10.5 K/uL    RBC 4.55  4.22 - 5.81 MIL/uL    Hemoglobin 12.9 (*) 13.0 - 17.0 g/dL    HCT 04.5 (*) 40.9 - 52.0 %    MCV 82.4  78.0 - 100.0 fL    MCH 28.4  26.0 - 34.0 pg    MCHC 34.4  30.0 - 36.0 g/dL    RDW 81.1  91.4 - 78.2 %    Platelets 254  150 - 400 K/uL  BASIC METABOLIC PANEL     Status: Abnormal   Collection Time   10/16/12  4:45 AM      Component Value Range Comment   Sodium 136  135 - 145 mEq/L    Potassium 4.3  3.5 - 5.1 mEq/L    Chloride 104  96 - 112 mEq/L    CO2 25  19 - 32 mEq/L    Glucose, Bld 95  70 - 99 mg/dL    BUN 18  6 - 23 mg/dL    Creatinine, Ser 7.82  0.50 - 1.35 mg/dL    Calcium 9.5  8.4 - 95.6 mg/dL    GFR calc non Af Amer 76 (*) >90 mL/min    GFR calc Af Amer 88 (*) >90 mL/min   CBC     Status: Abnormal   Collection Time   10/16/12  4:45 AM      Component Value Range Comment   WBC 6.9  4.0 - 10.5 K/uL    RBC 3.97 (*) 4.22 - 5.81 MIL/uL    Hemoglobin 10.9 (*) 13.0 - 17.0 g/dL    HCT 21.3 (*) 08.6 - 52.0 %    MCV 82.9  78.0 - 100.0 fL    MCH 27.5  26.0 - 34.0 pg    MCHC 33.1  30.0 - 36.0 g/dL    RDW 57.8  46.9 - 62.9 %    Platelets 216  150 - 400 K/uL     Imaging: Imaging results have been reviewed  Assessment/Plan:   1. Principal Problem: 2.  *Occlusion and stenosis of carotid artery without mention of cerebral infarction 3. Active Problems: 4.  Ischemic cardiomyopathy, EF 25-30% by echo 06/17/12 5.  PVD, 90% Rt RAS, 80% Rt Iliac at cath 6.  CAD, 90% LM, Total RCA, 80-90% CFX- S/P urgent CABG X 4 7.  Status post angioplasty with stent, of High grade Lt ICA 10/14/12 8.   Hypotension- post carotid stent 9.   Time Spent Directly with Patient:  25 minutes  Length of Stay:  LOS: 2 days   Looks great this AM. POD #2 Left CAS. Had issues with hypotension post procedure. Was on low dose dop briefly. VSS today. BB was help and can be restarted at lower dose (Coreg 12.5 mg PO BID). Exam benign. Left groin OK. Labs OK. SCr improved. OK for D/C home. OP Cartotid doppler, ROV with extender 1-2 weeks and with me 4-6 weeks.   Runell Gess 10/16/2012, 7:57 AM

## 2012-10-16 NOTE — Progress Notes (Signed)
Pt discharged home with his wife. Pt given discharge instructions and explanation on post hospital care. Pt verbalized understanding discharge paperwork, medications, and follow-up appointments. VS stable at the time of discharge. Pt left ambulating out of unit accompanied with RN.

## 2012-10-16 NOTE — Consult Note (Signed)
[name], Jonathon Ryan                 ACCOUNT NO.:  [id number]  MEDICAL RECORD NO.:  [id number]  LOCATION:                                 FACILITY:  PHYSICIAN:  Dailin Sosnowski K. Talicia Sui, M.D.DATE OF BIRTH:  [birth year]  DATE OF CONSULTATION: DATE OF DISCHARGE:                                CONSULTATION   CLINICAL HISTORY:  The patient with a left-sided carotid stenosis.  EXAMINATION:  Intracranial interpretation of bilateral common carotid arteriograms, and post stent placement  interpretation of the left common carotid artery injection.  FINDINGS:  The right common carotid arteriogram demonstrates the right internal carotid artery in its petrous, cavernous, and supraclinoid segments to be widely patent.  The right middle and right anterior cerebral arteries opacify normally into the capillary and the venous phases.  Prompt cross opacification via the anterior communicating artery of the left anterior cerebral artery and the left middle cerebral artery, to the cortical branches are seen from the right-sided carotid injection.  The left common carotid arteriogram demonstrates the left internal carotid artery in its petrous, cavernous, and supraclinoid segments were widely patent.  Flow is noted into the left middle cerebral artery distribution with inflow of unopacified blood from the right carotid artery via the anterior communicating artery as described above.  No angiographic opacification of the right anterior cerebral artery distribution is seen.  Post stent placement in the left common carotid bifurcation demonstrates significantly improved flow into the left internal carotid artery, petrous cavernous and supraclinoid segments.  Prompt opacification seen of the left middle and the left anterior cerebral arteries into the capillary and the venous phases.  There continues, however, to be cross filling from the right anterior cerebral artery via the anterior communicating  artery as seen by unopacified blood in the anterior cerebral artery distribution.  IMPRESSION: 1. Post stent placement in the left common carotid artery bifurcation     demonstrates no intracranial intraluminal filling defects. 2. Improved hemodynamic flow into the left anterior circulation post     stent placement.          ______________________________ Grandville Silos. Corliss Skains, M.D.     SKD/MEDQ  D:  10/15/2012  T:  10/16/2012  Job:  [id number]

## 2012-10-16 NOTE — Discharge Summary (Signed)
Patient ID: Jonathon Ryan,  MRN: [id number], DOB/AGE: [birth year] 57 y.o.  Admit date: 10/14/2012 Discharge date: 10/16/2012  Primary Care Provider: Primary Cardiologist: Dr [name]  Discharge Diagnoses  Principal Problem:  *Carotid artery stenosis, LICA stent 10/14/12  Active Problems:  Ischemic cardiomyopathy, EF 25-30% by echo 06/17/12- (pt is on a Life Vest)  PVD, 90% Rt RAS, 80% Rt Iliac at cath  CAD, 90% LM, Total RCA, 80-90% CFX- S/P urgent CABG X 11 Jun 2012  Status post angioplasty with stent, of High grade Lt ICA 10/14/12  Hypotension- post carotid stent  Tobacco abuse  HLD (hyperlipidemia)  H/O cardiac arrest, Sept 2013  LBBB (left bundle branch block)    Procedures: Lt ICA stent 10/14/12   Hospital Course: Jonathon Ryan is a 57 year old thin appearing married Caucasian male in no children who has a history of a witnessed cardiac arrest bystander CPR defibrillation, artic sun, who presented for emergent cardiac catheterization by myself revealing left main/three-vessel disease. Underwent emergency coronary artery bypass grafting by Dr. Kathlee Nations Trigt with a LIMA to his LAD, a vein to PDA, sequentially into OM 1 and OM 2. His ejection fraction has remained in the 25-30% range. 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 He made a full neurologic recovery. Carotid Dopplers revealed a high-grade left internal carotid artery stenosis. He was seen by neurology who recommended carotid artery stenting because he was high risk for endarterectomy. He was admitted 1/7/14as an OP after being on aspirin/ Plavix for elective cerebral angiography and carotid artery stenting under the "sapphire" protocol. This was done 10/14/12 by Dr [name], see his OP note for complete details. The procedure was successful and the pt tolerated it well. Post op he was hypotensive. He was admitted to  Step down. He did require a short course of Dopamine and IVF. We held his Coreg and Lisinopril. Dr [name] would like his Coreg dose cut back  at discharge. He will follow up with an extender in one week and Dr [name] in 4-6 weeks. We can resume his Lisinopril as an OP if his B/P remains stable. He will need an echo towards the end of March.   Discharge Vitals:  Blood pressure 118/59, pulse 79, temperature 98 F (36.7 C), temperature source Oral, resp. rate 16, height 5' 10.5" (1.791 m), weight 74.2 kg (163 lb 9.3 oz), SpO2 98.00%.    Labs: Results for orders placed during the hospital encounter of 10/14/12 (from the past 48 hour(s))  MRSA PCR SCREENING     Status: Normal   Collection Time   10/14/12 12:35 PM      Component Value Range Comment   MRSA by PCR NEGATIVE  NEGATIVE   TROPONIN I     Status: Normal   Collection Time   10/14/12  1:33 PM      Component Value Range Comment   Troponin I <0.30  <0.30 ng/mL   TROPONIN I     Status: Normal   Collection Time   10/14/12  9:40 PM      Component Value Range Comment   Troponin I <0.30  <0.30 ng/mL   TROPONIN I     Status: Normal   Collection Time   10/15/12  4:52 AM      Component Value Range Comment   Troponin I <0.30  <0.30 ng/mL   BASIC METABOLIC PANEL     Status: Abnormal   Collection Time   10/15/12  4:52 AM  Component Value Range Comment   Sodium 135  135 - 145 mEq/L    Potassium 4.2  3.5 - 5.1 mEq/L    Chloride 99  96 - 112 mEq/L    CO2 21  19 - 32 mEq/L    Glucose, Bld 115 (*) 70 - 99 mg/dL    BUN 26 (*) 6 - 23 mg/dL    Creatinine, Ser 7.82 (*) 0.50 - 1.35 mg/dL    Calcium 9.8  8.4 - 95.6 mg/dL    GFR calc non Af Amer 47 (*) >90 mL/min    GFR calc Af Amer 54 (*) >90 mL/min   CBC     Status: Abnormal   Collection Time   10/15/12  4:52 AM      Component Value Range Comment   WBC 11.8 (*) 4.0 - 10.5 K/uL    RBC 4.55  4.22 - 5.81 MIL/uL    Hemoglobin 12.9 (*) 13.0 - 17.0 g/dL    HCT 21.3 (*) 08.6 - 52.0 %    MCV 82.4  78.0 - 100.0 fL    MCH 28.4  26.0 - 34.0 pg    MCHC 34.4  30.0 - 36.0 g/dL    RDW 57.8  46.9 - 62.9 %    Platelets 254  150 - 400 K/uL   BASIC  METABOLIC PANEL     Status: Abnormal   Collection Time   10/16/12  4:45 AM      Component Value Range Comment   Sodium 136  135 - 145 mEq/L    Potassium 4.3  3.5 - 5.1 mEq/L    Chloride 104  96 - 112 mEq/L    CO2 25  19 - 32 mEq/L    Glucose, Bld 95  70 - 99 mg/dL    BUN 18  6 - 23 mg/dL    Creatinine, Ser 5.28  0.50 - 1.35 mg/dL    Calcium 9.5  8.4 - 41.3 mg/dL    GFR calc non Af Amer 76 (*) >90 mL/min    GFR calc Af Amer 88 (*) >90 mL/min   CBC     Status: Abnormal   Collection Time   10/16/12  4:45 AM      Component Value Range Comment   WBC 6.9  4.0 - 10.5 K/uL    RBC 3.97 (*) 4.22 - 5.81 MIL/uL    Hemoglobin 10.9 (*) 13.0 - 17.0 g/dL    HCT 24.4 (*) 01.0 - 52.0 %    MCV 82.9  78.0 - 100.0 fL    MCH 27.5  26.0 - 34.0 pg    MCHC 33.1  30.0 - 36.0 g/dL    RDW 27.2  53.6 - 64.4 %    Platelets 216  150 - 400 K/uL     Disposition:      Follow-up Information    Follow up with Abelino Derrick, PA. On 10/27/2012. (9:00am)    Contact information:   [address] [place] [address] [phone]          Discharge Medications:    Medication List     As of 10/16/2012 11:21 AM    STOP taking these medications         HYDROcodone-acetaminophen 5-325 MG per tablet   Commonly known as: NORCO/VICODIN      lisinopril 5 MG tablet   Commonly known as: PRINIVIL,ZESTRIL      TAKE these medications         acetaminophen  325 MG tablet   Commonly known as: TYLENOL   Take 2 tablets (650 mg total) by mouth every 4 (four) hours as needed.      aspirin 325 MG EC tablet   Take 1 tablet (325 mg total) by mouth daily.      atorvastatin 20 MG tablet   Commonly known as: LIPITOR   Take 20 mg by mouth every evening.      carvedilol 25 MG tablet   Commonly known as: COREG   Take 0.5 tablets (12.5 mg total) by mouth 2 (two) times daily.      clopidogrel 75 MG tablet   Commonly known as: PLAVIX   Take 75 mg by mouth daily.      divalproex 500 MG 24 hr tablet    Commonly known as: DEPAKOTE ER   Take 500 mg by mouth daily at 12 noon.      esomeprazole 40 MG capsule   Commonly known as: NEXIUM   Take 40 mg by mouth daily before breakfast.            Duration of Discharge Encounter: Greater than 30 minutes including physician time.  Jolene Provost PA-C 10/16/2012 11:21 AM

## 2012-10-28 ENCOUNTER — Ambulatory Visit (HOSPITAL_COMMUNITY)
Admission: RE | Admit: 2012-10-28 | Discharge: 2012-10-28 | Disposition: A | Discharge status: Home / Self Care | Payer: BC Managed Care – PPO | Source: Ambulatory Visit | Attending: Cardiovascular Disease | Admitting: Cardiovascular Disease

## 2012-10-28 ENCOUNTER — Other Ambulatory Visit (HOSPITAL_COMMUNITY): Payer: Self-pay | Admitting: Cardiovascular Disease

## 2012-10-28 DIAGNOSIS — I6529 Occlusion and stenosis of unspecified carotid artery: Secondary | ICD-10-CM | POA: Insufficient documentation

## 2012-10-28 DIAGNOSIS — I739 Peripheral vascular disease, unspecified: Secondary | ICD-10-CM | POA: Insufficient documentation

## 2012-10-28 NOTE — Progress Notes (Signed)
Left carotid duplex completed only. Jonathon Ryan

## 2012-10-28 NOTE — Progress Notes (Deleted)
Right carotid duplex doppler completed. Jonathon Ryan

## 2012-11-06 ENCOUNTER — Encounter: Payer: Self-pay | Admitting: *Deleted

## 2012-11-06 ENCOUNTER — Encounter: Payer: Self-pay | Admitting: Internal Medicine

## 2012-11-06 ENCOUNTER — Ambulatory Visit (INDEPENDENT_AMBULATORY_CARE_PROVIDER_SITE_OTHER): Payer: BC Managed Care – PPO | Admitting: Internal Medicine

## 2012-11-06 VITALS — BP 165/86 | HR 78 | Ht 70.0 in | Wt 167.8 lb

## 2012-11-06 DIAGNOSIS — I255 Ischemic cardiomyopathy: Secondary | ICD-10-CM

## 2012-11-06 DIAGNOSIS — I959 Hypotension, unspecified: Secondary | ICD-10-CM

## 2012-11-06 DIAGNOSIS — I447 Left bundle-branch block, unspecified: Secondary | ICD-10-CM

## 2012-11-06 DIAGNOSIS — I2589 Other forms of chronic ischemic heart disease: Secondary | ICD-10-CM

## 2012-11-06 DIAGNOSIS — I6529 Occlusion and stenosis of unspecified carotid artery: Secondary | ICD-10-CM

## 2012-11-06 LAB — CBC WITH DIFFERENTIAL/PLATELET
Basophils Relative: 0.3 % (ref 0.0–3.0)
Eosinophils Relative: 2.9 % (ref 0.0–5.0)
Lymphocytes Relative: 27.3 % (ref 12.0–46.0)
MCV: 85.3 fl (ref 78.0–100.0)
Monocytes Relative: 7 % (ref 3.0–12.0)
Neutrophils Relative %: 62.5 % (ref 43.0–77.0)
Platelets: 308 10*3/uL (ref 150.0–400.0)
RBC: 4.98 Mil/uL (ref 4.22–5.81)
WBC: 8.5 10*3/uL (ref 4.5–10.5)

## 2012-11-06 LAB — BASIC METABOLIC PANEL
BUN: 15 mg/dL (ref 6–23)
Calcium: 10.2 mg/dL (ref 8.4–10.5)
Chloride: 104 mEq/L (ref 96–112)
Creatinine, Ser: 1 mg/dL (ref 0.4–1.5)
GFR: 77.99 mL/min (ref >60.00)

## 2012-11-06 NOTE — Patient Instructions (Addendum)
Your physician has recommended that you have a Bi-V ICD inserted. An implantable cardioverter defibrillator (ICD) is a small device that is placed in your chest or, in rare cases, your abdomen. This device uses electrical pulses or shocks to help control life-threatening, irregular heartbeats that could lead the heart to suddenly stop beating (sudden cardiac arrest). Leads are attached to the ICD that goes into your heart. This is done in the hospital and usually requires an overnight stay. Please see the instruction sheet given to you today for more information.  Restart Lisinopril 5mg  daily

## 2012-11-11 ENCOUNTER — Other Ambulatory Visit: Payer: Self-pay | Admitting: *Deleted

## 2012-11-11 DIAGNOSIS — I255 Ischemic cardiomyopathy: Secondary | ICD-10-CM

## 2012-11-12 ENCOUNTER — Encounter (HOSPITAL_COMMUNITY): Payer: Self-pay | Admitting: Pharmacy Technician

## 2012-11-17 ENCOUNTER — Encounter: Payer: Self-pay | Admitting: Internal Medicine

## 2012-11-17 NOTE — Assessment & Plan Note (Signed)
Doing well s/p recent surgery I have spoken with Dr [name] today who is OK with holding plavix for 5 days prior to BiV ICD implantation.  As hypotension has resolved, I will restart ace inhibitor today also.

## 2012-11-17 NOTE — Assessment & Plan Note (Signed)
The patient has an ischemic CM (EF 25%), NYHA Class III CHF, LBBB and CAD. He has been treated with an optimal medical regimen.  Due to perioperative hypotension related to recent carotid surgery, he was briefly taken off of his ace inhibitor, however this is restarted today.  At this time, he meets MADIT II/ SCD-HeFT criteria for ICD implantation for primary prevention of sudden death.  He has a LBBB with QRS > and therefore is also a candidate for CRT.  Risks, benefits, alternatives to BiV ICD implantation were discussed in detail with the patient today. The patient  understands that the risks include but are not limited to bleeding, infection, pneumothorax, perforation, tamponade, vascular damage, renal failure, MI, stroke, death, inappropriate shocks, and lead dislodgement and wishes to proceed.  We will therefore schedule device implantation at the next available time.

## 2012-11-17 NOTE — Assessment & Plan Note (Signed)
Resolved As above

## 2012-11-17 NOTE — Progress Notes (Signed)
Primary Care Physician: Jonathon Hector, MD Referring Physician:   [name] is a 57 y.o. male with a h/o ischemic CM, CAD s/p CABG 9/13, NYHA Class III CHF and LBBB who presents today for EP consultation regarding risk stratification for sudden death.  He reports being in good health until 9/13 when he presented with aborted sudden death, requiring cooling.  Cath reveale severe LM disease and he underwent multivessel CABG 06/11/12.  He has done reasonably well since that time.  He has been treated with an optimal medical regimen however, his EF remains depressed at 25-30%.  He reports dyspnea with moderate activity including walking any significant distance.  He has been wearing a LifeVest since surgery. Today, he denies symptoms of palpitations, chest pain,  orthopnea, PND, lower extremity edema, dizziness, presyncope, syncope, or neurologic sequela. The patient is tolerating medications without difficulties and is otherwise without complaint today.   Past Medical History  Diagnosis Date  . Chronic systolic dysfunction of left ventricle   . Hyperlipidemia   . Cerebrovascular disease 10/15/2012  . Left bundle branch block   . Ischemic cardiomyopathy   . Cardiac arrest due to underlying cardiac condition     successfully rescucitated/ cooled  . CAD (coronary artery disease) 9/13    s/p CABG   Past Surgical History  Procedure Laterality Date  . Coronary artery bypass graft  06/11/2012    Procedure: CORONARY ARTERY BYPASS GRAFTING (CABG);  Surgeon: Jonathon Perna, MD;  Location: [facility] OR;  Service: Open Heart Surgery;  Laterality: [name];  Coronary Artery Bypass Grafting times four using left internal mammary artery and right greater saphenous vein endoscopically harvested.  [name] Percutaneous placement intravascular stent cervical carotid artery      Current Outpatient Prescriptions  Medication Sig Dispense Refill  . acetaminophen (TYLENOL) 325 MG tablet Take 2 tablets (650 mg total) by  mouth every 4 (four) hours as needed.      [name] aspirin EC 325 MG EC tablet Take 1 tablet (325 mg total) by mouth daily.      [name] atorvastatin (LIPITOR) 20 MG tablet Take 20 mg by mouth every evening.       . carvedilol (COREG) 25 MG tablet Take 0.5 tablets (12.5 mg total) by mouth 2 (two) times daily.      . clopidogrel (PLAVIX) 75 MG tablet Take 75 mg by mouth daily.      . divalproex (DEPAKOTE ER) 500 MG 24 hr tablet Take 500 mg by mouth daily at 12 noon.      [name] esomeprazole (NEXIUM) 40 MG capsule Take 40 mg by mouth daily before breakfast.      . lisinopril (PRINIVIL,ZESTRIL) 10 MG tablet Take 5 mg by mouth daily.       No current facility-administered medications for this visit.    No Known Allergies  History   Social History  . Marital Status: Married    Spouse Name: [name]    Number of Children: [name]  . Years of Education: [name]   Occupational History  . Not on file.   Social History Main Topics  . Smoking status: Former Smoker -- 2.00 packs/day for 20 years    Types: Cigarettes    Start date: 06/11/2012  . Smokeless tobacco: Never Used  . Alcohol Use: 8.4 oz/week    14 Shots of liquor per week     Comment: occasional  . Drug Use: No  . Sexually Active: Yes   Other Topics Concern  . Not  on file   Social History Narrative  . No narrative on file    Family History  Problem Relation Age of Onset  . Cancer Brother     ROS- All systems are reviewed and negative except as per the HPI above  Physical Exam: Filed Vitals:   11/06/12 1153  BP: 165/86  Pulse: 78  Height: 5\' 10"  (1.778 m)  Weight: 167 lb 12.8 oz (76.114 kg)    GEN- The patient is well appearing, alert and oriented x 3 today.   Head- normocephalic, atraumatic Eyes-  Sclera clear, conjunctiva pink Ears- hearing intact Oropharynx- clear Neck- supple, no JVP Lymph- no cervical lymphadenopathy Lungs- Clear to ausculation bilaterally, normal work of breathing Heart- Regular rate and rhythm, no murmurs,  rubs or gallops, PMI not laterally displaced GI- soft, NT, ND, + BS Extremities- no clubbing, cyanosis, or edema MS- no significant deformity or atrophy Skin- no rash or lesion Psych- euthymic mood, full affect Neuro- strength and sensation are intact  Echo 09/12/12- EF 25-30%, moderate MR  EKG 10/31/12- sinus rhythm , LBBB (QRS 164 msec)   Assessment and Plan:

## 2012-11-17 NOTE — Assessment & Plan Note (Signed)
As above.

## 2012-11-21 ENCOUNTER — Encounter (HOSPITAL_COMMUNITY): Admission: RE | Payer: Self-pay | Source: Ambulatory Visit

## 2012-11-21 ENCOUNTER — Ambulatory Visit (HOSPITAL_COMMUNITY): Admission: RE | Admit: 2012-11-21 | Payer: Medicaid Other | Source: Ambulatory Visit | Admitting: Internal Medicine

## 2012-11-21 SURGERY — BI-VENTRICULAR IMPLANTABLE CARDIOVERTER DEFIBRILLATOR  (CRT-D)
Anesthesia: LOCAL

## 2012-11-24 ENCOUNTER — Telehealth: Payer: Self-pay | Admitting: Internal Medicine

## 2012-11-24 ENCOUNTER — Encounter (HOSPITAL_COMMUNITY): Payer: Self-pay | Admitting: Pharmacy Technician

## 2012-11-24 MED ORDER — SODIUM CHLORIDE 0.9 % IV SOLN: 250.0000 mL | INTRAVENOUS | Status: DC

## 2012-11-24 MED ORDER — SODIUM CHLORIDE 0.9 % IJ SOLN: 3.0000 mL | Freq: Two times a day (BID) | INTRAMUSCULAR | Status: DC

## 2012-11-24 MED ORDER — SODIUM CHLORIDE 0.9 % IJ SOLN: 3.0000 mL | INTRAMUSCULAR | Status: DC | PRN

## 2012-11-24 MED ORDER — SODIUM CHLORIDE 0.9 % IR SOLN
80.0000 mg | Status: DC
  Filled 2012-11-24: qty 2

## 2012-11-24 MED ORDER — SODIUM CHLORIDE 0.45 % IV SOLN
INTRAVENOUS | Status: DC
  Administered 2012-11-25: 13:00:00 via INTRAVENOUS

## 2012-11-24 MED ORDER — CHLORHEXIDINE GLUCONATE 4 % EX LIQD
60.0000 mL | Freq: Once | CUTANEOUS | Status: DC
  Filled 2012-11-24: qty 60

## 2012-11-24 MED ORDER — CEFAZOLIN SODIUM-DEXTROSE 2-3 GM-% IV SOLR
2.0000 g | INTRAVENOUS | Status: DC
  Filled 2012-11-24: qty 50

## 2012-11-24 NOTE — Telephone Encounter (Signed)
New Problem    Pt has some questions regarding medication. In addition, pt has questions regarding his appt at short stay. Would like to speak to nurse.

## 2012-11-24 NOTE — Telephone Encounter (Signed)
Spoke with Pt and rescheduled to tomorrow 2/18

## 2012-11-25 ENCOUNTER — Encounter (HOSPITAL_COMMUNITY)
Admission: RE | Disposition: A | Discharge status: Home / Self Care | Payer: Self-pay | Source: Ambulatory Visit | Attending: Internal Medicine

## 2012-11-25 ENCOUNTER — Encounter (HOSPITAL_COMMUNITY): Payer: Self-pay | Admitting: *Deleted

## 2012-11-25 ENCOUNTER — Ambulatory Visit (HOSPITAL_COMMUNITY)
Admission: RE | Admit: 2012-11-25 | Discharge: 2012-11-26 | Disposition: A | Discharge status: Home / Self Care | Payer: Medicaid Other | Source: Ambulatory Visit | Attending: Internal Medicine | Admitting: Internal Medicine

## 2012-11-25 DIAGNOSIS — I5022 Chronic systolic (congestive) heart failure: Secondary | ICD-10-CM | POA: Insufficient documentation

## 2012-11-25 DIAGNOSIS — I509 Heart failure, unspecified: Secondary | ICD-10-CM | POA: Insufficient documentation

## 2012-11-25 DIAGNOSIS — I2589 Other forms of chronic ischemic heart disease: Secondary | ICD-10-CM | POA: Insufficient documentation

## 2012-11-25 DIAGNOSIS — I2510 Atherosclerotic heart disease of native coronary artery without angina pectoris: Secondary | ICD-10-CM | POA: Diagnosis present

## 2012-11-25 DIAGNOSIS — I255 Ischemic cardiomyopathy: Secondary | ICD-10-CM | POA: Diagnosis present

## 2012-11-25 DIAGNOSIS — Z8674 Personal history of sudden cardiac arrest: Secondary | ICD-10-CM | POA: Insufficient documentation

## 2012-11-25 DIAGNOSIS — I447 Left bundle-branch block, unspecified: Secondary | ICD-10-CM | POA: Diagnosis present

## 2012-11-25 HISTORY — PX: BI-VENTRICULAR IMPLANTABLE CARDIOVERTER DEFIBRILLATOR: SHX5459

## 2012-11-25 HISTORY — DX: Headache: R51

## 2012-11-25 LAB — SURGICAL PCR SCREEN: Staphylococcus aureus: NEGATIVE

## 2012-11-25 SURGERY — BI-VENTRICULAR IMPLANTABLE CARDIOVERTER DEFIBRILLATOR  (CRT-D)
Anesthesia: LOCAL

## 2012-11-25 MED ORDER — SODIUM CHLORIDE 0.9 % IJ SOLN: 3.0000 mL | Freq: Two times a day (BID) | INTRAMUSCULAR | Status: DC

## 2012-11-25 MED ORDER — MUPIROCIN 2 % EX OINT
TOPICAL_OINTMENT | Freq: Two times a day (BID) | CUTANEOUS | Status: DC
  Administered 2012-11-25: 13:00:00 via NASAL

## 2012-11-25 MED ORDER — LIDOCAINE HCL (PF) 1 % IJ SOLN
INTRAMUSCULAR | Status: AC
  Filled 2012-11-25: qty 60

## 2012-11-25 MED ORDER — FENTANYL CITRATE 0.05 MG/ML IJ SOLN
INTRAMUSCULAR | Status: AC
  Filled 2012-11-25: qty 2

## 2012-11-25 MED ORDER — CEFAZOLIN SODIUM 1-5 GM-% IV SOLN
1.0000 g | Freq: Four times a day (QID) | INTRAVENOUS | Status: AC
Start: 2012-11-25 — End: 2012-11-26
  Administered 2012-11-25 – 2012-11-26 (×3): 1 g via INTRAVENOUS
  Filled 2012-11-25 (×3): qty 50

## 2012-11-25 MED ORDER — LISINOPRIL 5 MG PO TABS
5.0000 mg | ORAL_TABLET | Freq: Every day | ORAL | Status: DC
  Administered 2012-11-26: 5 mg via ORAL
  Filled 2012-11-25 (×2): qty 1

## 2012-11-25 MED ORDER — MIDAZOLAM HCL 5 MG/5ML IJ SOLN
INTRAMUSCULAR | Status: AC
  Filled 2012-11-25: qty 5

## 2012-11-25 MED ORDER — ASPIRIN EC 325 MG PO TBEC
325.0000 mg | DELAYED_RELEASE_TABLET | Freq: Every day | ORAL | Status: DC
  Administered 2012-11-26: 325 mg via ORAL
  Filled 2012-11-25: qty 1

## 2012-11-25 MED ORDER — ONDANSETRON HCL 4 MG/2ML IJ SOLN: 4.0000 mg | Freq: Four times a day (QID) | INTRAMUSCULAR | Status: DC | PRN

## 2012-11-25 MED ORDER — HEPARIN (PORCINE) IN NACL 2-0.9 UNIT/ML-% IJ SOLN
INTRAMUSCULAR | Status: AC
  Filled 2012-11-25: qty 500

## 2012-11-25 MED ORDER — DIVALPROEX SODIUM ER 500 MG PO TB24
500.0000 mg | ORAL_TABLET | Freq: Every day | ORAL | Status: DC
  Filled 2012-11-25: qty 1

## 2012-11-25 MED ORDER — ATORVASTATIN CALCIUM 20 MG PO TABS
20.0000 mg | ORAL_TABLET | Freq: Every evening | ORAL | Status: DC
  Administered 2012-11-25: 20 mg via ORAL
  Filled 2012-11-25 (×3): qty 1

## 2012-11-25 MED ORDER — HYDROCODONE-ACETAMINOPHEN 5-325 MG PO TABS
1.0000 | ORAL_TABLET | ORAL | Status: DC | PRN
Start: 2012-11-25 — End: 2012-11-26
  Administered 2012-11-25: 1 via ORAL
  Filled 2012-11-25: qty 1

## 2012-11-25 MED ORDER — CEFAZOLIN SODIUM-DEXTROSE 2-3 GM-% IV SOLR
INTRAVENOUS | Status: AC
  Filled 2012-11-25: qty 50

## 2012-11-25 MED ORDER — LORAZEPAM 0.5 MG PO TABS: 0.5000 mg | ORAL_TABLET | Freq: Three times a day (TID) | ORAL | Status: DC | PRN

## 2012-11-25 MED ORDER — MUPIROCIN 2 % EX OINT
TOPICAL_OINTMENT | CUTANEOUS | Status: AC
  Filled 2012-11-25: qty 22

## 2012-11-25 MED ORDER — SODIUM CHLORIDE 0.9 % IV SOLN: 250.0000 mL | INTRAVENOUS | Status: DC | PRN

## 2012-11-25 MED ORDER — CARVEDILOL 12.5 MG PO TABS
12.5000 mg | ORAL_TABLET | Freq: Two times a day (BID) | ORAL | Status: DC
  Administered 2012-11-25 – 2012-11-26 (×2): 12.5 mg via ORAL
  Filled 2012-11-25 (×3): qty 1

## 2012-11-25 MED ORDER — LIDOCAINE HCL (PF) 1 % IJ SOLN
INTRAMUSCULAR | Status: AC
  Filled 2012-11-25: qty 30

## 2012-11-25 MED ORDER — SODIUM CHLORIDE 0.9 % IJ SOLN: 3.0000 mL | INTRAMUSCULAR | Status: DC | PRN

## 2012-11-25 MED ORDER — ACETAMINOPHEN 325 MG PO TABS: 325.0000 mg | ORAL_TABLET | ORAL | Status: DC | PRN

## 2012-11-25 NOTE — Interval H&P Note (Signed)
History and Physical Interval Note:  11/25/2012 2:01 PM  Jonathon Ryan  has presented today for surgery, with the diagnosis of chf  The various methods of treatment have been discussed with the patient and family. After consideration of risks, benefits and other options for treatment, the patient has consented to  Procedure(s): BI-VENTRICULAR IMPLANTABLE CARDIOVERTER DEFIBRILLATOR  (CRT-D) ([name]) as a surgical intervention .  The patient's history has been reviewed, patient examined, no change in status, stable for surgery.  I have reviewed the patient's chart and labs.  Questions were answered to the patient's satisfaction.     Hillis Range

## 2012-11-25 NOTE — Op Note (Signed)
SURGEON:  Hillis Range, MD      PREPROCEDURE DIAGNOSES:   1. Ischemic cardiomyopathy.   2. New York Heart Association class III, heart failure chronically.   3. Left bundle-branch block.      POSTPROCEDURE DIAGNOSES:   1. Ischemic cardiomyopathy.   2. New York Heart Association class III heart failure chronically.   3. Left bundle-branch block.      PROCEDURES:    1. Left upper extremity venography  2. Biventricular ICD implantation.  3. Defibrillation threshold testing     INTRODUCTION:  Jonathon Ryan is a 57 y.o. male with an ischemic CM (EF 25%), NYHA Class III CHF, LBBB and CAD. He has been treated with an optimal medical regimen.  At this time, he meets MADIT II/ SCD-HeFT criteria for ICD implantation for primary prevention of sudden death.  Given LBBB, the patient may also be expected to benefit from resynchronization therapy.  The patient has been treated with an optimal medical regimen but continues to have a depressed ejection fraction and NYHA Class III CHF symptoms.  he therefore  presents today for a biventricular ICD implantation.      DESCRIPTION OF PROCEDURE:  Informed written consent was obtained and the patient was brought to the electrophysiology lab in the fasting state. The patient was adequately sedated with intravenous Versed, and fentanyl as outlined in the nursing report.  The patient's left chest was prepped and draped in the usual sterile fashion by the EP lab staff.  The skin overlying the left deltopectoral region was infiltrated with lidocaine for local analgesia.  A 5-cm incision was made over the left deltopectoral region.  A left subcutaneous defibrillator pocket was fashioned using a combination of sharp and blunt dissection.  Electrocautery was used to assure hemostasis.   Left Upper extremity Venography:  A venogram of the left upper extremity was performed which revealed a moderate sized left axillary vein which emptied into a moderate sized left subclavian  vein.    RA/RV Lead Placement: The left subclavian vein was cannulated due to spasm of the left axillary vein with fluoroscopic visualization. Through the left subclavian vein, a [name] [place], model  1610RU-04  (serial # [id number]) right atrial lead and a [name] [place], model 5409-81X (serial number [id number]) right ventricular defibrillator lead were advanced with fluoroscopic visualization into the right atrial appendage and right ventricular apex positions respectively.  Initial atrial lead P-waves measured 4.7 mV with an impedance of 473 ohms and a threshold of 0.7 volts at 0.5 milliseconds.  The right ventricular lead R-wave measured 21 mV with impedance of 538 ohms and a threshold of 0.8 volts at 0.5 milliseconds.   LV Lead Placement: A Medtronic MB-2 guide was advanced through the left subclavian vein into the low lateral right atrium.  A Bard curved Damato catheter was introduced through the MB-2 guide and used to cannulate the coronary sinus.  Due to angulation, the CS could not be cannulated.  Eventually, an ampliance guide was used to advanced a Wholly wire into the CS.  Coronary sinus cannulation was confirmed with hand injection of nonionic contrast.  This demonstrated a large coronary sinus body with a large lateral branch.   A Whisper CSJ wire was introduced through the transseptal sheath and advanced into the lateral branch.  A [name] Quartet model [phone] - 86 (serial number [id number]) lead was advanced through the guide into the lateral branch.   This was  approximately mid way from  the base to the apex in a very lateral position.  In this location, the left ventricular lead R-waves measured  9 mV with impedance of 523 ohms and a threshold of 1 volt at 0.5  milliseconds in the bipolar configuration with no diaphragmatic  stimulation observed when pacing at 10 volts output.  The guide was  therefore removed.     All three leads were secured to the pectoralis   fascia using #2 silk suture over the suture sleeves.  The pocket then  irrigated with copious gentamicin solution.  The leads were then  connected to a [name] Quadra Assura model CD [phone] - [phone] (serial  Number [id number]) biventricular ICD.  The defibrillator was placed into the  pocket.  The pocket was then closed in 2 layers with 2.0 Vicryl suture  for the subcutaneous and subcuticular layers.  Steri-Strips and a  sterile dressing were then applied.   DFT Testing: Defibrillation Threshold testing was then performed. Ventricular fibrillation was induced with a T shock.  Adequate sensing of ventricular  fibrillation was observed with minimal dropout with a programmed sensitivity of 1.86mV.  The patient was successfully defibrillated to sinus rhythm with a single 15 joules shock delivered from the device with an impedance of 68 ohms in a duration of 4 seconds.  The patient remained in sinus rhythm thereafter.  There were no early apparent complications.  Programmed Extrastimulus testing:  Programmed extrastimulus testing was performed through the device with a basic cycle length of with S1,S2,S3,S4 extrastimuli down to refractoriness (500/270/250/200 msec) with no sustained VT or VF observed.  The procedure was therefore considered completed.  There were no early apparhent complications.     CONCLUSIONS:   1. Ischemic cardiomyopathy with Left bundle-branch block and chronic New York Heart Association class III heart failure.   2. Successful biventricular ICD implantation.   3. DFT less than or equal to 15 joules.   4. No inducible VT or VF with PES  5. No early apparent complications.   Fayrene Fearing Shell Yandow,MD

## 2012-11-25 NOTE — H&P (View-Only) (Signed)
 Primary Care Physician: NYLAND,[name], MD Referring Physician:   Jonathon Ryan is a 56 y.o. male with a h/o ischemic CM, CAD s/p CABG 9/13, NYHA Class III CHF and LBBB who presents today for EP consultation regarding risk stratification for sudden death.  He reports being in good health until 9/13 when he presented with aborted sudden death, requiring cooling.  Cath reveale severe LM disease and he underwent multivessel CABG 06/11/12.  He has done reasonably well since that time.  He has been treated with an optimal medical regimen however, his EF remains depressed at 25-30%.  He reports dyspnea with moderate activity including walking any significant distance.  He has been wearing a LifeVest since surgery. Today, he denies symptoms of palpitations, chest pain,  orthopnea, PND, lower extremity edema, dizziness, presyncope, syncope, or neurologic sequela. The patient is tolerating medications without difficulties and is otherwise without complaint today.   Past Medical History  Diagnosis Date  . Chronic systolic dysfunction of left ventricle   . Hyperlipidemia   . Cerebrovascular disease 10/15/2012  . Left bundle branch block   . Ischemic cardiomyopathy   . Cardiac arrest due to underlying cardiac condition     successfully rescucitated/ cooled  . CAD (coronary artery disease) 9/13    s/p CABG   Past Surgical History  Procedure Laterality Date  . Coronary artery bypass graft  06/11/2012    Procedure: CORONARY ARTERY BYPASS GRAFTING (CABG);  Surgeon: Peter Van Trigt, MD;  Location: [facility] OR;  Service: Open Heart Surgery;  Laterality: [name];  Coronary Artery Bypass Grafting times four using left internal mammary artery and right greater saphenous vein endoscopically harvested.  . Percutaneous placement intravascular stent cervical carotid artery      Current Outpatient Prescriptions  Medication Sig Dispense Refill  . acetaminophen (TYLENOL) 325 MG tablet Take 2 tablets (650 mg total) by  mouth every 4 (four) hours as needed.      . aspirin EC 325 MG EC tablet Take 1 tablet (325 mg total) by mouth daily.      . atorvastatin (LIPITOR) 20 MG tablet Take 20 mg by mouth every evening.       . carvedilol (COREG) 25 MG tablet Take 0.5 tablets (12.5 mg total) by mouth 2 (two) times daily.      . clopidogrel (PLAVIX) 75 MG tablet Take 75 mg by mouth daily.      . divalproex (DEPAKOTE ER) 500 MG 24 hr tablet Take 500 mg by mouth daily at 12 noon.      . esomeprazole (NEXIUM) 40 MG capsule Take 40 mg by mouth daily before breakfast.      . lisinopril (PRINIVIL,ZESTRIL) 10 MG tablet Take 5 mg by mouth daily.       No current facility-administered medications for this visit.    No Known Allergies  History   Social History  . Marital Status: Married    Spouse Name: [name]    Number of Children: [name]  . Years of Education: [name]   Occupational History  . Not on file.   Social History Main Topics  . Smoking status: Former Smoker -- 2.00 packs/day for 20 years    Types: Cigarettes    Start date: 06/11/2012  . Smokeless tobacco: Never Used  . Alcohol Use: 8.4 oz/week    14 Shots of liquor per week     Comment: occasional  . Drug Use: No  . Sexually Active: Yes   Other Topics Concern  . Not   on file   Social History Narrative  . No narrative on file    Family History  Problem Relation Age of Onset  . Cancer Brother     ROS- All systems are reviewed and negative except as per the HPI above  Physical Exam: Filed Vitals:   11/06/12 1153  BP: 165/86  Pulse: 78  Height: 5' 10" (1.778 m)  Weight: 167 lb 12.8 oz (76.114 kg)    GEN- The patient is well appearing, alert and oriented x 3 today.   Head- normocephalic, atraumatic Eyes-  Sclera clear, conjunctiva pink Ears- hearing intact Oropharynx- clear Neck- supple, no JVP Lymph- no cervical lymphadenopathy Lungs- Clear to ausculation bilaterally, normal work of breathing Heart- Regular rate and rhythm, no murmurs,  rubs or gallops, PMI not laterally displaced GI- soft, NT, ND, + BS Extremities- no clubbing, cyanosis, or edema MS- no significant deformity or atrophy Skin- no rash or lesion Psych- euthymic mood, full affect Neuro- strength and sensation are intact  Echo 09/12/12- EF 25-30%, moderate MR  EKG 10/31/12- sinus rhythm , LBBB (QRS 164 msec)   Assessment and Plan:  

## 2012-11-26 ENCOUNTER — Encounter (HOSPITAL_COMMUNITY): Payer: Self-pay | Admitting: Cardiology

## 2012-11-26 ENCOUNTER — Ambulatory Visit (HOSPITAL_COMMUNITY): Payer: Medicaid Other

## 2012-11-26 DIAGNOSIS — I2589 Other forms of chronic ischemic heart disease: Secondary | ICD-10-CM

## 2012-11-26 MED ORDER — YOU HAVE A PACEMAKER BOOK
Freq: Once | Status: DC
  Filled 2012-11-26: qty 1

## 2012-11-26 NOTE — Progress Notes (Signed)
Utilization Review Completed Avrielle Fry J. Donnajean Chesnut, RN, BSN, NCM [phone]  

## 2012-11-26 NOTE — Discharge Summary (Signed)
ELECTROPHYSIOLOGY DISCHARGE SUMMARY    Patient ID: Jonathon Ryan,  MRN: [id number], DOB/AGE: Feb 19, 1956 57 y.o.  Admit date: 11/25/2012 Discharge date: 11/26/2012  Primary Care Physician: Jonathon Hector, MD   Primary Cardiologist: Jonathon Batty, MD  Primary Discharge Diagnosis:  1. Ischemic CM, EF 25%, s/p BiV ICD implantation  2. Chronic systolic HF, NYHA class III 3. LBBB  Secondary Discharge Diagnoses:  1. CAD s/p CABG Sept 2013 2. Dyslipidemia  Procedures This Admission:  1. Left upper extremity venography  2. Biventricular ICD implantation.  3. Defibrillation threshold testing [name] [place], model 4098JX-91 (serial # [id number]) right atrial lead and a [name] [place], model 4782-95A (serial number [id number]) right ventricular defibrillator lead. [name] Quartet model [phone] - 86 (serial number [id number]) LV lead. [name] Quadra Assura model [place] 6578 [phone] (serial Number [id number]) biventricular ICD. The patient was successfully defibrillated to sinus rhythm with a single 15 joules shock delivered from the device with an impedance of 68 ohms in a duration of 4 seconds.   History and Hospital Course:  Jonathon Ryan is a 57 y.o. male with an ischemic CM (EF 25%), NYHA Class III CHF, LBBB and CAD. He has been treated with an optimal medical regimen but continues to have a depressed ejection fraction and NYHA Class III CHF symptoms. He meets MADIT II/ SCD-HeFT criteria for ICD implantation for primary prevention of sudden death. Given LBBB, he may benefit from resynchronization therapy. He presented yesterday for biventricular ICD implantation. Jonathon Ryan tolerated this procedure well without any immediate complication. He remains hemodynamically stable and afebrile. His chest xray shows stable lead placement without pneumothorax. His device interrogation this AM has been reviewed by Jonathon Ryan and shows normal BiV ICD function with stable lead  parameters/measurements. His implant site is intact without significant bleeding or hematoma. He has been given discharge instructions including wound care and activity restrictions. He will follow-up in 10 days for wound and device check. There were no changes made to his medications. He has been seen, examined and deemed stable for discharge today by Dr. Hillis Ryan.   Physical Exam: Vitals: Blood pressure 142/79, pulse 86, temperature 98.5 F (36.9 C), temperature source Oral, resp. rate 20, height 5\' 10"  (1.778 m), weight 170 lb 10.2 oz (77.4 kg), SpO2 96.00%.  General: Well developed, well appearing 57 year old male in no acute distress. Heart: RRR. S1, S2 without murmur, rub, S3 or S4.  Lungs: CTA bilaterally. No wheezes, rales or rhonchi. Abdomen: Soft, nondistended. Extremities: No cyanosis, clubbing or edema.  Skin: Left upper chest/implant site intact without bleeding or hematoma.  Labs: Lab Results  Component Value Date   WBC 8.5 11/06/2012   HGB 14.5 11/06/2012   HCT 42.5 11/06/2012   MCV 85.3 11/06/2012   PLT 308.0 11/06/2012   No results found for this basename: NA, K, CL, CO2, BUN, CREATININE, CALCIUM, LABALBU, PROT, BILITOT, ALKPHOS, ALT, AST, GLUCOSE,  in the last 168 hours    Disposition:  The patient is being discharged in stable condition.  Follow-up: Follow-up Information   Follow up with [facility] On 12/03/2012. (At 9:30 AM for wound check)    Contact information:   [address] [place] [address] [phone]      Follow up with Jonathon Range, MD On 03/04/2013. (At 9:00 AM)    Contact information:   [address] [place] [address] [phone]  Discharge Medications:    Medication List    TAKE these medications       aspirin 325 MG EC tablet  Take 1 tablet (325 mg total) by mouth daily.     atorvastatin 20 MG tablet  Commonly known as:  LIPITOR  Take 20 mg by mouth every evening.     carvedilol  25 MG tablet  Commonly known as:  COREG  Take 0.5 tablets (12.5 mg total) by mouth 2 (two) times daily.     clopidogrel 75 MG tablet  Commonly known as:  PLAVIX  Take 75 mg by mouth daily.     divalproex 500 MG 24 hr tablet  Commonly known as:  DEPAKOTE ER  Take 500 mg by mouth daily at 12 noon.     esomeprazole 40 MG capsule  Commonly known as:  NEXIUM  Take 40 mg by mouth daily before breakfast.     lisinopril 10 MG tablet  Commonly known as:  PRINIVIL,ZESTRIL  Take 5 mg by mouth daily.     LORazepam 0.5 MG tablet  Commonly known as:  ATIVAN  Take 0.5 mg by mouth every 8 (eight) hours as needed for anxiety. Anxiety      Duration of Discharge Encounter: Greater than 30 minutes including physician time.  Signed, Jonathon Duff, PA-C 11/26/2012, 8:36 AM  BiV ICD interrogation reviewed in detail (paper chart) and normal. Agree with above DC to home with routine device follow-up Jonathon Range, MD

## 2012-11-28 ENCOUNTER — Encounter: Payer: Self-pay | Admitting: Internal Medicine

## 2012-11-28 ENCOUNTER — Institutional Professional Consult (permissible substitution): Payer: BC Managed Care – PPO | Admitting: Internal Medicine

## 2012-12-03 ENCOUNTER — Encounter: Payer: Self-pay | Admitting: Internal Medicine

## 2012-12-03 ENCOUNTER — Ambulatory Visit (INDEPENDENT_AMBULATORY_CARE_PROVIDER_SITE_OTHER): Payer: Medicaid Other | Admitting: *Deleted

## 2012-12-03 ENCOUNTER — Other Ambulatory Visit: Payer: Self-pay | Admitting: Internal Medicine

## 2012-12-03 DIAGNOSIS — I2589 Other forms of chronic ischemic heart disease: Secondary | ICD-10-CM

## 2012-12-03 DIAGNOSIS — I255 Ischemic cardiomyopathy: Secondary | ICD-10-CM

## 2012-12-03 LAB — ICD DEVICE OBSERVATION
AL AMPLITUDE: 5 mv
AL THRESHOLD: 0.5 V
BAMS-0003: 70 {beats}/min
CHARGE TIME: 8.5 s
DEV-0020ICD: NEGATIVE
DEVICE MODEL ICD: 7077567
HV IMPEDENCE: 63 Ohm
MODE SWITCH EPISODES: 0
PACEART VT: 0
RV LEAD IMPEDENCE ICD: 362.5 Ohm
TOT-0006: 20140218000000
TOT-0007: 1
TOT-0009: 1
TOT-0010: 1
TZON-0003SLOWVT: 340 ms
TZON-0005SLOWVT: 6
TZON-0010SLOWVT: 40 ms
VF: 0

## 2012-12-03 NOTE — Progress Notes (Signed)
Wound check-ICD 

## 2012-12-25 ENCOUNTER — Other Ambulatory Visit (HOSPITAL_COMMUNITY): Payer: Self-pay | Admitting: Cardiovascular Disease

## 2012-12-25 DIAGNOSIS — I519 Heart disease, unspecified: Secondary | ICD-10-CM

## 2012-12-25 DIAGNOSIS — I739 Peripheral vascular disease, unspecified: Secondary | ICD-10-CM

## 2013-01-22 ENCOUNTER — Ambulatory Visit (HOSPITAL_COMMUNITY)
Admission: RE | Admit: 2013-01-22 | Discharge: 2013-01-22 | Disposition: A | Discharge status: Home / Self Care | Payer: BC Managed Care – PPO | Source: Ambulatory Visit | Attending: Cardiovascular Disease | Admitting: Cardiovascular Disease

## 2013-01-22 DIAGNOSIS — I519 Heart disease, unspecified: Secondary | ICD-10-CM | POA: Insufficient documentation

## 2013-01-22 NOTE — Progress Notes (Signed)
[facility]   2D echo completed 01/22/2013.   Cindy Undrea Shipes, RDCS  

## 2013-01-27 DIAGNOSIS — Z0271 Encounter for disability determination: Secondary | ICD-10-CM

## 2013-02-02 ENCOUNTER — Encounter: Payer: Self-pay | Admitting: Cardiovascular Disease

## 2013-02-19 ENCOUNTER — Telehealth: Payer: Self-pay | Admitting: *Deleted

## 2013-02-19 DIAGNOSIS — E785 Hyperlipidemia, unspecified: Secondary | ICD-10-CM

## 2013-02-19 DIAGNOSIS — Z79899 Other long term (current) drug therapy: Secondary | ICD-10-CM

## 2013-02-19 MED ORDER — FENOFIBRATE 145 MG PO TABS: 145.0000 mg | ORAL_TABLET | Freq: Every day | ORAL | Status: DC

## 2013-02-19 NOTE — Telephone Encounter (Signed)
Dr. [name] ordered tricor because outpatient lab showed trig level of 566. Also ordered follow up lipid and liver in 8 weeks.  Pt notified of results and new med

## 2013-03-04 ENCOUNTER — Encounter: Payer: Medicaid Other | Admitting: Internal Medicine

## 2013-03-12 ENCOUNTER — Encounter: Payer: Self-pay | Admitting: Internal Medicine

## 2013-03-12 ENCOUNTER — Ambulatory Visit (INDEPENDENT_AMBULATORY_CARE_PROVIDER_SITE_OTHER): Payer: BC Managed Care – PPO | Admitting: Internal Medicine

## 2013-03-12 VITALS — BP 154/83 | HR 60 | Ht 70.0 in | Wt 169.0 lb

## 2013-03-12 DIAGNOSIS — Z8674 Personal history of sudden cardiac arrest: Secondary | ICD-10-CM

## 2013-03-12 DIAGNOSIS — I255 Ischemic cardiomyopathy: Secondary | ICD-10-CM

## 2013-03-12 DIAGNOSIS — I2589 Other forms of chronic ischemic heart disease: Secondary | ICD-10-CM

## 2013-03-12 LAB — ICD DEVICE OBSERVATION
AL AMPLITUDE: 5 mv
AL THRESHOLD: 0.75 V
DEV-0020ICD: NEGATIVE
LV LEAD THRESHOLD: 0.75 V
RV LEAD AMPLITUDE: 12 mv
RV LEAD THRESHOLD: 1 V
TZON-0004SLOWVT: 30
TZON-0010SLOWVT: 40 ms

## 2013-03-12 NOTE — Progress Notes (Signed)
PCP: Josue Hector, MD Primary Cardiologist: Nanetta Batty, MD  Jonathon Ryan is a 57 y.o. male who presents today for routine electrophysiology followup.  Since last being seen in our clinic, the patient reports doing very well.  He reports improvement in functional capacity and less shortness of breath since implantation of CRTD 3 months ago.  He had a repeat echo done by Dr [name] recently which did not yet show improvement in ejection fraction.   Today, he denies symptoms of palpitations, chest pain, shortness of breath,  lower extremity edema, dizziness, presyncope, syncope, or ICD shocks.  The patient is otherwise without complaint today.   Past Medical History  Diagnosis Date  . Chronic systolic dysfunction of left ventricle   . Hyperlipidemia   . Cerebrovascular disease 10/15/2012  . Left bundle branch block   . Ischemic cardiomyopathy     s/p BiV ICD implant Feb 2014 - [address]. [name] [place], [place] 2130QM-57 (serial # [id number]) right atrial lead and a [name] [place], model 8469-62X (serial number [id number]) right ventricular defibrillator lead. [name] Quartet model [phone] - 86 (serial number [id number]) LV lead. [name] Quadra Assura model [place] 3244 [phone] (serial Number [id number]) biventricular ICD.   [name] Cardiac arrest due to underlying cardiac condition     successfully rescucitated/ cooled  . CAD (coronary artery disease) 9/13    s/p CABG  . Headache    Past Surgical History  Procedure Laterality Date  . Coronary artery bypass graft  06/11/2012    Procedure: CORONARY ARTERY BYPASS GRAFTING (CABG);  Surgeon: [name], MD;  Location: [facility] OR;  Service: Open Heart Surgery;  Laterality: [name];  Coronary Artery Bypass Grafting times four using left internal mammary artery and right greater saphenous vein endoscopically harvested.  [name] Percutaneous placement intravascular stent cervical carotid artery      Current Outpatient Prescriptions  Medication Sig  Dispense Refill  . aspirin EC 325 MG EC tablet Take 1 tablet (325 mg total) by mouth daily.      [name] atorvastatin (LIPITOR) 20 MG tablet Take 20 mg by mouth every evening.       . carvedilol (COREG) 25 MG tablet Take 0.5 tablets (12.5 mg total) by mouth 2 (two) times daily.      . clopidogrel (PLAVIX) 75 MG tablet Take 75 mg by mouth daily.      . divalproex (DEPAKOTE ER) 500 MG 24 hr tablet Take 500 mg by mouth daily at 12 noon.      [name] esomeprazole (NEXIUM) 40 MG capsule Take 40 mg by mouth daily before breakfast.      . fenofibrate (TRICOR) 145 MG tablet Take 1 tablet (145 mg total) by mouth daily.  30 tablet  11  . lisinopril (PRINIVIL,ZESTRIL) 10 MG tablet Take 5 mg by mouth daily.      [name] LORazepam (ATIVAN) 0.5 MG tablet Take 0.5 mg by mouth every 8 (eight) hours as needed for anxiety. Anxiety       No current facility-administered medications for this visit.    Physical Exam: There were no vitals filed for this visit.  GEN- The patient is well appearing, alert and oriented x 3 today.   Head- normocephalic, atraumatic Eyes-  Sclera clear, conjunctiva pink Ears- hearing intact Oropharynx- clear Lungs- Clear to ausculation bilaterally, normal work of breathing Chest- ICD pocket is well healed Heart- Regular rate and rhythm, no murmurs, rubs or gallops, PMI not laterally displaced GI- soft, NT, ND, +  BS Extremities- no clubbing, cyanosis, or edema  ICD interrogation- reviewed in detail today,  See PACEART report  Assessment and Plan:  1. Ischemic CM/ chronic systolic dysfunction Normal BiV ICD function See Pace Art report No changes today Merlin every 3 months Return to the device clinic in 9 months euvolemic today May benefit from repeat echo in 6-9 months to reassess EF with CRT

## 2013-03-12 NOTE — Patient Instructions (Addendum)
Remote monitoring is used to monitor your Pacemaker of ICD from home. This monitoring reduces the number of office visits required to check your device to one time per year. It allows [place] to keep an eye on the functioning of your device to ensure it is working properly. You are scheduled for a device check from home on June 15, 2013. You may send your transmission at any time that day. If you have a wireless device, the transmission will be sent automatically. After your physician reviews your transmission, you will receive a postcard with your next transmission date.  [name] - 9 months

## 2013-03-25 ENCOUNTER — Encounter: Payer: Self-pay | Admitting: Internal Medicine

## 2013-04-14 ENCOUNTER — Telehealth: Payer: Self-pay | Admitting: Internal Medicine

## 2013-04-14 NOTE — Telephone Encounter (Signed)
Spoke w/pt and pt was suppose to get cell adapter for [employer]. Nothing has been sent to pt. Will followup with [place] to see about getting pt cell adapter sent. Pt aware of this.

## 2013-04-14 NOTE — Telephone Encounter (Signed)
PATIENT called because he has not received special equipment for his land line phone

## 2013-04-16 ENCOUNTER — Other Ambulatory Visit: Payer: Self-pay | Admitting: Cardiovascular Disease

## 2013-04-16 LAB — HEPATIC FUNCTION PANEL
ALT: 18 U/L (ref 0–53)
AST: 22 U/L (ref 0–37)
Bilirubin, Direct: 0.1 mg/dL (ref 0.0–0.3)

## 2013-04-16 LAB — LIPID PANEL
Cholesterol: 155 mg/dL (ref 0–200)
HDL: 26 mg/dL — ABNORMAL LOW (ref >39)
Total CHOL/HDL Ratio: 6 Ratio
Triglycerides: 212 mg/dL — ABNORMAL HIGH (ref <150)

## 2013-04-17 LAB — LIPID PANEL: LDL Cholesterol: 88 mg/dL (ref 0–99)

## 2013-04-17 LAB — HEPATIC FUNCTION PANEL
AST: 22 U/L (ref 0–37)
Alkaline Phosphatase: 72 U/L (ref 39–117)
Indirect Bilirubin: 0.5 mg/dL (ref 0.0–0.9)
Total Bilirubin: 0.6 mg/dL (ref 0.3–1.2)

## 2013-04-27 ENCOUNTER — Encounter: Payer: Self-pay | Admitting: *Deleted

## 2013-04-27 ENCOUNTER — Other Ambulatory Visit: Payer: Self-pay | Admitting: Cardiovascular Disease

## 2013-04-28 ENCOUNTER — Ambulatory Visit (HOSPITAL_COMMUNITY)
Admission: RE | Admit: 2013-04-28 | Discharge: 2013-04-28 | Disposition: A | Discharge status: Home / Self Care | Payer: BC Managed Care – PPO | Source: Ambulatory Visit | Attending: Cardiology | Admitting: Cardiology

## 2013-04-28 ENCOUNTER — Ambulatory Visit (HOSPITAL_BASED_OUTPATIENT_CLINIC_OR_DEPARTMENT_OTHER)
Admission: RE | Admit: 2013-04-28 | Discharge: 2013-04-28 | Disposition: A | Discharge status: Home / Self Care | Payer: BC Managed Care – PPO | Source: Ambulatory Visit | Attending: Cardiology | Admitting: Cardiology

## 2013-04-28 ENCOUNTER — Ambulatory Visit (HOSPITAL_BASED_OUTPATIENT_CLINIC_OR_DEPARTMENT_OTHER)
Admission: RE | Admit: 2013-04-28 | Discharge: 2013-04-28 | Disposition: A | Discharge status: Home / Self Care | Payer: BC Managed Care – PPO | Source: Ambulatory Visit | Attending: Cardiovascular Disease | Admitting: Cardiovascular Disease

## 2013-04-28 DIAGNOSIS — I739 Peripheral vascular disease, unspecified: Secondary | ICD-10-CM

## 2013-04-28 DIAGNOSIS — I10 Essential (primary) hypertension: Secondary | ICD-10-CM

## 2013-04-28 DIAGNOSIS — I6529 Occlusion and stenosis of unspecified carotid artery: Secondary | ICD-10-CM

## 2013-04-28 DIAGNOSIS — I701 Atherosclerosis of renal artery: Secondary | ICD-10-CM

## 2013-04-28 DIAGNOSIS — I70219 Atherosclerosis of native arteries of extremities with intermittent claudication, unspecified extremity: Secondary | ICD-10-CM

## 2013-04-28 NOTE — Progress Notes (Signed)
Lower Ext. Arterial Duplex Completed. Jeromiah Ohalloran, RDMS, RVT  

## 2013-04-28 NOTE — Progress Notes (Signed)
Renal Duplex Completed. Florice Hindle, RDMS, RVT  

## 2013-04-28 NOTE — Progress Notes (Signed)
Carotid Duplex Completed. Lucian Baswell, RDMS, RVT  

## 2013-05-07 ENCOUNTER — Telehealth: Payer: Self-pay | Admitting: *Deleted

## 2013-05-07 DIAGNOSIS — I6529 Occlusion and stenosis of unspecified carotid artery: Secondary | ICD-10-CM

## 2013-05-07 NOTE — Telephone Encounter (Signed)
Message copied by Marella Bile on Thu May 07, 2013  1:22 PM ------      Message from: Runell Gess      Created: Wed May 06, 2013  7:43 AM       No change from prior study. Repeat in 12 months. ------

## 2013-05-27 ENCOUNTER — Encounter: Payer: Self-pay | Admitting: Cardiovascular Disease

## 2013-05-27 ENCOUNTER — Ambulatory Visit (INDEPENDENT_AMBULATORY_CARE_PROVIDER_SITE_OTHER): Payer: BC Managed Care – PPO | Admitting: Cardiovascular Disease

## 2013-05-27 VITALS — BP 168/90 | HR 66 | Ht 70.5 in | Wt 170.4 lb

## 2013-05-27 DIAGNOSIS — I6522 Occlusion and stenosis of left carotid artery: Secondary | ICD-10-CM

## 2013-05-27 DIAGNOSIS — I6529 Occlusion and stenosis of unspecified carotid artery: Secondary | ICD-10-CM

## 2013-05-27 DIAGNOSIS — I739 Peripheral vascular disease, unspecified: Secondary | ICD-10-CM

## 2013-05-27 DIAGNOSIS — E785 Hyperlipidemia, unspecified: Secondary | ICD-10-CM

## 2013-05-27 NOTE — Assessment & Plan Note (Signed)
Status post carotid artery stenting I. Myself 157/14 for a high-grade asymptomatic left internal carotid artery stenosis under the "sapphire protocol" for high-risk asymptomatic disease. Followup Dopplers recently performed revealed the stent to be widely patent.

## 2013-05-27 NOTE — Assessment & Plan Note (Signed)
Status post witnessed cardiac arrest with CPR, urgent cath and bypass grafting for left main three-vessel disease and severe LV dysfunction. Dr. Kathlee Nations Trigt  placed a LIMA graft to the LAD, a vein to the PDA and sequential vein to OM1 and OM 2. He needs full neurologic recovery. He wore a lifetest and ultimately had a ICD placed by Dr. Hillis Range (by the ICD) 11/25/12 resulting in marked improvement regarding exercise tolerance. Myoview performed in December of last year revealed inferior scar without ischemia. He denies chest pain or shortness of breath.

## 2013-05-27 NOTE — Assessment & Plan Note (Signed)
On statin therapy and recently begun on fenofibrate. This patient lipid profile performed 04/16/13 revealed a total cholesterol 155, LDL of 87 and HDL of 26

## 2013-05-27 NOTE — Assessment & Plan Note (Signed)
Recent renal Doppler suggest progression of disease on the right. The patient is hypertensive. His renal aortic ratio is 6.26, moderately elevated compared to 6 months ago. We will recheck renal Dopplers in 6 months and if this shows continued progression I discussed the possibility of performing renal artery stenting for renal preservation.

## 2013-05-27 NOTE — Progress Notes (Signed)
05/27/2013 Jonathon Ryan   1956-04-02  [id number]  Primary Physician Josue Hector, MD Primary Cardiologist: Runell Gess MD [name]   HPI:  The patient is a 57 year old, thin-appearing, married Caucasian male with no children who I last saw in the office September 26, 2012. He had witnessed cardiac arrest with bystander CPR and defibrillation, arctic sun who presented for emergent cardiac catheterization by myself revealing left main 3-vessel disease and he ultimately underwent emergency coronary artery bypass grafting by Dr. Kathlee Nations Trigt with LIMA to his LAD, vein to a PDA, sequential vein to OM1 and 2. His EF was 25% to 30%. He made a full neurologic recovery. He is wearing a LifeVest. His other problems include hypertension and hyperlipidemia and discontinued tobacco abuse which he stopped June 21, 2012. He also has peripheral vascular occlusive disease with high-grade left internal carotid artery stenosis followed by duplex ultrasound, high-grade right renal artery stenosis and right iliac stenosis though he denies claudication. We have gotten Dopplers on both his renals and his lower extremities. He had a Myoview stress test that showed an inferior scar without ischemia with an EF in the 25%-30% range confirmed by 2D echo. I performed carotid stenting on him under the "sapphire protocol" for high risk asymptomatic patients on October 14, 2012, with an excellent result. Followup Dopplers of his carotidis performed on October 28, 2012, were normal. Dr. Hillis Range performed implantation of a BiV ICD on November 25, 2012, which has resulted in marked improvement in him clinically with regards to exercise tolerance. His last lipid profile performed7/10/14 revealed a total cholesterol 155, LDL of 87 and Ticlid because her level up to 12. This is markedly improved after the addition of Fenfibrate. He denies chest pain or shortness of breath. [name] follows his IV  ICD. Recent Dopplers revealed a patent left carotid stent, stable iliac disease with normal ABIs patient denies claudication. His renal Dopplers however to suggest progression of his known right renal artery stenosis.     Current Outpatient Prescriptions  Medication Sig Dispense Refill  . aspirin 81 MG tablet Take 81 mg by mouth daily.      [name] atorvastatin (LIPITOR) 40 MG tablet Take 40 mg by mouth every evening.      . carvedilol (COREG) 25 MG tablet Take 0.5 tablets (12.5 mg total) by mouth 2 (two) times daily.      . clopidogrel (PLAVIX) 75 MG tablet TAKE 1 TABLET BY MOUTH EVERY DAY WITH FOOD  30 tablet  6  . esomeprazole (NEXIUM) 40 MG capsule Take 40 mg by mouth daily before breakfast.      . fenofibrate (TRICOR) 145 MG tablet Take 1 tablet (145 mg total) by mouth daily.  30 tablet  11  . lisinopril (PRINIVIL,ZESTRIL) 10 MG tablet Take 5 mg by mouth daily.      [name] LORazepam (ATIVAN) 0.5 MG tablet Take 0.5 mg by mouth every 8 (eight) hours as needed for anxiety. Anxiety       No current facility-administered medications for this visit.    No Known Allergies  History   Social History  . Marital Status: Married    Spouse Name: [name]    Number of Children: [name]  . Years of Education: [name]   Occupational History  . Not on file.   Social History Main Topics  . Smoking status: Former Smoker -- 1.50 packs/day for 40 years    Types: Cigarettes    Start date: 06/11/2012  .  Smokeless tobacco: Never Used  . Alcohol Use: 3.6 - 4.8 oz/week    6-8 Cans of beer per week     Comment: occasional  . Drug Use: No  . Sexual Activity: Yes   Other Topics Concern  . Not on file   Social History Narrative  . No narrative on file     Review of Systems: General: negative for chills, fever, night sweats or weight changes.  Cardiovascular: negative for chest pain, dyspnea on exertion, edema, orthopnea, palpitations, paroxysmal nocturnal dyspnea or shortness of breath Dermatological: negative  for rash Respiratory: negative for cough or wheezing Urologic: negative for hematuria Abdominal: negative for nausea, vomiting, diarrhea, bright red blood per rectum, melena, or hematemesis Neurologic: negative for visual changes, syncope, or dizziness All other systems reviewed and are otherwise negative except as noted above.    Blood pressure 168/90, pulse 66, height 5' 10.5" (1.791 m), weight 170 lb 6.4 oz (77.293 kg).  General appearance: alert and no distress Neck: no adenopathy, no JVD, supple, symmetrical, trachea midline, thyroid not enlarged, symmetric, no tenderness/mass/nodules and soft left and right carotid bruit Lungs: clear to auscultation bilaterally Heart: regular rate and rhythm, S1, S2 normal, no murmur, click, rub or gallop Extremities: extremities normal, atraumatic, no cyanosis or edema  EKG not performed today  ASSESSMENT AND PLAN:   Ischemic cardiomyopathy, EF 25-30% by echo 06/17/12- pt is on Life Vest Status post witnessed cardiac arrest with CPR, urgent cath and bypass grafting for left main three-vessel disease and severe LV dysfunction. Dr. Kathlee Nations Trigt  placed a LIMA graft to the LAD, a vein to the PDA and sequential vein to OM1 and OM 2. He needs full neurologic recovery. He wore a lifetest and ultimately had a ICD placed by Dr. Hillis Range (by the ICD) 11/25/12 resulting in marked improvement regarding exercise tolerance. Myoview performed in December of last year revealed inferior scar without ischemia. He denies chest pain or shortness of breath.  Carotid artery stenosis, LICA stent 10/14/12 Status post carotid artery stenting I. Myself 157/14 for a high-grade asymptomatic left internal carotid artery stenosis under the "sapphire protocol" for high-risk asymptomatic disease. Followup Dopplers recently performed revealed the stent to be widely patent.  HLD (hyperlipidemia) On statin therapy and recently begun on fenofibrate. This patient lipid profile  performed 04/16/13 revealed a total cholesterol 155, LDL of 87 and HDL of 26  PVD, 90% Rt RAS, 80% Rt Iliac at cath Recent renal Doppler suggest progression of disease on the right. The patient is hypertensive. His renal aortic ratio is 6.26, moderately elevated compared to 6 months ago. We will recheck renal Dopplers in 6 months and if this shows continued progression I discussed the possibility of performing renal artery stenting for renal preservation.      Runell Gess MD FACP,[name],[name], [facility] 05/27/2013 3:48 PM

## 2013-05-27 NOTE — Patient Instructions (Addendum)
  We will see you back in follow up in January after your doppler results  Dr [name] has ordered renal dopplers and lower extremity arterial dopplers to be done in January.  Carotid dopplers to be done in July 2015

## 2013-06-15 ENCOUNTER — Encounter: Payer: BC Managed Care – PPO | Admitting: *Deleted

## 2013-06-16 ENCOUNTER — Encounter: Payer: Self-pay | Admitting: *Deleted

## 2013-06-18 ENCOUNTER — Encounter: Payer: Self-pay | Admitting: *Deleted

## 2013-06-18 ENCOUNTER — Telehealth: Payer: Self-pay | Admitting: Internal Medicine

## 2013-06-18 NOTE — Telephone Encounter (Signed)
Need to discuss letter received about no show for transmission.  Have not received the [employer]  port for my computer.

## 2013-06-18 NOTE — Telephone Encounter (Signed)
Pt still has not received his cell adapter from industry.  I will contact Bryan/SJM and f/u w/ pt.

## 2013-06-19 NOTE — Telephone Encounter (Signed)
Sent Bryan/SJM Mr. Denker info so Judie Grieve can f/u, Judie Grieve confirmed he received it.  Called Mr. Muchow to update him---LMOVM.

## 2013-07-26 ENCOUNTER — Other Ambulatory Visit: Payer: Self-pay | Admitting: Cardiovascular Disease

## 2013-07-27 NOTE — Telephone Encounter (Signed)
Rx was sent to pharmacy electronically. 

## 2013-09-29 ENCOUNTER — Other Ambulatory Visit: Payer: Self-pay | Admitting: Cardiology

## 2013-09-29 NOTE — Telephone Encounter (Signed)
Rx was sent to pharmacy electronically. 

## 2013-10-06 ENCOUNTER — Encounter (HOSPITAL_COMMUNITY): Payer: Self-pay | Admitting: *Deleted

## 2013-10-15 ENCOUNTER — Ambulatory Visit (HOSPITAL_COMMUNITY)
Admission: RE | Admit: 2013-10-15 | Discharge: 2013-10-15 | Disposition: A | Discharge status: Home / Self Care | Payer: BC Managed Care – PPO | Source: Ambulatory Visit | Attending: Cardiovascular Disease | Admitting: Cardiovascular Disease

## 2013-10-15 DIAGNOSIS — I739 Peripheral vascular disease, unspecified: Secondary | ICD-10-CM

## 2013-10-15 DIAGNOSIS — I10 Essential (primary) hypertension: Secondary | ICD-10-CM

## 2013-10-15 DIAGNOSIS — I701 Atherosclerosis of renal artery: Secondary | ICD-10-CM

## 2013-10-15 NOTE — Progress Notes (Signed)
Renal Duplex Completed. Meta Kroenke, BS, RDMS, RVT  

## 2013-10-21 ENCOUNTER — Encounter: Payer: Self-pay | Admitting: *Deleted

## 2013-10-21 ENCOUNTER — Telehealth: Payer: Self-pay | Admitting: *Deleted

## 2013-10-21 DIAGNOSIS — I701 Atherosclerosis of renal artery: Secondary | ICD-10-CM

## 2013-10-21 NOTE — Telephone Encounter (Signed)
Message copied by Marella BileVOGEL, Hildred Pharo W. on Wed Oct 21, 2013  1:04 PM ------      Message from: Runell GessBERRY, JONATHAN J      Created: Wed Oct 21, 2013  9:53 AM       No change from prior study. Repeat in 6 months ------

## 2013-10-21 NOTE — Telephone Encounter (Signed)
Order placed for repeat dopplers in 6 months 

## 2013-10-22 ENCOUNTER — Ambulatory Visit (HOSPITAL_COMMUNITY)
Admission: RE | Admit: 2013-10-22 | Discharge: 2013-10-22 | Disposition: A | Discharge status: Home / Self Care | Payer: BC Managed Care – PPO | Source: Ambulatory Visit | Attending: Internal Medicine | Admitting: Internal Medicine

## 2013-10-22 DIAGNOSIS — I739 Peripheral vascular disease, unspecified: Secondary | ICD-10-CM

## 2013-10-22 NOTE — Progress Notes (Signed)
Lower Extremity Arterial Duplex Completed. °Brianna L Mazza,RVT °

## 2013-10-27 ENCOUNTER — Telehealth: Payer: Self-pay | Admitting: *Deleted

## 2013-10-27 ENCOUNTER — Encounter: Payer: Self-pay | Admitting: *Deleted

## 2013-10-27 DIAGNOSIS — I739 Peripheral vascular disease, unspecified: Secondary | ICD-10-CM

## 2013-10-27 NOTE — Telephone Encounter (Signed)
Order placed for repeat lower ext dopplers in 1 year 

## 2013-10-27 NOTE — Telephone Encounter (Signed)
Message copied by Marella BileVOGEL, Larayne Baxley W. on Tue Oct 27, 2013 10:28 AM ------      Message from: Runell GessBERRY, JONATHAN J      Created: Sun Oct 25, 2013  4:47 PM       No change from prior study. Repeat in 12 months. ------

## 2013-11-18 ENCOUNTER — Other Ambulatory Visit: Payer: Self-pay | Admitting: Cardiovascular Disease

## 2013-11-18 NOTE — Telephone Encounter (Signed)
Rx was sent to pharmacy electronically. 

## 2013-12-15 ENCOUNTER — Other Ambulatory Visit: Payer: Self-pay | Admitting: Cardiovascular Disease

## 2013-12-16 NOTE — Telephone Encounter (Signed)
Rx was sent to pharmacy electronically. 

## 2013-12-30 ENCOUNTER — Ambulatory Visit (INDEPENDENT_AMBULATORY_CARE_PROVIDER_SITE_OTHER): Payer: BC Managed Care – PPO | Admitting: Internal Medicine

## 2013-12-30 ENCOUNTER — Encounter: Payer: Self-pay | Admitting: Internal Medicine

## 2013-12-30 VITALS — BP 171/69 | HR 63 | Ht 70.0 in | Wt 181.0 lb

## 2013-12-30 DIAGNOSIS — Z8674 Personal history of sudden cardiac arrest: Secondary | ICD-10-CM

## 2013-12-30 DIAGNOSIS — I10 Essential (primary) hypertension: Secondary | ICD-10-CM

## 2013-12-30 DIAGNOSIS — I255 Ischemic cardiomyopathy: Secondary | ICD-10-CM

## 2013-12-30 DIAGNOSIS — I2589 Other forms of chronic ischemic heart disease: Secondary | ICD-10-CM

## 2013-12-30 LAB — MDC_IDC_ENUM_SESS_TYPE_INCLINIC
Battery Remaining Longevity: 68.4 mo
Brady Statistic RA Percent Paced: 29 %
Brady Statistic RV Percent Paced: 97 %
HIGH POWER IMPEDANCE MEASURED VALUE: 65.25 Ohm
Implantable Pulse Generator Serial Number: 7077567
Lead Channel Impedance Value: 450 Ohm
Lead Channel Pacing Threshold Amplitude: 0.625 V
Lead Channel Pacing Threshold Amplitude: 0.75 V
Lead Channel Pacing Threshold Pulse Width: 0.5 ms
Lead Channel Pacing Threshold Pulse Width: 0.5 ms
Lead Channel Setting Pacing Amplitude: 2.5 V
Lead Channel Setting Pacing Pulse Width: 0.5 ms
Lead Channel Setting Sensing Sensitivity: 0.5 mV
MDC IDC MSMT LEADCHNL LV IMPEDANCE VALUE: 412.5 Ohm
MDC IDC MSMT LEADCHNL RA SENSING INTR AMPL: 5 mV
MDC IDC MSMT LEADCHNL RV IMPEDANCE VALUE: 387.5 Ohm
MDC IDC MSMT LEADCHNL RV PACING THRESHOLD AMPLITUDE: 1 V
MDC IDC MSMT LEADCHNL RV PACING THRESHOLD PULSEWIDTH: 0.5 ms
MDC IDC MSMT LEADCHNL RV SENSING INTR AMPL: 12 mV
MDC IDC SESS DTM: 20150325171314
MDC IDC SET LEADCHNL LV PACING AMPLITUDE: 2 V
MDC IDC SET LEADCHNL LV PACING PULSEWIDTH: 0.5 ms
MDC IDC SET LEADCHNL RA PACING AMPLITUDE: 1.75 V
MDC IDC SET ZONE DETECTION INTERVAL: 300 ms
Zone Setting Detection Interval: 340 ms

## 2013-12-30 NOTE — Patient Instructions (Signed)
Continue all current medications. Merlin remote 04/05/2014 Your physician wants you to follow up in:  1 year.  You will receive a reminder letter in the mail one-two months in advance.  If you don't receive a letter, please call our office to schedule the follow up appointment - DR. ALLRED

## 2013-12-30 NOTE — Progress Notes (Signed)
PCP: Josue Hector, MD Primary Cardiologist: Nanetta Batty, MD  Jonathon Ryan is a 58 y.o. male who presents today for routine electrophysiology followup.  Since last being seen in our clinic, the patient reports doing very well.  He reports improvement in functional capacity and less shortness of breath since implantation of CRTD.   Today, he denies symptoms of palpitations, chest pain, shortness of breath,  lower extremity edema, dizziness, presyncope, syncope, or ICD shocks.  The patient is otherwise without complaint today.   Past Medical History  Diagnosis Date  . Chronic systolic dysfunction of left ventricle   . Hyperlipidemia   . Cerebrovascular disease 10/15/2012  . Left bundle branch block   . Ischemic cardiomyopathy     s/p BiV ICD implant Feb 2014 - [address] [name] [place], [place] 16[address]RU-04 (serial # [id number]) right atrial lead and a [name] [place], model 5409-81X (serial number [id number]) right ventricular defibrillator lead. [name] Quartet model [phone] - 86 (serial number [id number]) LV lead. [name] Quadra Assura model [place] 2956 [phone] (serial Number [id number]) biventricular ICD.   [name] Cardiac arrest due to underlying cardiac condition     successfully rescucitated/ cooled  . CAD (coronary artery disease) 9/13    s/p CABG  . Headache(784.0)   . Renal artery stenosis   . Peripheral arterial disease   . Hypertension   . Cardiac defibrillator in place    Past Surgical History  Procedure Laterality Date  . Coronary artery bypass graft  06/11/2012    Procedure: CORONARY ARTERY BYPASS GRAFTING (CABG);  Surgeon: [name], MD;  Location: [facility];  Service: Open Heart Surgery;  Laterality: [name];  Coronary Artery Bypass Grafting times four using left internal mammary artery and right greater saphenous vein endoscopically harvested.  [name] Percutaneous placement intravascular stent cervical carotid artery      Current Outpatient Prescriptions  Medication  Sig Dispense Refill  . aspirin 81 MG tablet Take 81 mg by mouth daily.      [name] atorvastatin (LIPITOR) 40 MG tablet TAKE 1 TABLET BY MOUTH AT BEDTIME  30 tablet  10  . carvedilol (COREG) 25 MG tablet Take 0.5 tablets (12.5 mg total) by mouth 2 (two) times daily with a meal.  30 tablet  8  . clopidogrel (PLAVIX) 75 MG tablet TAKE 1 TABLET BY MOUTH EVERY DAY WITH FOOD  30 tablet  7  . fenofibrate (TRICOR) 145 MG tablet Take 1 tablet (145 mg total) by mouth daily.  30 tablet  11  . lisinopril (PRINIVIL,ZESTRIL) 10 MG tablet Take 0.5 tablets (5 mg total) by mouth daily.  30 tablet  2  . LORazepam (ATIVAN) 0.5 MG tablet Take 0.5 mg by mouth every 8 (eight) hours as needed for anxiety. Anxiety      . omeprazole (PRILOSEC) 40 MG capsule Take 40 mg by mouth daily.        No current facility-administered medications for this visit.    Physical Exam: Filed Vitals:   12/30/13 1504  BP: 171/69  Pulse: 63  Height: 5\' 10"  (1.778 m)  Weight: 181 lb (82.101 kg)  SpO2: 97%    GEN- The patient is well appearing, alert and oriented x 3 today.   Head- normocephalic, atraumatic Eyes-  Sclera clear, conjunctiva pink Ears- hearing intact Oropharynx- clear Lungs- Clear to ausculation bilaterally, normal work of breathing Chest- ICD pocket is well healed, mild puffiness over the site but without tenderness erythema, warmth, drainage etc,  No  fevers/chills Heart- Regular rate and rhythm, no murmurs, rubs or gallops, PMI not laterally displaced GI- soft, NT, ND, + BS Extremities- no clubbing, cyanosis, or edema  ICD interrogation- reviewed in detail today,  See PACEART report  Assessment and Plan:  1. Ischemic CM/ chronic systolic dysfunction Normal BiV ICD function See Pace Art report No changes today Merlin every 3 months He is not yet interested in our ICM hemodynamic (Core[facility]/optivol) clinic euvolemic today   2. htn Above goal He has not taken medicines today Importance of compliance is  stressed today.  He will follow-up with Dr Allyson SabalBerry in 1 week.  Merlin Return to see me in 1 year

## 2014-01-06 ENCOUNTER — Ambulatory Visit (INDEPENDENT_AMBULATORY_CARE_PROVIDER_SITE_OTHER): Payer: BC Managed Care – PPO | Admitting: Cardiovascular Disease

## 2014-01-06 ENCOUNTER — Encounter: Payer: Self-pay | Admitting: Cardiovascular Disease

## 2014-01-06 VITALS — BP 144/100 | HR 61 | Ht 70.0 in | Wt 180.6 lb

## 2014-01-06 DIAGNOSIS — Z95820 Peripheral vascular angioplasty status with implants and grafts: Secondary | ICD-10-CM

## 2014-01-06 DIAGNOSIS — I10 Essential (primary) hypertension: Secondary | ICD-10-CM

## 2014-01-06 DIAGNOSIS — E785 Hyperlipidemia, unspecified: Secondary | ICD-10-CM

## 2014-01-06 DIAGNOSIS — Z79899 Other long term (current) drug therapy: Secondary | ICD-10-CM

## 2014-01-06 DIAGNOSIS — Z9889 Other specified postprocedural states: Secondary | ICD-10-CM

## 2014-01-06 DIAGNOSIS — I255 Ischemic cardiomyopathy: Secondary | ICD-10-CM

## 2014-01-06 DIAGNOSIS — I739 Peripheral vascular disease, unspecified: Secondary | ICD-10-CM

## 2014-01-06 DIAGNOSIS — I2510 Atherosclerotic heart disease of native coronary artery without angina pectoris: Secondary | ICD-10-CM

## 2014-01-06 DIAGNOSIS — I2589 Other forms of chronic ischemic heart disease: Secondary | ICD-10-CM

## 2014-01-06 LAB — LIPID PANEL
CHOL/HDL RATIO: 6 ratio
CHOLESTEROL: 151 mg/dL (ref 0–200)
HDL: 25 mg/dL — AB (ref >39)
LDL Cholesterol: 55 mg/dL (ref 0–99)
TRIGLYCERIDES: 354 mg/dL — AB (ref <150)
VLDL: 71 mg/dL — AB (ref 0–40)

## 2014-01-06 LAB — HEPATIC FUNCTION PANEL
ALBUMIN: 4.8 g/dL (ref 3.5–5.2)
ALT: 38 U/L (ref 0–53)
AST: 31 U/L (ref 0–37)
Alkaline Phosphatase: 135 U/L — ABNORMAL HIGH (ref 39–117)
BILIRUBIN DIRECT: 0.1 mg/dL (ref 0.0–0.3)
Indirect Bilirubin: 0.7 mg/dL (ref 0.2–1.2)
TOTAL PROTEIN: 8.1 g/dL (ref 6.0–8.3)
Total Bilirubin: 0.8 mg/dL (ref 0.2–1.2)

## 2014-01-06 MED ORDER — LISINOPRIL 20 MG PO TABS: 20.0000 mg | ORAL_TABLET | Freq: Every day | ORAL | Status: DC

## 2014-01-06 NOTE — Assessment & Plan Note (Signed)
Moderately elevated on appropriate medications. He doesn't to dietary indiscretion regarding swallow to help with them on. I did increase his lisinopril from 10-20 mg a day

## 2014-01-06 NOTE — Progress Notes (Signed)
01/06/2014 KAGE WILLMANN   1956/08/23  [id number]  Primary Physician Josue Hector, MD Primary Cardiologist: Runell Gess MD [name]   HPI:  The patient is a 58 year old, thin-appearing, married Caucasian male with no children who I last saw in the office September 26, 2012. He had witnessed cardiac arrest with bystander CPR and defibrillation, arctic sun who presented for emergent cardiac catheterization by myself revealing left main 3-vessel disease and he ultimately underwent emergency coronary artery bypass grafting by Dr. Kathlee Nations Trigt with LIMA to his LAD, vein to a PDA, sequential vein to OM1 and 2. His EF was 25% to 30%. He made a full neurologic recovery. He is wearing a LifeVest. His other problems include hypertension and hyperlipidemia and discontinued tobacco abuse which he stopped June 21, 2012. He also has peripheral vascular occlusive disease with high-grade left internal carotid artery stenosis followed by duplex ultrasound, high-grade right renal artery stenosis and right iliac stenosis though he denies claudication. We have gotten Dopplers on both his renals and his lower extremities. He had a Myoview stress test that showed an inferior scar without ischemia with an EF in the 25%-30% range confirmed by 2D echo. I performed carotid stenting on him under the "sapphire protocol" for high risk asymptomatic patients on October 14, 2012, with an excellent result. Followup Dopplers of his carotidis performed on October 28, 2012, were normal. Dr. Hillis Range performed implantation of a BiV ICD on November 25, 2012, which has resulted in marked improvement in him clinically with regards to exercise tolerance. His last lipid profile performed7/10/14 revealed a total cholesterol 155, LDL of 87 and Ticlid because her level up to 12. This is markedly improved after the addition of Fenfibrate. He denies chest pain or shortness of breath. [name] follows his IV  ICD.  Carotid Dopplers performed last July revealed a patent left carotid stent. We will Dopplers have shown stable right renal artery stenosis with her vomiting ultrasound. He may ultimately need this intervened on given his hypertension..     Current Outpatient Prescriptions  Medication Sig Dispense Refill  . aspirin 81 MG tablet Take 81 mg by mouth daily.      [name] atorvastatin (LIPITOR) 40 MG tablet TAKE 1 TABLET BY MOUTH AT BEDTIME  30 tablet  10  . carvedilol (COREG) 25 MG tablet Take 0.5 tablets (12.5 mg total) by mouth 2 (two) times daily with a meal.  30 tablet  8  . clopidogrel (PLAVIX) 75 MG tablet TAKE 1 TABLET BY MOUTH EVERY DAY WITH FOOD  30 tablet  7  . fenofibrate (TRICOR) 145 MG tablet Take 1 tablet (145 mg total) by mouth daily.  30 tablet  11  . lisinopril (PRINIVIL,ZESTRIL) 20 MG tablet Take 1 tablet (20 mg total) by mouth daily.  30 tablet  6  . LORazepam (ATIVAN) 0.5 MG tablet Take 0.5 mg by mouth every 8 (eight) hours as needed for anxiety. Anxiety      . omeprazole (PRILOSEC) 40 MG capsule Take 40 mg by mouth daily.        No current facility-administered medications for this visit.    No Known Allergies  History   Social History  . Marital Status: Married    Spouse Name: [name]    Number of Children: [name]  . Years of Education: [name]   Occupational History  . Not on file.   Social History Main Topics  . Smoking status: Former Smoker -- 1.50 packs/day for  40 years    Types: Cigarettes    Quit date: 06/11/2012  . Smokeless tobacco: Never Used  . Alcohol Use: 3.6 - 4.8 oz/week    6-8 Cans of beer per week     Comment: occasional  . Drug Use: No  . Sexual Activity: Yes   Other Topics Concern  . Not on file   Social History Narrative  . No narrative on file     Review of Systems: General: negative for chills, fever, night sweats or weight changes.  Cardiovascular: negative for chest pain, dyspnea on exertion, edema, orthopnea, palpitations, paroxysmal  nocturnal dyspnea or shortness of breath Dermatological: negative for rash Respiratory: negative for cough or wheezing Urologic: negative for hematuria Abdominal: negative for nausea, vomiting, diarrhea, bright red blood per rectum, melena, or hematemesis Neurologic: negative for visual changes, syncope, or dizziness All other systems reviewed and are otherwise negative except as noted above.    Blood pressure 144/100, pulse 61, height 5\' 10"  (1.778 m), weight 180 lb 9.6 oz (81.92 kg).  General appearance: alert and no distress Neck: no adenopathy, no JVD, supple, symmetrical, trachea midline, thyroid not enlarged, symmetric, no tenderness/mass/nodules and right carotid bruit Lungs: clear to auscultation bilaterally Heart: regular rate and rhythm, S1, S2 normal, no murmur, click, rub or gallop Extremities: extremities normal, atraumatic, no cyanosis or edema  EKG ventricular paced rhythm  ASSESSMENT AND PLAN:   HLD (hyperlipidemia) On statin therapy. We will recheck a lipid and liver profile  Hypertension Moderately elevated on appropriate medications. He doesn't to dietary indiscretion regarding swallow to help with them on. I did increase his lisinopril from 10-20 mg a day  Ischemic cardiomyopathy, EF 25-30% by echo 06/17/12- pt is on Life Vest Status post coronary artery bypass grafting by Dr. Kathlee NationsPeter Van Trigt with a  LIMA to his LAD, vein to the PDA and sequential vein to OM1 and I don't feel. His last Myoview performed 09/12/12 revealed scar in the RCA territory.he does have a Bi- ICD followed by Dr. [name].  Status post angioplasty with stent, of High grade Lt ICA 10/14/12 Status post left carotid stenting by myself January 2014. We followed this by duplex ultrasound on an annual basis.  PVD, 90% Rt RAS, 80% Rt Iliac at cath He does have 80% right renal and right iliac stenosis. He denies claudication. We'll follow his renal Dopplers periodically and they have remained  stable.      Runell GessJonathan J. Shwanda Soltis MD FACP,[name],[name], [facility] 01/06/2014 12:54 PM

## 2014-01-06 NOTE — Assessment & Plan Note (Signed)
Status post coronary artery bypass grafting by Dr. Kathlee NationsPeter Van Trigt with a  LIMA to his LAD, vein to the PDA and sequential vein to OM1 and I don't feel. His last Myoview performed 09/12/12 revealed scar in the RCA territory.he does have a Bi- ICD followed by Dr. [name].

## 2014-01-06 NOTE — Patient Instructions (Signed)
  We will see you back in follow up in 6 months with an extender and 1 year with Dr Allyson SabalBerry  Dr Allyson SabalBerry has ordered : 1.  Echocardiogram. Echocardiography is a painless test that uses sound waves to create images of your heart. It provides your doctor with information about the size and shape of your heart and how well your heart's chambers and valves are working. This procedure takes approximately one hour. There are no restrictions for this procedure.   2. Increase Lisinopril to 20mg  daily  3. Have blood work done

## 2014-01-06 NOTE — Assessment & Plan Note (Signed)
On statin therapy. We will recheck a lipid and liver profile 

## 2014-01-06 NOTE — Assessment & Plan Note (Signed)
Status post left carotid stenting by myself January 2014. We followed this by duplex ultrasound on an annual basis.

## 2014-01-06 NOTE — Assessment & Plan Note (Signed)
He does have 80% right renal and right iliac stenosis. He denies claudication. We'll follow his renal Dopplers periodically and they have remained stable.

## 2014-01-13 ENCOUNTER — Ambulatory Visit (HOSPITAL_COMMUNITY)
Admission: RE | Admit: 2014-01-13 | Discharge: 2014-01-13 | Disposition: A | Discharge status: Home / Self Care | Payer: BC Managed Care – PPO | Source: Ambulatory Visit | Attending: Internal Medicine | Admitting: Internal Medicine

## 2014-01-13 DIAGNOSIS — I059 Rheumatic mitral valve disease, unspecified: Secondary | ICD-10-CM

## 2014-01-13 DIAGNOSIS — I10 Essential (primary) hypertension: Secondary | ICD-10-CM | POA: Insufficient documentation

## 2014-01-13 DIAGNOSIS — I2510 Atherosclerotic heart disease of native coronary artery without angina pectoris: Secondary | ICD-10-CM | POA: Insufficient documentation

## 2014-01-13 NOTE — Progress Notes (Signed)
2D Echo Performed 01/13/2014    Kayliee Atienza, RCS  

## 2014-01-18 ENCOUNTER — Encounter: Payer: Self-pay | Admitting: *Deleted

## 2014-03-02 ENCOUNTER — Other Ambulatory Visit: Payer: Self-pay | Admitting: *Deleted

## 2014-03-02 DIAGNOSIS — E785 Hyperlipidemia, unspecified: Secondary | ICD-10-CM

## 2014-03-02 MED ORDER — FENOFIBRATE 145 MG PO TABS: 145.0000 mg | ORAL_TABLET | Freq: Every day | ORAL | Status: DC

## 2014-03-02 NOTE — Telephone Encounter (Signed)
Rx refill sent to patient pharmacy   

## 2014-04-05 ENCOUNTER — Ambulatory Visit (INDEPENDENT_AMBULATORY_CARE_PROVIDER_SITE_OTHER): Payer: BC Managed Care – PPO | Admitting: *Deleted

## 2014-04-05 DIAGNOSIS — I255 Ischemic cardiomyopathy: Secondary | ICD-10-CM

## 2014-04-05 DIAGNOSIS — I2589 Other forms of chronic ischemic heart disease: Secondary | ICD-10-CM

## 2014-04-05 DIAGNOSIS — Z8674 Personal history of sudden cardiac arrest: Secondary | ICD-10-CM

## 2014-04-05 NOTE — Progress Notes (Signed)
Remote ICD transmission.   

## 2014-04-06 LAB — MDC_IDC_ENUM_SESS_TYPE_REMOTE
Battery Remaining Percentage: 81 %
Battery Voltage: 2.99 V
Brady Statistic AP VP Percent: 23 %
Brady Statistic AP VS Percent: 1 %
Brady Statistic AS VP Percent: 76 %
Date Time Interrogation Session: 20150629060036
HIGH POWER IMPEDANCE MEASURED VALUE: 70 Ohm
HighPow Impedance: 70 Ohm
Lead Channel Impedance Value: 380 Ohm
Lead Channel Impedance Value: 450 Ohm
Lead Channel Pacing Threshold Amplitude: 0.625 V
Lead Channel Pacing Threshold Amplitude: 0.625 V
Lead Channel Pacing Threshold Pulse Width: 0.5 ms
Lead Channel Pacing Threshold Pulse Width: 0.5 ms
Lead Channel Sensing Intrinsic Amplitude: 5 mV
Lead Channel Setting Pacing Amplitude: 1.625
Lead Channel Setting Pacing Amplitude: 2.5 V
Lead Channel Setting Pacing Pulse Width: 0.5 ms
MDC IDC MSMT BATTERY REMAINING LONGEVITY: 67 mo
MDC IDC MSMT LEADCHNL LV IMPEDANCE VALUE: 440 Ohm
MDC IDC MSMT LEADCHNL RA PACING THRESHOLD PULSEWIDTH: 0.5 ms
MDC IDC MSMT LEADCHNL RV PACING THRESHOLD AMPLITUDE: 1 V
MDC IDC MSMT LEADCHNL RV SENSING INTR AMPL: 12 mV
MDC IDC PG SERIAL: 7077567
MDC IDC SET LEADCHNL LV PACING AMPLITUDE: 2 V
MDC IDC SET LEADCHNL LV PACING PULSEWIDTH: 0.5 ms
MDC IDC SET LEADCHNL RV SENSING SENSITIVITY: 0.5 mV
MDC IDC SET ZONE DETECTION INTERVAL: 300 ms
MDC IDC SET ZONE DETECTION INTERVAL: 340 ms
MDC IDC STAT BRADY AS VS PERCENT: 1 %
MDC IDC STAT BRADY RA PERCENT PACED: 22 %

## 2014-04-14 ENCOUNTER — Encounter: Payer: Self-pay | Admitting: Cardiology

## 2014-04-19 ENCOUNTER — Encounter: Payer: Self-pay | Admitting: Internal Medicine

## 2014-04-26 ENCOUNTER — Ambulatory Visit (HOSPITAL_COMMUNITY)
Admission: RE | Admit: 2014-04-26 | Discharge: 2014-04-26 | Disposition: A | Discharge status: Home / Self Care | Payer: BC Managed Care – PPO | Source: Ambulatory Visit | Attending: Cardiovascular Disease | Admitting: Cardiovascular Disease

## 2014-04-26 ENCOUNTER — Ambulatory Visit (HOSPITAL_BASED_OUTPATIENT_CLINIC_OR_DEPARTMENT_OTHER)
Admission: RE | Admit: 2014-04-26 | Discharge: 2014-04-26 | Disposition: A | Discharge status: Home / Self Care | Payer: BC Managed Care – PPO | Source: Ambulatory Visit | Attending: Cardiovascular Disease | Admitting: Cardiovascular Disease

## 2014-04-26 DIAGNOSIS — I6529 Occlusion and stenosis of unspecified carotid artery: Secondary | ICD-10-CM | POA: Insufficient documentation

## 2014-04-26 DIAGNOSIS — I10 Essential (primary) hypertension: Secondary | ICD-10-CM

## 2014-04-26 DIAGNOSIS — I701 Atherosclerosis of renal artery: Secondary | ICD-10-CM

## 2014-04-26 NOTE — Progress Notes (Signed)
Renal Duplex Completed. James Senn, BS, RDMS, RVT  

## 2014-04-26 NOTE — Progress Notes (Signed)
Carotid Duplex Completed. Jonathon Ryan, BS, RDMS, RVT  

## 2014-04-30 ENCOUNTER — Encounter: Payer: Self-pay | Admitting: *Deleted

## 2014-06-28 ENCOUNTER — Other Ambulatory Visit: Payer: Self-pay | Admitting: Cardiovascular Disease

## 2014-06-28 NOTE — Telephone Encounter (Signed)
Rx was sent to pharmacy electronically. 

## 2014-07-05 ENCOUNTER — Encounter: Payer: BC Managed Care – PPO | Admitting: *Deleted

## 2014-07-05 ENCOUNTER — Telehealth: Payer: Self-pay | Admitting: Cardiology

## 2014-07-05 ENCOUNTER — Telehealth: Payer: Self-pay | Admitting: Internal Medicine

## 2014-07-05 ENCOUNTER — Encounter: Payer: Self-pay | Admitting: Internal Medicine

## 2014-07-05 ENCOUNTER — Ambulatory Visit (INDEPENDENT_AMBULATORY_CARE_PROVIDER_SITE_OTHER): Payer: BC Managed Care – PPO | Admitting: *Deleted

## 2014-07-05 DIAGNOSIS — I2589 Other forms of chronic ischemic heart disease: Secondary | ICD-10-CM

## 2014-07-05 DIAGNOSIS — I255 Ischemic cardiomyopathy: Secondary | ICD-10-CM

## 2014-07-05 NOTE — Telephone Encounter (Signed)
New message     Pt is trying to send a remote transmission and he is having trouble.  Please call

## 2014-07-05 NOTE — Telephone Encounter (Signed)
Spoke with pt and reminded pt of remote transmission that is due today. Pt verbalized understanding.   

## 2014-07-05 NOTE — Telephone Encounter (Signed)
80# given.

## 2014-07-07 ENCOUNTER — Encounter: Payer: Self-pay | Admitting: Physician Assistant

## 2014-07-07 ENCOUNTER — Ambulatory Visit (INDEPENDENT_AMBULATORY_CARE_PROVIDER_SITE_OTHER): Payer: BC Managed Care – PPO | Admitting: Physician Assistant

## 2014-07-07 VITALS — BP 104/68 | HR 64 | Ht 70.0 in | Wt 173.7 lb

## 2014-07-07 DIAGNOSIS — I6529 Occlusion and stenosis of unspecified carotid artery: Secondary | ICD-10-CM

## 2014-07-07 DIAGNOSIS — I658 Occlusion and stenosis of other precerebral arteries: Secondary | ICD-10-CM

## 2014-07-07 DIAGNOSIS — I6523 Occlusion and stenosis of bilateral carotid arteries: Secondary | ICD-10-CM

## 2014-07-07 DIAGNOSIS — Z8674 Personal history of sudden cardiac arrest: Secondary | ICD-10-CM

## 2014-07-07 DIAGNOSIS — I10 Essential (primary) hypertension: Secondary | ICD-10-CM

## 2014-07-07 DIAGNOSIS — Z720 Tobacco use: Secondary | ICD-10-CM

## 2014-07-07 DIAGNOSIS — I2510 Atherosclerotic heart disease of native coronary artery without angina pectoris: Secondary | ICD-10-CM

## 2014-07-07 DIAGNOSIS — I255 Ischemic cardiomyopathy: Secondary | ICD-10-CM

## 2014-07-07 DIAGNOSIS — I2589 Other forms of chronic ischemic heart disease: Secondary | ICD-10-CM

## 2014-07-07 DIAGNOSIS — F17200 Nicotine dependence, unspecified, uncomplicated: Secondary | ICD-10-CM

## 2014-07-07 DIAGNOSIS — E785 Hyperlipidemia, unspecified: Secondary | ICD-10-CM

## 2014-07-07 NOTE — Assessment & Plan Note (Signed)
Lipid Panel     Component Value Date/Time   CHOL 151 01/06/2014 1342   TRIG 354* 01/06/2014 1342   HDL 25* 01/06/2014 1342   CHOLHDL 6.0 01/06/2014 1342   VLDL 71* 01/06/2014 1342   LDLCALC 55 01/06/2014 1342    We discussed continued work on lowering carbohydrate intake. Increasing healthy fat intake including fish oil 2 g daily.

## 2014-07-07 NOTE — Assessment & Plan Note (Signed)
Blood pressure is well-controlled initially on the lower side. Lisinopril was increased to 20 mg at previous visit. No current changes.

## 2014-07-07 NOTE — Patient Instructions (Signed)
1.  Flax seed, Chia seed.  Low sugar/carb diet.  Higher healthy fats. 2.  Follow up with Dr. Allyson SabalBerry in 6 months.

## 2014-07-07 NOTE — Assessment & Plan Note (Signed)
Patient reported a very infrequent chest tightness. It resolved spontaneously. He likened it more indigestion

## 2014-07-07 NOTE — Assessment & Plan Note (Signed)
Patient reports no changes since his last office visit. He reports doing "okay". He appears euvolemic as a good job watching his salt intake.

## 2014-07-07 NOTE — Progress Notes (Signed)
Date:  07/07/2014   ID:  Jonathon Ryan, DOB 07-12-1956, MRN [id number]  PCP:  Josue Hector, MD  Primary Cardiologist:  [name] EP: Allred   History of Present Illness: Jonathon Ryan is a 58 y.o.  married Caucasian male with no children. He had witnessed cardiac arrest with bystander CPR and defibrillation, arctic sun who presented for emergent cardiac catheterization by Dr. [name] revealing left main 3-vessel disease and he ultimately underwent emergency coronary artery bypass grafting by Dr. Kathlee Nations Trigt with LIMA to his LAD, vein to a PDA, sequential vein to OM1 and 2. His EF was 25% to 30%. He made a full neurologic recovery. ICD implant this is followed by [name]. His other problems include hypertension and hyperlipidemia and discontinued tobacco abuse which he stopped June 21, 2012. He also has peripheral vascular occlusive disease with high-grade left internal carotid artery stenosis followed by duplex ultrasound, high-grade right renal artery stenosis and right iliac stenosis though he denies claudication. We have gotten Dopplers on both his renals and his lower extremities. his last carotid Dopplers were July 2015 it showed no progression of disease.. Renal Dopplers at the same time showed 60-99% proximal repeat right renal artery stenosis and 0-59% left renal artery stenosis. He had a Myoview stress test that showed an inferior scar without ischemia with an EF in the 25%-30% range confirmed by 2D echo.  Dr [name] performed carotid stenting on him under the "sapphire protocol" for high risk asymptomatic patients on October 14, 2012, with an excellent result.  Dr. Hillis Range performed implantation of a BiV ICD on November 25, 2012, which has resulted in marked improvement in him clinically with regards to exercise tolerance.  Dopplers have shown stable right renal artery stenosis. He may ultimately need this intervened on given his hypertension.[name]  His last lipid profile Lipid  Panel     Component Value Date/Time   CHOL 151 01/06/2014 1342   TRIG 354* 01/06/2014 1342   HDL 25* 01/06/2014 1342   CHOLHDL 6.0 01/06/2014 1342   VLDL 71* 01/06/2014 1342   LDLCALC 55 01/06/2014 1342   Patient presents today for 6 month evaluation he reports doing okay with no significant changes. Reports an episode or 2 of mild chest tightness which makes could just be indigestion. He hits golf balls on a regular basis and gets exercise by walking and retrieving them.   The patient currently denies nausea, vomiting, fever, shortness of breath, orthopnea, dizziness, PND, cough, congestion, abdominal pain, hematochezia, melena, lower extremity edema, claudication.  Wt Readings from Last 3 Encounters:  07/07/14 173 lb 11.2 oz (78.79 kg)  01/06/14 180 lb 9.6 oz (81.92 kg)  12/30/13 181 lb (82.101 kg)     Past Medical History  Diagnosis Date  . Chronic systolic dysfunction of left ventricle   . Hyperlipidemia   . Cerebrovascular disease 10/15/2012  . Left bundle branch block   . Ischemic cardiomyopathy     s/p BiV ICD implant Feb 2014 - [address] [name] [place], [place] [address]JX-91 (serial # [id number]) right atrial lead and a [name] [place], model 4782-95A (serial number [id number]) right ventricular defibrillator lead. [name] Quartet model [phone] [phone] (serial number [id number]) LV lead. [name] Quadra Assura model [place] 6578 [phone] (serial Number [id number]) biventricular ICD.   [name] Cardiac arrest due to underlying cardiac condition     successfully rescucitated/ cooled  . CAD (coronary artery disease) 9/13    s/p CABG  .  Headache(784.0)   . Renal artery stenosis   . Peripheral arterial disease   . Hypertension   . Cardiac defibrillator in place     Current Outpatient Prescriptions  Medication Sig Dispense Refill  . aspirin 81 MG tablet Take 81 mg by mouth daily.      [name] atorvastatin (LIPITOR) 40 MG tablet TAKE 1 TABLET BY MOUTH AT BEDTIME  30 tablet  7  . carvedilol (COREG) 25  MG tablet Take 0.5 tablets (12.5 mg total) by mouth 2 (two) times daily with a meal.  30 tablet  8  . clopidogrel (PLAVIX) 75 MG tablet TAKE 1 TABLET BY MOUTH EVERY DAY WITH FOOD  30 tablet  7  . fenofibrate (TRICOR) 145 MG tablet Take 1 tablet (145 mg total) by mouth daily.  30 tablet  5  . lisinopril (PRINIVIL,ZESTRIL) 20 MG tablet Take 10 mg by mouth daily.      [name] LORazepam (ATIVAN) 0.5 MG tablet Take 0.5 mg by mouth every 8 (eight) hours as needed for anxiety. Anxiety      . omeprazole (PRILOSEC) 40 MG capsule Take 40 mg by mouth daily.        No current facility-administered medications for this visit.    Allergies:   No Known Allergies  Social History:  The patient  reports that he quit smoking about 2 years ago. His smoking use included Cigarettes. He has a 60 pack-year smoking history. He has never used smokeless tobacco. He reports that he drinks about 3.6 - 4.8 ounces of alcohol per week. He reports that he does not use illicit drugs.   Family history:   Family History  Problem Relation Age of Onset  . Cancer Brother     ROS:  Please see the history of present illness.  All other systems reviewed and negative.   PHYSICAL EXAM: VS:  BP 104/68  Pulse 64  Ht 5\' 10"  (1.778 m)  Wt 173 lb 11.2 oz (78.79 kg)  BMI 24.92 kg/m2 Well nourished, well developed, in no acute distress HEENT: Pupils are equal round react to light accommodation extraocular movements are intact.  Neck: no JVDNo cervical lymphadenopathy. The right carotid bruit a soft left carotid bruit Cardiac: Regular rate and rhythm without murmurs rubs or gallops. Lungs:  clear to auscultation bilaterally, no wheezing, rhonchi or rales Abd: soft, nontender, positive bowel sounds all quadrants, no hepatosplenomegaly Ext: no lower extremity edema.  2+ radial and posterior tibialis pulses. Skin: warm and dry Neuro:  Grossly normal  EKG: Paced 64 beats per minute  ASSESSMENT AND PLAN:  Problem List Items Addressed  This Visit   CAD, 90% LM, Total RCA, 80-90% CFX- S/P urgent CABG X 11 Jun 2012 - Primary     Patient reported a very infrequent chest tightness. It resolved spontaneously. He likened it more indigestion    Relevant Medications      lisinopril (PRINIVIL,ZESTRIL) 20 MG tablet   Other Relevant Orders      EKG 12-Lead   Carotid artery stenosis, LICA stent 10/14/12     Recent carotid Dopplers were 04/21/2014 showed no significant changes. Does have a rather loud right carotid bruit on exam    Relevant Medications      lisinopril (PRINIVIL,ZESTRIL) 20 MG tablet   H/O cardiac arrest, Sept 2013   HLD (hyperlipidemia)      Lipid Panel     Component Value Date/Time   CHOL 151 01/06/2014 1342   TRIG 354* 01/06/2014 1342   HDL 25*  01/06/2014 1342   CHOLHDL 6.0 01/06/2014 1342   VLDL 71* 01/06/2014 1342   LDLCALC 55 01/06/2014 1342    We discussed continued work on lowering carbohydrate intake. Increasing healthy fat intake including fish oil 2 g daily.    Relevant Medications      lisinopril (PRINIVIL,ZESTRIL) 20 MG tablet   Hypertension     Blood pressure is well-controlled initially on the lower side. Lisinopril was increased to 20 mg at previous visit. No current changes.    Relevant Medications      lisinopril (PRINIVIL,ZESTRIL) 20 MG tablet   Ischemic cardiomyopathy, EF 25-30% by echo 06/17/12- pt is on Life Vest     Patient reports no changes since his last office visit. He reports doing "okay". He appears euvolemic as a good job watching his salt intake.    Relevant Medications      lisinopril (PRINIVIL,ZESTRIL) 20 MG tablet   Tobacco abuse

## 2014-07-07 NOTE — Assessment & Plan Note (Signed)
Recent carotid Dopplers were 04/21/2014 showed no significant changes. Does have a rather loud right carotid bruit on exam

## 2014-07-09 ENCOUNTER — Encounter: Payer: Self-pay | Admitting: Cardiology

## 2014-07-15 ENCOUNTER — Telehealth: Payer: Self-pay | Admitting: Internal Medicine

## 2014-07-15 LAB — MDC_IDC_ENUM_SESS_TYPE_REMOTE
Battery Remaining Longevity: 64 mo
Brady Statistic AP VP Percent: 31 %
Brady Statistic AS VP Percent: 67 %
Brady Statistic AS VS Percent: 1 %
Brady Statistic RA Percent Paced: 31 %
Date Time Interrogation Session: 20150928210820
HIGH POWER IMPEDANCE MEASURED VALUE: 65 Ohm
HighPow Impedance: 65 Ohm
Lead Channel Impedance Value: 350 Ohm
Lead Channel Impedance Value: 430 Ohm
Lead Channel Impedance Value: 440 Ohm
Lead Channel Pacing Threshold Amplitude: 0.5 V
Lead Channel Pacing Threshold Amplitude: 1 V
Lead Channel Pacing Threshold Pulse Width: 0.5 ms
Lead Channel Pacing Threshold Pulse Width: 0.5 ms
Lead Channel Pacing Threshold Pulse Width: 0.5 ms
Lead Channel Sensing Intrinsic Amplitude: 12 mV
Lead Channel Setting Pacing Amplitude: 1.5 V
Lead Channel Setting Pacing Amplitude: 2 V
Lead Channel Setting Pacing Amplitude: 2.5 V
Lead Channel Setting Pacing Pulse Width: 0.5 ms
Lead Channel Setting Pacing Pulse Width: 0.5 ms
MDC IDC MSMT BATTERY REMAINING PERCENTAGE: 77 %
MDC IDC MSMT BATTERY VOLTAGE: 2.98 V
MDC IDC MSMT LEADCHNL RA PACING THRESHOLD AMPLITUDE: 0.5 V
MDC IDC MSMT LEADCHNL RA SENSING INTR AMPL: 5 mV
MDC IDC PG SERIAL: 7077567
MDC IDC SET LEADCHNL RV SENSING SENSITIVITY: 0.5 mV
MDC IDC SET ZONE DETECTION INTERVAL: 300 ms
MDC IDC STAT BRADY AP VS PERCENT: 1 %
Zone Setting Detection Interval: 340 ms

## 2014-07-15 NOTE — Progress Notes (Signed)
Remote ICD transmission.   

## 2014-07-15 NOTE — Telephone Encounter (Signed)
New message ° ° ° ° ° ° ° ° ° °Pt would like to know if you received his transmission  °

## 2014-07-15 NOTE — Telephone Encounter (Signed)
Informed pt that we did receive transmission.  

## 2014-07-19 ENCOUNTER — Encounter: Payer: Self-pay | Admitting: Cardiology

## 2014-07-29 ENCOUNTER — Other Ambulatory Visit: Payer: Self-pay | Admitting: Cardiovascular Disease

## 2014-07-29 NOTE — Telephone Encounter (Signed)
Rx was sent to pharmacy electronically. 

## 2014-09-16 ENCOUNTER — Encounter (HOSPITAL_COMMUNITY): Payer: Self-pay | Admitting: Cardiovascular Disease

## 2014-09-23 ENCOUNTER — Telehealth: Payer: Self-pay | Admitting: Internal Medicine

## 2014-09-23 DIAGNOSIS — L03313 Cellulitis of chest wall: Secondary | ICD-10-CM

## 2014-09-23 DIAGNOSIS — I10 Essential (primary) hypertension: Secondary | ICD-10-CM

## 2014-09-23 NOTE — Telephone Encounter (Signed)
C/o fluid built up around icd. Area has turned red with clear drainage. No injury, no fever, no tenderness.

## 2014-09-23 NOTE — Telephone Encounter (Signed)
Patient advised to come to the office in the morning so that nurse can look at the area of c/o. Patient informed to be at office in the morning around 8:15 am.

## 2014-09-23 NOTE — Telephone Encounter (Signed)
Patient said that at the site of his device there is fluid build up and redness.  Stated that they fluid is leaking from this site also. I had him leave a message for [name] to describe what is going on also.

## 2014-09-24 ENCOUNTER — Encounter: Payer: Self-pay | Admitting: Cardiology

## 2014-09-24 ENCOUNTER — Telehealth: Payer: Self-pay | Admitting: *Deleted

## 2014-09-24 ENCOUNTER — Ambulatory Visit (HOSPITAL_COMMUNITY)
Admission: RE | Admit: 2014-09-24 | Discharge: 2014-09-24 | Disposition: A | Discharge status: Home / Self Care | Payer: BC Managed Care – PPO | Source: Ambulatory Visit | Attending: Cardiology | Admitting: Cardiology

## 2014-09-24 DIAGNOSIS — J449 Chronic obstructive pulmonary disease, unspecified: Secondary | ICD-10-CM | POA: Insufficient documentation

## 2014-09-24 DIAGNOSIS — L03313 Cellulitis of chest wall: Secondary | ICD-10-CM | POA: Diagnosis not present

## 2014-09-24 DIAGNOSIS — Z951 Presence of aortocoronary bypass graft: Secondary | ICD-10-CM | POA: Insufficient documentation

## 2014-09-24 DIAGNOSIS — J8410 Pulmonary fibrosis, unspecified: Secondary | ICD-10-CM | POA: Insufficient documentation

## 2014-09-24 DIAGNOSIS — I10 Essential (primary) hypertension: Secondary | ICD-10-CM

## 2014-09-24 DIAGNOSIS — T82897A Other specified complication of cardiac prosthetic devices, implants and grafts, initial encounter: Secondary | ICD-10-CM | POA: Diagnosis present

## 2014-09-24 LAB — CBC
HCT: 44.5 % (ref 39.0–52.0)
Hemoglobin: 15.2 g/dL (ref 13.0–17.0)
MCH: 29.5 pg (ref 26.0–34.0)
MCHC: 34.2 g/dL (ref 30.0–36.0)
MCV: 86.2 fL (ref 78.0–100.0)
MPV: 10.1 fL (ref 9.4–12.4)
PLATELETS: 397 10*3/uL (ref 150–400)
RBC: 5.16 MIL/uL (ref 4.22–5.81)
RDW: 12.5 % (ref 11.5–15.5)
WBC: 7.5 10*3/uL (ref 4.0–10.5)

## 2014-09-24 NOTE — Telephone Encounter (Signed)
Patient informed. 

## 2014-09-24 NOTE — Telephone Encounter (Signed)
Patient came by office to have area of c/o evaluated. A large amount of swelling was noted at site of device, upper left chest area with small amount of clear drainage from incision site. Also, area of incision site is red. No c/o pain or tenderness at site. Dr. Wyline MoodBranch also evaluated swelling of device site and ordered PA and lateral CXR, and CBC. [facility]. Per Dr. Wyline MoodBranch, patient need to be seen next week by EP provider. [facility]

## 2014-09-24 NOTE — Addendum Note (Signed)
Addended by: Eustace MooreANDERSON, Lorieann Argueta M on: 09/24/2014 08:33 AM   Modules accepted: Orders

## 2014-09-24 NOTE — Telephone Encounter (Signed)
Please add to the device clinic schedule for Monday.

## 2014-09-26 ENCOUNTER — Other Ambulatory Visit: Payer: Self-pay | Admitting: Cardiovascular Disease

## 2014-09-27 ENCOUNTER — Encounter: Payer: Self-pay | Admitting: Internal Medicine

## 2014-09-27 ENCOUNTER — Ambulatory Visit (INDEPENDENT_AMBULATORY_CARE_PROVIDER_SITE_OTHER): Payer: BC Managed Care – PPO | Admitting: *Deleted

## 2014-09-27 ENCOUNTER — Ambulatory Visit (INDEPENDENT_AMBULATORY_CARE_PROVIDER_SITE_OTHER): Payer: BC Managed Care – PPO | Admitting: Internal Medicine

## 2014-09-27 DIAGNOSIS — I25810 Atherosclerosis of coronary artery bypass graft(s) without angina pectoris: Secondary | ICD-10-CM | POA: Diagnosis not present

## 2014-09-27 DIAGNOSIS — I447 Left bundle-branch block, unspecified: Secondary | ICD-10-CM | POA: Diagnosis not present

## 2014-09-27 DIAGNOSIS — I10 Essential (primary) hypertension: Secondary | ICD-10-CM

## 2014-09-27 DIAGNOSIS — I255 Ischemic cardiomyopathy: Secondary | ICD-10-CM

## 2014-09-27 DIAGNOSIS — T827XXA Infection and inflammatory reaction due to other cardiac and vascular devices, implants and grafts, initial encounter: Secondary | ICD-10-CM | POA: Insufficient documentation

## 2014-09-27 DIAGNOSIS — Z8674 Personal history of sudden cardiac arrest: Secondary | ICD-10-CM

## 2014-09-27 LAB — MDC_IDC_ENUM_SESS_TYPE_INCLINIC
Battery Remaining Longevity: 60 mo
Brady Statistic RA Percent Paced: 32 %
Brady Statistic RV Percent Paced: 98 %
Date Time Interrogation Session: 20151221132135
HIGH POWER IMPEDANCE MEASURED VALUE: 64 Ohm
HIGH POWER IMPEDANCE MEASURED VALUE: 64.125
Implantable Pulse Generator Serial Number: 7077567
Lead Channel Impedance Value: 350 Ohm
Lead Channel Impedance Value: 450 Ohm
Lead Channel Pacing Threshold Amplitude: 1 V
Lead Channel Pacing Threshold Amplitude: 1 V
Lead Channel Pacing Threshold Pulse Width: 0.5 ms
Lead Channel Pacing Threshold Pulse Width: 0.5 ms
Lead Channel Pacing Threshold Pulse Width: 0.5 ms
Lead Channel Sensing Intrinsic Amplitude: 12 mV
Lead Channel Setting Pacing Amplitude: 2 V
Lead Channel Setting Pacing Amplitude: 2.5 V
Lead Channel Setting Pacing Pulse Width: 0.5 ms
Lead Channel Setting Sensing Sensitivity: 0.5 mV
MDC IDC MSMT LEADCHNL LV IMPEDANCE VALUE: 450 Ohm
MDC IDC MSMT LEADCHNL LV PACING THRESHOLD AMPLITUDE: 0.625 V
MDC IDC MSMT LEADCHNL RA PACING THRESHOLD AMPLITUDE: 0.5 V
MDC IDC MSMT LEADCHNL RA SENSING INTR AMPL: 4.7 mV
MDC IDC MSMT LEADCHNL RV PACING THRESHOLD PULSEWIDTH: 0.5 ms
MDC IDC SET LEADCHNL RA PACING AMPLITUDE: 1.5 V
MDC IDC SET LEADCHNL RV PACING PULSEWIDTH: 0.5 ms
Zone Setting Detection Interval: 300 ms
Zone Setting Detection Interval: 340 ms

## 2014-09-27 NOTE — Telephone Encounter (Signed)
Rx(s) sent to pharmacy electronically.  

## 2014-09-27 NOTE — Progress Notes (Signed)
PCP: [name],[name], MD Primary Cardiologist: Nanetta BattyJonathan Berry, MD  Jonathon Ryan is a 58 y.o. male who presents today for urgent electrophysiology followup. The patient had aborted sudden cardiac death in 2013.  He was found to have severe 3 vessel CAD as the cause and underwent CABG.  He then underwent elective placement of a SJM BIV ICD 2/15.  This was an uneventful procedure and the patient did well, without any difficulty in healing.  He did not have clinical improvement in EF with CRT.  He has had no tachy therapy delivered from his ICD.   Last week, he noticed swelling of his ICD pocket with drainage from the lateral border of the pocket.  He presented to Dr Verna CzechBranch's office and was felt to have a pocket infection.  He is therefore referred for EP evaluation today. He is otherwise feeling well.  The incision is not presently draining. Today, he denies symptoms of fevers, chills, palpitations, chest pain, shortness of breath,  lower extremity edema, dizziness, presyncope, syncope, or ICD shocks.  The patient is otherwise without complaint today.   Past Medical History  Diagnosis Date  . Chronic systolic dysfunction of left ventricle   . Hyperlipidemia   . Cerebrovascular disease 10/15/2012  . Left bundle branch block   . Ischemic cardiomyopathy     s/p BiV ICD implant Feb 2014 - [address] [name] [place], [place] [id number] (serial # [id number]) right atrial lead and a [name] [place], model 5409-81X7122-65Q (serial number [id number]) right ventricular defibrillator lead. [name] Quartet model [id number] - 86 (serial number [id number]) LV lead. [name] Quadra Assura model [place] [id number] (serial Number [id number]) biventricular ICD.   [name]. Cardiac arrest due to underlying cardiac condition     successfully rescucitated/ cooled  . CAD (coronary artery disease) 9/13    s/p CABG  . Headache(784.0)   . Renal artery stenosis   . Peripheral arterial disease   . Hypertension   . Cardiac  defibrillator in place    Past Surgical History  Procedure Laterality Date  . Coronary artery bypass graft  06/11/2012    Procedure: CORONARY ARTERY BYPASS GRAFTING (CABG);  Surgeon: Kerin PernaPeter Van Trigt, MD;  [facility];  Service: Open Heart Surgery;  Laterality: [name];  Coronary Artery Bypass Grafting times four using left internal mammary artery and right greater saphenous vein endoscopically harvested.  [name]. Percutaneous placement intravascular stent cervical carotid artery    . Left heart catheterization with coronary angiogram [name] 06/11/2012    Procedure: LEFT HEART CATHETERIZATION WITH CORONARY ANGIOGRAM;  Surgeon: [name], MD;  [facility];  Service: Cardiovascular;  Laterality: [name];  . Carotid stent insertion [name] 10/14/2012    Procedure: CAROTID STENT INSERTION;  Surgeon: [name], MD;  [facility];  Service: Cardiovascular;  Laterality: [name];  . Carotid angiogram Bilateral 10/14/2012    Procedure: CAROTID ANGIOGRAM;  Surgeon: [name], MD;  [facility];  Service: Cardiovascular;  Laterality: Bilateral;  . Bi-ventricular implantable cardioverter defibrillator [name] 11/25/2012    Procedure: BI-VENTRICULAR IMPLANTABLE CARDIOVERTER DEFIBRILLATOR  (CRT-D);  Surgeon: Jonathon RangeJames Zanovia Rotz, MD;  [facility];  Service: Cardiovascular;  Laterality: [name];    Current Outpatient Prescriptions  Medication Sig Dispense Refill  . aspirin 81 MG tablet Take 81 mg by mouth daily.    [name]. atorvastatin (LIPITOR) 40 MG tablet TAKE 1 TABLET BY MOUTH AT BEDTIME 30 tablet 7  . carvedilol (COREG) 25 MG  tablet TAKE 0.5 TABLETS (12.5 MG TOTAL) BY MOUTH 2 (TWO) TIMES DAILY WITH A MEAL. 30 tablet 6  . clopidogrel (PLAVIX) 75 MG tablet TAKE 1 TABLET BY MOUTH EVERY DAY WITH FOOD 30 tablet 6  . fenofibrate (TRICOR) 145 MG tablet Take 1 tablet (145 mg total) by mouth daily. 30 tablet 5  . lisinopril (PRINIVIL,ZESTRIL) 20 MG tablet Take 10 mg by mouth daily.    [name]. LORazepam (ATIVAN)  0.5 MG tablet Take 0.5 mg by mouth every 8 (eight) hours as needed for anxiety. Anxiety    . omeprazole (PRILOSEC) 40 MG capsule Take 40 mg by mouth daily.      No current facility-administered medications for this visit.    Physical Exam:  GEN- The patient is well appearing, alert and oriented x 3 today.   Head- normocephalic, atraumatic Eyes-  Sclera clear, conjunctiva pink Ears- hearing intact Oropharynx- clear Lungs- Clear to ausculation bilaterally, normal work of breathing Chest- ICD pocket is well healed, previous mild puffiness over the site is not much larger.  There appears to be fluid within the pocket.  The remainder of the pocket is without tenderness erythema, warmth.  There is a small area of erythema along there superior/ lateral portion of the pocket which was recently draining. Heart- Regular rate and rhythm, no murmurs, rubs or gallops, PMI not laterally displaced GI- soft, NT, ND, + BS Extremities- no clubbing, cyanosis, or edema  ICD interrogation- reviewed in detail today,  See PACEART report  Assessment and Plan:  1. Ischemic CM/ chronic systolic dysfunction/ ICD pocket infection The patient appears to have developed a significant pocket infection.  He does not have any system symptoms to suggest bacteremia. He is not device dependant and has not received appropriate therapy from the device previously.   I would recommend BiV ICD system extraction.  Risks, benefits, and alternatives to this procedure were discussed at length with the patient who wishes to proceed. The patient understands that the risks include but are not limited to bleeding, pneumothorax, perforation, tamponade, vascular damage, renal failure, MI, stroke, and death and wishes to proceed. We will therefore schedule the procedure at the next available time.  Dr Ladona Ridgelaylor will return on Monday.  We will see if CV surgical backup is available for that day.  The patient is instructed to contact our office  in the interim should he develop any systemic symptoms of worsening of his local infection.  I do not feel presently that there is a role for antibiotics in treatment of this localized abscess.  Surgical excision is the preferred treatment and is being arranged as above.  See Pace Art report No changes today  2. HTN Stable No change required today    Jonathon RangeJames Oumou Smead MD 09/27/2014 1:55 PM

## 2014-09-27 NOTE — Progress Notes (Signed)
CRT-D device check in office. Thresholds and sensing consistent with previous device measurements. Lead impedance trends stable over time. No mode switch episodes recorded. No ventricular arrhythmia episodes recorded. Patient bi-ventricularly pacing >98 % of the time. Device programmed with appropriate safety margins. Heart failure diagnostics reviewed and trends are stable for patient.

## 2014-09-28 ENCOUNTER — Other Ambulatory Visit: Payer: Self-pay | Admitting: Cardiovascular Disease

## 2014-09-28 ENCOUNTER — Telehealth: Payer: Self-pay | Admitting: *Deleted

## 2014-09-28 NOTE — Telephone Encounter (Signed)
Fenofibrate refilled #30 tablets with 9 refills on 09/27/2014

## 2014-09-28 NOTE — Telephone Encounter (Signed)
Spoke with patient and let him know his procedure is scheduled for 10/04/14  He needs to be at the hospital at 10:30am  NPO after midnight and stop Plavix now until after procedure.

## 2014-09-29 ENCOUNTER — Encounter (HOSPITAL_COMMUNITY): Payer: Self-pay

## 2014-09-29 ENCOUNTER — Encounter (HOSPITAL_COMMUNITY)
Admission: RE | Admit: 2014-09-29 | Discharge: 2014-09-29 | Disposition: A | Discharge status: Home / Self Care | Payer: BC Managed Care – PPO | Source: Ambulatory Visit | Attending: Internal Medicine | Admitting: Internal Medicine

## 2014-09-29 ENCOUNTER — Telehealth: Payer: Self-pay | Admitting: Internal Medicine

## 2014-09-29 DIAGNOSIS — Z79899 Other long term (current) drug therapy: Secondary | ICD-10-CM | POA: Diagnosis not present

## 2014-09-29 DIAGNOSIS — I701 Atherosclerosis of renal artery: Secondary | ICD-10-CM | POA: Diagnosis not present

## 2014-09-29 DIAGNOSIS — Z7982 Long term (current) use of aspirin: Secondary | ICD-10-CM | POA: Diagnosis not present

## 2014-09-29 DIAGNOSIS — I2510 Atherosclerotic heart disease of native coronary artery without angina pectoris: Secondary | ICD-10-CM | POA: Diagnosis not present

## 2014-09-29 DIAGNOSIS — Y92239 Unspecified place in hospital as the place of occurrence of the external cause: Secondary | ICD-10-CM | POA: Diagnosis not present

## 2014-09-29 DIAGNOSIS — I255 Ischemic cardiomyopathy: Secondary | ICD-10-CM | POA: Diagnosis not present

## 2014-09-29 DIAGNOSIS — I739 Peripheral vascular disease, unspecified: Secondary | ICD-10-CM | POA: Diagnosis not present

## 2014-09-29 DIAGNOSIS — T827XXA Infection and inflammatory reaction due to other cardiac and vascular devices, implants and grafts, initial encounter: Secondary | ICD-10-CM | POA: Diagnosis present

## 2014-09-29 DIAGNOSIS — I447 Left bundle-branch block, unspecified: Secondary | ICD-10-CM | POA: Diagnosis not present

## 2014-09-29 DIAGNOSIS — Y838 Other surgical procedures as the cause of abnormal reaction of the patient, or of later complication, without mention of misadventure at the time of the procedure: Secondary | ICD-10-CM | POA: Diagnosis not present

## 2014-09-29 DIAGNOSIS — E785 Hyperlipidemia, unspecified: Secondary | ICD-10-CM | POA: Diagnosis not present

## 2014-09-29 DIAGNOSIS — I10 Essential (primary) hypertension: Secondary | ICD-10-CM | POA: Diagnosis not present

## 2014-09-29 DIAGNOSIS — I5042 Chronic combined systolic (congestive) and diastolic (congestive) heart failure: Secondary | ICD-10-CM | POA: Diagnosis not present

## 2014-09-29 HISTORY — DX: Gastro-esophageal reflux disease without esophagitis: K21.9

## 2014-09-29 HISTORY — DX: Anxiety disorder, unspecified: F41.9

## 2014-09-29 HISTORY — DX: Other specified postprocedural states: Z98.890

## 2014-09-29 HISTORY — DX: Nausea with vomiting, unspecified: R11.2

## 2014-09-29 HISTORY — DX: Other reaction to spinal and lumbar puncture: G97.1

## 2014-09-29 HISTORY — DX: Reserved for inherently not codable concepts without codable children: IMO0001

## 2014-09-29 LAB — BASIC METABOLIC PANEL
Anion gap: 6 (ref 5–15)
BUN: 14 mg/dL (ref 6–23)
CHLORIDE: 104 meq/L (ref 96–112)
CO2: 27 mmol/L (ref 19–32)
Calcium: 10.2 mg/dL (ref 8.4–10.5)
Creatinine, Ser: 1.21 mg/dL (ref 0.50–1.35)
GFR calc Af Amer: 74 mL/min — ABNORMAL LOW (ref >90)
GFR calc non Af Amer: 64 mL/min — ABNORMAL LOW (ref >90)
Glucose, Bld: 92 mg/dL (ref 70–99)
POTASSIUM: 4.3 mmol/L (ref 3.5–5.1)
Sodium: 137 mmol/L (ref 135–145)

## 2014-09-29 LAB — SURGICAL PCR SCREEN
MRSA, PCR: NEGATIVE
STAPHYLOCOCCUS AUREUS: NEGATIVE

## 2014-09-29 NOTE — Progress Notes (Signed)
   09/29/14 1126  OBSTRUCTIVE SLEEP APNEA  Have you ever been diagnosed with sleep apnea through a sleep study? No  Do you snore loudly (loud enough to be heard through closed doors)?  1  Do you often feel tired, fatigued, or sleepy during the daytime? 0  Has anyone observed you stop breathing during your sleep? 0  Do you have, or are you being treated for high blood pressure? 1  BMI more than 35 kg/m2? 0  Age over 59 years old? 1  Neck circumference greater than 40 cm/16 inches? 1  Gender: 1  Obstructive Sleep Apnea Score 5

## 2014-09-29 NOTE — Progress Notes (Signed)
Anesthesia Chart Review:  Patient is a 58 year old male posted for ICD lead removal (left chest) on 10/04/14 by Dr. Ladona Ryan (CT surgeon Dr. Cornelius Ryan is documented as back-up). He was seen on 09/27/14 for significant pocket infection and extraction of his ICD system was recommended.  Dr. Jenel Ryan's notes mention that patient is not device dependent.  History includes CAD/STEMI with VF arrest with CPR/ACLS and Arctic sun protocol s/p CABG (LIMA to LAD, SVG to PDA, SVG to OM1 and OM2) '13, ischemic cardiomyopathy, St. Jude BiV ICD 11/2013, left BBB, former smoker, HLD, PAD with 80% right iliac artery stenosis and 90% right renal artery stenosis, left carotid stent 10/2012, HTN, SOB, GERD, anxiety, post-operative N/V, spinal headache. PCP is Dr. [name].  Primary cardiologist is Dr. [name]. EP cardiologist is Dr. Johney Ryan.  Meds include ASA 81 mg, Lipitor, Coreg, Plavix (held since 09/28/14), Tricor, lisinopril, Prilosec.  Echo on 01/13/14:  Left ventricle: The cavity size was severely dilated. Wall thickness was normal. Systolic function was severely reduced. The estimated ejection fraction was in the range of 25% to 30%. - Mitral valve: Mild regurgitation. Valve area by pressure half-time: 2.24cm^2. - Left atrium: The atrium was mildly dilated. (EF 25-30% by previous echo on 01/22/13.)  12//6/13 Nuclear stress test: Low risk stress nuclear study. Large scar in the RCA distribution. No reversible ischemia. LV Wall Motion: Akinetic inferior, inferolateral and inferoseptal walls. Severely depressed overall systolic function with a calculated EF of 29%.  Last cardiac cath was pre-CABG on 06/11/12 and showed severe left main and 3VCAD with moderate to severe LV dysfunction, EF 30-35%.  Carotid duplex 04/26/14: right ICA 0-49%. Left ICA stent patent without evidence of restenosis. Bilateral ECA demonstrated severe amount of atherosclerotic plaque.  09/24/14 CXR: COPD and evidence of previous  granulomatous infection. There is no evidence of pneumonia nor other acute cardiopulmonary abnormality. No abnormality surrounding the generator is visible radiographically.  Labs from 09/24/14 and 09/29/14 noted.   Anticipate that he can proceed as planned.  Chart left for nursing staff to confirm that [employer] is aware of plans for procedure.  Jonathon Ochsllison Linette Gunderson, PA-C [facility]Anesthesiology Phone [phone] 09/29/2014 6:13 PM

## 2014-09-29 NOTE — Pre-Procedure Instructions (Signed)
Jonathon Ryan  09/29/2014   Your procedure is scheduled on: Monday, October 04, 2014  [facility].  Call this number if you have problems the morning of surgery: [phone]   Remember:   Do not eat food or drink liquids after midnight Sunday, October 03, 2014   Take these medicines the morning of surgery with A SIP OF WATER: carvedilol (COREG), omeprazole (PRILOSEC), if needed:LORazepam (ATIVAN) for anxiety  Stop taking vitamins, Plavix, and herbal medications  (Omega-3 Fatty Acids (FISH OIL). Do not take any NSAIDs ie: Ibuprofen, Advil, Naproxen and etc.   Do not wear jewelry, make-up or nail polish.  Do not wear lotions, powders, or perfumes. You may not wear deodorant.  Do not shave 48 hours prior to surgery. Men may shave face and neck.  Do not bring valuables to the hospital.  Jonathon Ryan is not responsible for any belongings or valuables.               Contacts, dentures or bridgework may not be worn into surgery.  Leave suitcase in the car. After surgery it may be brought to your room.  For patients admitted to the hospital, discharge time is determined by your treatment team.               Patients discharged the day of surgery will not be allowed to drive home.  Name and phone number of your driver:  Special Instructions:  Special Instructions:Special Instructions: Jonathon Ryan - Preparing for Surgery  Before surgery, you can play an important role.  Because skin is not sterile, your skin needs to be as free of germs as possible.  You can reduce the number of germs on you skin by washing with CHG (chlorahexidine gluconate) soap before surgery.  CHG is an antiseptic cleaner which kills germs and bonds with the skin to continue killing germs even after washing.  Please DO NOT use if you have an allergy to CHG or antibacterial soaps.  If your skin becomes reddened/irritated stop using the CHG and inform your nurse when you arrive at  Short Stay.  Do not shave (including legs and underarms) for at least 48 hours prior to the first CHG shower.  You may shave your face.  Please follow these instructions carefully:   1.  Shower with CHG Soap the night before surgery and the morning of Surgery.  2.  If you choose to wash your hair, wash your hair first as usual with your normal shampoo.  3.  After you shampoo, rinse your hair and body thoroughly to remove the Shampoo.  4.  Use CHG as you would any other liquid soap.  You can apply chg directly  to the skin and wash gently with scrungie or a clean washcloth.  5.  Apply the CHG Soap to your body ONLY FROM THE NECK DOWN.  Do not use on open wounds or open sores.  Avoid contact with your eyes, ears, mouth and genitals (private parts).  Wash genitals (private parts) with your normal soap.  6.  Wash thoroughly, paying special attention to the area where your surgery will be performed.  7.  Thoroughly rinse your body with warm water from the neck down.  8.  DO NOT shower/wash with your normal soap after using and rinsing off the CHG Soap.  9.  Pat yourself dry with a clean towel.            10 .  Wear clean pajamas.            11.  Place clean sheets on your bed the night of your first shower and do not sleep with pets.  Day of Surgery  Do not apply any lotions/deodorants the morning of surgery.  Please wear clean clothes to the hospital/surgery center.   Please read over the following fact sheets that you were given: Pain Booklet, Coughing and Deep Breathing, MRSA Information and Surgical Site Infection Prevention

## 2014-09-29 NOTE — Telephone Encounter (Signed)
New Message  [facility] to [facility] if pt can stop taking Aspirin for Surgery on Mon. Please call back and discuss.

## 2014-09-29 NOTE — Progress Notes (Signed)
Pt stated that he gets SOB with exertion but denies chest pain. Pt is currently under the care of Dr. Nanetta BattyJonathan Berry although, seen by Dr. [name], cardiologist pre-operatively. Pt chart forwarded to [place], [place] ( anesthesia) for review. Pt stated last dose of Plavix was taken Tuesday, 09/28/14 per MD instruction; see notes in Epic regarding pt Plavix and Aspirin pre-op instructions.

## 2014-09-29 NOTE — Telephone Encounter (Signed)
Discussed with Dr [name] okay to stay on ASA but he is aware to hold Plavix

## 2014-09-30 NOTE — Progress Notes (Signed)
[employer] called spoke with Arline AspCindy informed of pt having lead replacement surgery on  Monday, states she will let Kerry FortBrian Small know

## 2014-09-30 NOTE — Progress Notes (Signed)
Kerry FortBrian Small returned call and info given to him for surgery on Mon at 1230, and pt will be here at 1030.

## 2014-10-02 ENCOUNTER — Other Ambulatory Visit: Payer: Self-pay | Admitting: *Deleted

## 2014-10-02 DIAGNOSIS — L989 Disorder of the skin and subcutaneous tissue, unspecified: Secondary | ICD-10-CM

## 2014-10-02 DIAGNOSIS — T82897S Other specified complication of cardiac prosthetic devices, implants and grafts, sequela: Principal | ICD-10-CM

## 2014-10-03 MED ORDER — SODIUM CHLORIDE 0.9 % IR SOLN
80.0000 mg | Status: DC
  Filled 2014-10-03: qty 2

## 2014-10-03 MED ORDER — CEFAZOLIN SODIUM-DEXTROSE 2-3 GM-% IV SOLR
2.0000 g | INTRAVENOUS | Status: AC
  Administered 2014-10-04: 2 g via INTRAVENOUS
  Filled 2014-10-03: qty 50

## 2014-10-03 MED ORDER — SODIUM CHLORIDE 0.9 % IV SOLN: INTRAVENOUS | Status: DC

## 2014-10-04 ENCOUNTER — Encounter (HOSPITAL_COMMUNITY)
Admission: RE | Disposition: A | Discharge status: Home / Self Care | Payer: Self-pay | Source: Ambulatory Visit | Attending: Internal Medicine

## 2014-10-04 ENCOUNTER — Ambulatory Visit (HOSPITAL_COMMUNITY): Payer: BC Managed Care – PPO | Admitting: Anesthesiology

## 2014-10-04 ENCOUNTER — Ambulatory Visit (HOSPITAL_COMMUNITY)
Admission: RE | Admit: 2014-10-04 | Discharge: 2014-10-05 | Disposition: A | Discharge status: Home / Self Care | Payer: BC Managed Care – PPO | Source: Ambulatory Visit | Attending: Internal Medicine | Admitting: Internal Medicine

## 2014-10-04 ENCOUNTER — Encounter (HOSPITAL_COMMUNITY): Payer: Self-pay | Admitting: *Deleted

## 2014-10-04 ENCOUNTER — Ambulatory Visit (HOSPITAL_COMMUNITY): Payer: BC Managed Care – PPO | Admitting: Vascular Surgery

## 2014-10-04 DIAGNOSIS — Y92239 Unspecified place in hospital as the place of occurrence of the external cause: Secondary | ICD-10-CM | POA: Insufficient documentation

## 2014-10-04 DIAGNOSIS — I5042 Chronic combined systolic (congestive) and diastolic (congestive) heart failure: Secondary | ICD-10-CM | POA: Insufficient documentation

## 2014-10-04 DIAGNOSIS — T827XXA Infection and inflammatory reaction due to other cardiac and vascular devices, implants and grafts, initial encounter: Secondary | ICD-10-CM

## 2014-10-04 DIAGNOSIS — I2510 Atherosclerotic heart disease of native coronary artery without angina pectoris: Secondary | ICD-10-CM | POA: Insufficient documentation

## 2014-10-04 DIAGNOSIS — E785 Hyperlipidemia, unspecified: Secondary | ICD-10-CM | POA: Insufficient documentation

## 2014-10-04 DIAGNOSIS — I10 Essential (primary) hypertension: Secondary | ICD-10-CM | POA: Insufficient documentation

## 2014-10-04 DIAGNOSIS — I447 Left bundle-branch block, unspecified: Secondary | ICD-10-CM | POA: Insufficient documentation

## 2014-10-04 DIAGNOSIS — I255 Ischemic cardiomyopathy: Secondary | ICD-10-CM | POA: Insufficient documentation

## 2014-10-04 DIAGNOSIS — T82897S Other specified complication of cardiac prosthetic devices, implants and grafts, sequela: Secondary | ICD-10-CM

## 2014-10-04 DIAGNOSIS — I739 Peripheral vascular disease, unspecified: Secondary | ICD-10-CM | POA: Insufficient documentation

## 2014-10-04 DIAGNOSIS — Y838 Other surgical procedures as the cause of abnormal reaction of the patient, or of later complication, without mention of misadventure at the time of the procedure: Secondary | ICD-10-CM | POA: Insufficient documentation

## 2014-10-04 DIAGNOSIS — Z95810 Presence of automatic (implantable) cardiac defibrillator: Secondary | ICD-10-CM

## 2014-10-04 DIAGNOSIS — Z7982 Long term (current) use of aspirin: Secondary | ICD-10-CM | POA: Insufficient documentation

## 2014-10-04 DIAGNOSIS — L989 Disorder of the skin and subcutaneous tissue, unspecified: Secondary | ICD-10-CM

## 2014-10-04 DIAGNOSIS — Z79899 Other long term (current) drug therapy: Secondary | ICD-10-CM | POA: Insufficient documentation

## 2014-10-04 DIAGNOSIS — I701 Atherosclerosis of renal artery: Secondary | ICD-10-CM | POA: Insufficient documentation

## 2014-10-04 HISTORY — PX: ICD LEAD REMOVAL: SHX5855

## 2014-10-04 LAB — TYPE AND SCREEN
ABO/RH(D): O POS
Antibody Screen: NEGATIVE

## 2014-10-04 SURGERY — REMOVAL, ELECTRODE LEAD, ICD
Anesthesia: General | Site: Chest | Laterality: Left

## 2014-10-04 MED ORDER — ROCURONIUM BROMIDE 50 MG/5ML IV SOLN
INTRAVENOUS | Status: AC
  Filled 2014-10-04: qty 1

## 2014-10-04 MED ORDER — SODIUM CHLORIDE 0.9 % IR SOLN
Freq: Once | Status: AC
  Administered 2014-10-04: 80 mL
  Filled 2014-10-04: qty 2

## 2014-10-04 MED ORDER — LORAZEPAM 0.5 MG PO TABS
0.5000 mg | ORAL_TABLET | Freq: Three times a day (TID) | ORAL | Status: DC | PRN
  Administered 2014-10-05: 0.5 mg via ORAL
  Filled 2014-10-04: qty 1

## 2014-10-04 MED ORDER — ATORVASTATIN CALCIUM 40 MG PO TABS
40.0000 mg | ORAL_TABLET | Freq: Every day | ORAL | Status: DC
  Administered 2014-10-04: 40 mg via ORAL
  Filled 2014-10-04: qty 1

## 2014-10-04 MED ORDER — ACETAMINOPHEN 325 MG PO TABS
325.0000 mg | ORAL_TABLET | ORAL | Status: DC | PRN
  Administered 2014-10-04: 650 mg via ORAL
  Filled 2014-10-04: qty 2

## 2014-10-04 MED ORDER — ONDANSETRON HCL 4 MG/2ML IJ SOLN
INTRAMUSCULAR | Status: AC
  Filled 2014-10-04: qty 2

## 2014-10-04 MED ORDER — LIDOCAINE HCL (PF) 1 % IJ SOLN
INTRAMUSCULAR | Status: AC
  Filled 2014-10-04: qty 30

## 2014-10-04 MED ORDER — SUCCINYLCHOLINE CHLORIDE 20 MG/ML IJ SOLN
INTRAMUSCULAR | Status: DC | PRN
  Administered 2014-10-04: 60 mg via INTRAVENOUS

## 2014-10-04 MED ORDER — CHLORHEXIDINE GLUCONATE 4 % EX LIQD
60.0000 mL | Freq: Once | CUTANEOUS | Status: DC
  Filled 2014-10-04: qty 60

## 2014-10-04 MED ORDER — LACTATED RINGERS IV SOLN
INTRAVENOUS | Status: DC
  Administered 2014-10-04: 11:00:00 via INTRAVENOUS

## 2014-10-04 MED ORDER — ROCURONIUM BROMIDE 100 MG/10ML IV SOLN
INTRAVENOUS | Status: DC | PRN
  Administered 2014-10-04: 30 mg via INTRAVENOUS

## 2014-10-04 MED ORDER — 0.9 % SODIUM CHLORIDE (POUR BTL) OPTIME
TOPICAL | Status: DC | PRN
  Administered 2014-10-04: 1000 mL

## 2014-10-04 MED ORDER — PANTOPRAZOLE SODIUM 40 MG PO TBEC
40.0000 mg | DELAYED_RELEASE_TABLET | Freq: Every day | ORAL | Status: DC
  Administered 2014-10-05: 40 mg via ORAL
  Filled 2014-10-04: qty 1

## 2014-10-04 MED ORDER — SODIUM CHLORIDE 0.9 % IR SOLN
Status: DC | PRN
  Administered 2014-10-04: 12:00:00

## 2014-10-04 MED ORDER — ONDANSETRON HCL 4 MG/2ML IJ SOLN
4.0000 mg | Freq: Four times a day (QID) | INTRAMUSCULAR | Status: DC | PRN
  Filled 2014-10-04: qty 2

## 2014-10-04 MED ORDER — MIDAZOLAM HCL 2 MG/2ML IJ SOLN
INTRAMUSCULAR | Status: AC
  Filled 2014-10-04: qty 2

## 2014-10-04 MED ORDER — PROPOFOL 10 MG/ML IV BOLUS
INTRAVENOUS | Status: AC
  Filled 2014-10-04: qty 20

## 2014-10-04 MED ORDER — LIDOCAINE HCL (CARDIAC) 20 MG/ML IV SOLN
INTRAVENOUS | Status: AC
  Filled 2014-10-04: qty 5

## 2014-10-04 MED ORDER — LISINOPRIL 10 MG PO TABS
10.0000 mg | ORAL_TABLET | Freq: Every day | ORAL | Status: DC
  Administered 2014-10-04 – 2014-10-05 (×2): 10 mg via ORAL
  Filled 2014-10-04 (×2): qty 1

## 2014-10-04 MED ORDER — LACTATED RINGERS IV SOLN
INTRAVENOUS | Status: DC | PRN
  Administered 2014-10-04: 12:00:00 via INTRAVENOUS

## 2014-10-04 MED ORDER — PHENYLEPHRINE HCL 10 MG/ML IJ SOLN
10.0000 mg | INTRAMUSCULAR | Status: DC | PRN
  Administered 2014-10-04: 20 ug/min via INTRAVENOUS
  Administered 2014-10-04: 40 ug/min via INTRAVENOUS

## 2014-10-04 MED ORDER — GENTAMICIN IN SALINE 1.6-0.9 MG/ML-% IV SOLN
INTRAVENOUS | Status: AC
  Filled 2014-10-04: qty 50

## 2014-10-04 MED ORDER — GLYCOPYRROLATE 0.2 MG/ML IJ SOLN
INTRAMUSCULAR | Status: DC | PRN
  Administered 2014-10-04: 0.6 mg via INTRAVENOUS

## 2014-10-04 MED ORDER — PROPOFOL 10 MG/ML IV BOLUS
INTRAVENOUS | Status: DC | PRN
  Administered 2014-10-04: 100 mg via INTRAVENOUS
  Administered 2014-10-04: 40 mg via INTRAVENOUS

## 2014-10-04 MED ORDER — MIDAZOLAM HCL 5 MG/5ML IJ SOLN
INTRAMUSCULAR | Status: DC | PRN
  Administered 2014-10-04: 2 mg via INTRAVENOUS

## 2014-10-04 MED ORDER — LIDOCAINE HCL (CARDIAC) 20 MG/ML IV SOLN
INTRAVENOUS | Status: DC | PRN
  Administered 2014-10-04: 50 mg via INTRAVENOUS

## 2014-10-04 MED ORDER — FENTANYL CITRATE 0.05 MG/ML IJ SOLN
INTRAMUSCULAR | Status: DC | PRN
  Administered 2014-10-04: 150 ug via INTRAVENOUS

## 2014-10-04 MED ORDER — LIDOCAINE HCL (PF) 1 % IJ SOLN
INTRAMUSCULAR | Status: DC | PRN
  Administered 2014-10-04: 20 mL

## 2014-10-04 MED ORDER — CEFAZOLIN SODIUM-DEXTROSE 2-3 GM-% IV SOLR
2.0000 g | Freq: Four times a day (QID) | INTRAVENOUS | Status: AC
  Administered 2014-10-04 – 2014-10-05 (×3): 2 g via INTRAVENOUS
  Filled 2014-10-04 (×3): qty 50

## 2014-10-04 MED ORDER — FENTANYL CITRATE 0.05 MG/ML IJ SOLN
INTRAMUSCULAR | Status: AC
  Filled 2014-10-04: qty 5

## 2014-10-04 MED ORDER — NEOSTIGMINE METHYLSULFATE 10 MG/10ML IV SOLN
INTRAVENOUS | Status: DC | PRN
  Administered 2014-10-04: 4 mg via INTRAVENOUS

## 2014-10-04 SURGICAL SUPPLY — 50 items
BAG BANDED W/RUBBER/TAPE 36X54 (MISCELLANEOUS) ×2 IMPLANT
BAG DECANTER FOR FLEXI CONT (MISCELLANEOUS) ×6 IMPLANT
BAG EQP BAND 135X91 W/RBR TAPE (MISCELLANEOUS) ×1
BLADE 10 SAFETY STRL DISP (BLADE) ×1 IMPLANT
BLADE STERNUM SYSTEM 6 (BLADE) IMPLANT
BLADE SURG ROTATE 9660 (MISCELLANEOUS) IMPLANT
BNDG ADH 5X4 AIR PERM ELC (GAUZE/BANDAGES/DRESSINGS) ×1
BNDG COHESIVE 4X5 WHT NS (GAUZE/BANDAGES/DRESSINGS) ×3 IMPLANT
CANISTER SUCTION 2500CC (MISCELLANEOUS) ×3 IMPLANT
COVER TABLE BACK 60X90 (DRAPES) ×3 IMPLANT
DRAPE C-ARM 42X72 X-RAY (DRAPES) IMPLANT
DRAPE CARDIOVASCULAR INCISE (DRAPES) ×3
DRAPE INCISE IOBAN 66X45 STRL (DRAPES) IMPLANT
DRAPE PROXIMA HALF (DRAPES) ×6 IMPLANT
DRAPE SRG 135X102X78XABS (DRAPES) ×1 IMPLANT
ELECT REM PT RETURN 9FT ADLT (ELECTROSURGICAL) ×6
ELECTRODE REM PT RTRN 9FT ADLT (ELECTROSURGICAL) ×2 IMPLANT
GAUZE PACKING IODOFORM 1 (PACKING) IMPLANT
GAUZE SPONGE 4X4 12PLY STRL (GAUZE/BANDAGES/DRESSINGS) ×3 IMPLANT
GAUZE SPONGE 4X4 16PLY XRAY LF (GAUZE/BANDAGES/DRESSINGS) IMPLANT
GLOVE BIOGEL PI IND STRL 6 (GLOVE) IMPLANT
GLOVE BIOGEL PI IND STRL 7.5 (GLOVE) ×1 IMPLANT
GLOVE BIOGEL PI INDICATOR 6 (GLOVE) ×2
GLOVE BIOGEL PI INDICATOR 7.5 (GLOVE) ×6
GLOVE ECLIPSE 8.0 STRL XLNG CF (GLOVE) ×7 IMPLANT
GOWN STRL REUS W/ TWL LRG LVL3 (GOWN DISPOSABLE) ×1 IMPLANT
GOWN STRL REUS W/ TWL XL LVL3 (GOWN DISPOSABLE) ×1 IMPLANT
GOWN STRL REUS W/TWL LRG LVL3 (GOWN DISPOSABLE) ×3
GOWN STRL REUS W/TWL XL LVL3 (GOWN DISPOSABLE) ×3
KIT ROOM TURNOVER OR (KITS) ×3 IMPLANT
NS IRRIG 1000ML POUR BTL (IV SOLUTION) IMPLANT
PAD ARMBOARD 7.5X6 YLW CONV (MISCELLANEOUS) ×6 IMPLANT
PAD ELECT DEFIB RADIOL ZOLL (MISCELLANEOUS) ×3 IMPLANT
SHEATH EVOLUTION RL 11F (SHEATH) ×2 IMPLANT
SPONGE GAUZE 4X4 12PLY STER LF (GAUZE/BANDAGES/DRESSINGS) ×2 IMPLANT
STYLET LIBERATOR LOCKING (MISCELLANEOUS) ×2 IMPLANT
SUT PROLENE 2 0 CT2 30 (SUTURE) IMPLANT
SUT PROLENE 2 0 MH 48 (SUTURE) ×2 IMPLANT
SUT PROLENE 2 0 SH DA (SUTURE) IMPLANT
SUT SILK  1 MH (SUTURE) ×4
SUT SILK 1 MH (SUTURE) IMPLANT
SUT VIC AB 2-0 CT2 18 VCP726D (SUTURE) IMPLANT
SUT VIC AB 3-0 X1 27 (SUTURE) IMPLANT
TAPE CLOTH SURG 4X10 WHT LF (GAUZE/BANDAGES/DRESSINGS) ×2 IMPLANT
TOWEL OR 17X24 6PK STRL BLUE (TOWEL DISPOSABLE) ×6 IMPLANT
TOWEL OR 17X26 10 PK STRL BLUE (TOWEL DISPOSABLE) ×6 IMPLANT
TRAY FOLEY IC TEMP SENS 14FR (CATHETERS) ×3 IMPLANT
TUBE CONNECTING 12'X1/4 (SUCTIONS) ×1
TUBE CONNECTING 12X1/4 (SUCTIONS) ×2 IMPLANT
YANKAUER SUCT BULB TIP NO VENT (SUCTIONS) ×3 IMPLANT

## 2014-10-04 NOTE — Anesthesia Preprocedure Evaluation (Signed)
Anesthesia Evaluation  Patient identified by MRN, date of birth, ID band Patient awake    Reviewed: Allergy & Precautions, H&P , NPO status , Patient's Chart, lab work & pertinent test results  History of Anesthesia Complications (+) PONV and POST - OP SPINAL HEADACHE  Airway Mallampati: II   Neck ROM: full    Dental   Pulmonary shortness of breath, former smoker,          Cardiovascular hypertension, + CAD, + Past MI, + CABG and + Peripheral Vascular Disease + dysrhythmias Ventricular Tachycardia + Cardiac Defibrillator + Valvular Problems/Murmurs MR  ICD pocket infection.  Most recent EF 25-30% with mild MR.   Neuro/Psych  Headaches, Anxiety    GI/Hepatic GERD-  ,  Endo/Other    Renal/GU      Musculoskeletal   Abdominal   Peds  Hematology   Anesthesia Other Findings   Reproductive/Obstetrics                             Anesthesia Physical Anesthesia Plan  ASA: III  Anesthesia Plan: General   Post-op Pain Management:    Induction: Intravenous  Airway Management Planned: Oral ETT  Additional Equipment:   Intra-op Plan:   Post-operative Plan: Extubation in OR  Informed Consent: I have reviewed the patients History and Physical, chart, labs and discussed the procedure including the risks, benefits and alternatives for the proposed anesthesia with the patient or authorized representative who has indicated his/her understanding and acceptance.     Plan Discussed with: CRNA, Anesthesiologist and Surgeon  Anesthesia Plan Comments:         Anesthesia Quick Evaluation

## 2014-10-04 NOTE — Discharge Summary (Signed)
ELECTROPHYSIOLOGY PROCEDURE DISCHARGE SUMMARY    Patient ID: Jonathon Ryan,  MRN: [id number], DOB/AGE: 59/04/1956 59 y.o.  Admit date: 10/04/2014 Discharge date: 10/04/2014  Primary Care Physician: [name],[name], MD Primary Cardiologist: Allyson SabalBerry Electrophysiologist: Allred  Primary Discharge Diagnosis:  ICD system infection status post extraction this admission  Secondary Discharge Diagnosis:  1.  Prior aborted sudden cardiac arrest in 2013 felt 2/2 CAD s/p CABG 2.  CAD s/p CABG 3.  Hyperlipidemia 4.  Peripheral artery disease 5.  Hypertension 6.  LBBB 7.  Congestive heart failure  No Known Allergies   Procedures This Admission: 1.  CRTD system extraction on 10-04-14 by Dr Ladona Ridgelaylor.  The patient's previously implanted STJ CRTD was removed in total.  There were no early apparent complications.  2.  CXR on 10-05-14 demonstrated no pneumothorax status post ICD system extraction   Brief HPI: Jonathon Ryan is a 58 y.o. male with a past medical history significant for prior cardiac arrest 2/2 CAD post CABG.  He underwent CRTD implantation in 2014 for primary prevention and has never had appropriate therapy.  He recently presented to the office with purulent material draining from his incision.  He was evaluated in the office and device system extraction was recommended.  Risks, benefits, and alternatives were reviewed with the patient who wished to proceed.   Hospital Course:  The patient was admitted and underwent CRTD system extraction on 10-04-14 with details as outlined above.  He was monitored on telemetry overnight which demonstrated nsr.  Left chest was without hematoma.  CXR was obtained which demonstrated no ptx status post device system extraction.  He was evaluated by Dr Ladona Ridgelaylor and considered stable for discharge to home.  He will follow up with Dr [name] in the office to discuss timing of device reimplantation. The patient was offered a life vest and  declined.  Discharge Vitals: Blood pressure 151/81, pulse 63, temperature 97.9 F (36.6 C), temperature source Oral, resp. rate 15, height 5\' 10"  (1.778 m), weight 170 lb (77.111 kg), SpO2 100 %.    Labs:   Lab Results  Component Value Date   WBC 7.5 09/24/2014   HGB 15.2 09/24/2014   HCT 44.5 09/24/2014   MCV 86.2 09/24/2014   PLT 397 09/24/2014    Recent Labs Lab 09/29/14 1217  NA 137  K 4.3  CL 104  CO2 27  BUN 14  CREATININE 1.21  CALCIUM 10.2  GLUCOSE 92     Discharge Medications:    Medication List    ASK your doctor about these medications        aspirin 81 MG tablet  Take 81 mg by mouth daily.     atorvastatin 40 MG tablet  Commonly known as:  LIPITOR  TAKE 1 TABLET BY MOUTH AT BEDTIME     carvedilol 25 MG tablet  Commonly known as:  COREG  TAKE 0.5 TABLETS (12.5 MG TOTAL) BY MOUTH 2 (TWO) TIMES DAILY WITH A MEAL.     clopidogrel 75 MG tablet  Commonly known as:  PLAVIX  TAKE 1 TABLET BY MOUTH EVERY DAY WITH FOOD     fenofibrate 145 MG tablet  Commonly known as:  TRICOR  Take 1 tablet (145 mg total) by mouth daily.     Fish Oil 1000 MG Caps  Take 1,000 mg by mouth daily.     lisinopril 20 MG tablet  Commonly known as:  PRINIVIL,ZESTRIL  Take 10 mg by mouth daily.  LORazepam 0.5 MG tablet  Commonly known as:  ATIVAN  Take 0.5 mg by mouth every 8 (eight) hours as needed for anxiety.     omeprazole 40 MG capsule  Commonly known as:  PRILOSEC  Take 40 mg by mouth daily.        Disposition: home    Duration of Discharge Encounter: Greater than 30 minutes including physician time.  Signed,  Leonia ReevesGregg Taylor,M.D.

## 2014-10-04 NOTE — CV Procedure (Signed)
EP Procedure Note  Procedure: ICD system extraction  Preoperative diagnosis: ICD system infection  Postoperative diagnosis: same as preoperative diagnosis  Description of the procedure: After informed consent was obtained, the patient was taken to the operating room in the fasting state. The anesthesia service was utilized to provide general anesthesia. After the usual preparation and draping, 20 cc of lidocaine was infiltrated into the left pectoral region. A 6 cm incision was carried out. Electrocautery was utilized to dissect down to the ICD pocket. On entering the pocket, approximately 30 cc of purulent material was removed. Electrocautery was utilized to assure hemostasis. The pocket was irrigated with antibiotic irrigation. The defibrillator generator was disconnected from the defibrillator and pacing leads. Electrocautery was utilized to free up the fibrous adhesions. A stylette was advanced into the atrial lead and the helix retracted under fluoroscopic guidance. Gentle traction was placed on the lead and it was removed in total. Next gentle traction was placed on the left ventricular lead and it was removed in total. Another stylette was placed in the defibrillator lead and the helix retracted. Gentle traction was placed on the lead but it remained fixed. The lead was cut. A Cook Liberator locking stylette was inserted into the lead. The proximal portion of the lead was secured with silk suture. The [place] 11 [place] dissection sheath was then advanced over the locking stylette and the defibrillator lead. Utilizing a combination of traction and countertraction, pressure and counter pressure, the sheath was worked down to the ICD coil and further down towards the tip of the lead. With additional gentle traction, the lead was removed in total. There were no hemodynamic sequelae. Pressure was held and hemostasis assured. The pocket was irrigated with antibiotic irrigation. The pocket was debrided with  electrocautery. The incision was closed with 2-0 proline suture. A pressure dressing was applied and the patient was returned to recovery area in satisfactory condition.  Estimated blood loss: Less than 30 cc  Complications: There were no immediate procedure complications.  Conclusion: Successful extraction of a biventricular ICD which had been placed 2 years previously and had developed a pocket infection resulting in drainage of infected material from the device pocket.  Lewayne BuntingGregg Taylor, M.D.

## 2014-10-04 NOTE — Anesthesia Postprocedure Evaluation (Signed)
  Anesthesia Post-op Note  Patient: Jonathon Ryan  Procedure(s) Performed: Procedure(s) with comments: REMOVAL [employer] Jude ICD with lead (Left) - Jonathon Ryan is back up for Jonathon Ryan  Patient Location: PACU  Anesthesia Type:General  Level of Consciousness: awake  Airway and Oxygen Therapy: Patient Spontanous Breathing  Post-op Pain: none  Post-op Assessment: Post-op Vital signs reviewed, Patient's Cardiovascular Status Stable, Respiratory Function Stable, Patent Airway, No signs of Nausea or vomiting and Pain level controlled  Post-op Vital Signs: Reviewed and stable  Last Vitals:  Filed Vitals:   10/04/14 1450  BP:   Pulse: 63  Temp:   Resp: 15    Complications: No apparent anesthesia complications

## 2014-10-04 NOTE — Progress Notes (Signed)
Pt inquiry as to why coreg and plavix not scheduled meds for him at this time because his cardiologist told him to always take them. VSS and patient states feeling okay. On-call MD paged for more info. Will continue to monitor closely.

## 2014-10-04 NOTE — Transfer of Care (Signed)
Immediate Anesthesia Transfer of Care Note  Patient: Jonathon Ryan  Procedure(s) Performed: Procedure(s) with comments: REMOVAL Saint Jude ICD with lead (Left) - [name] [facility]  Patient Location: PACU  Anesthesia Type:General  Level of Consciousness: awake and alert   Airway & Oxygen Therapy: Patient Spontanous Breathing and Patient connected to nasal cannula oxygen  Post-op Assessment: Report given to PACU RN and Post -op Vital signs reviewed and stable  Post vital signs: Reviewed and stable  Complications: No apparent anesthesia complications

## 2014-10-04 NOTE — Progress Notes (Signed)
Report to Rohm and HaasMegan RN as primary

## 2014-10-04 NOTE — H&P (Signed)
PCP: Jonathon Ryan,Jonathon ROBERT, MD Primary Cardiologist: Jonathon BattyJonathan Berry, MD  Jonathon Ryan is a 58 y.o. male who presents today for urgent electrophysiology followup. The patient had aborted sudden cardiac death in 2013. He was found to have severe 3 vessel CAD as the cause and underwent CABG. He then underwent elective placement of a SJM BIV ICD 2/15. This was an uneventful procedure and the patient did well, without any difficulty in healing. He did not have clinical improvement in EF with CRT. He has had no tachy therapy delivered from his ICD.  Last week, he noticed swelling of his ICD pocket with drainage from the lateral border of the pocket. He presented to Dr Verna CzechBranch's office and was felt to have a pocket infection. He is therefore referred for EP evaluation today. He is otherwise feeling well. The incision is not presently draining. Today, he denies symptoms of fevers, chills, palpitations, chest pain, shortness of breath, lower extremity edema, dizziness, presyncope, syncope, or ICD shocks. The patient is otherwise without complaint today.   Past Medical History  Diagnosis Date  . Chronic systolic dysfunction of left ventricle   . Hyperlipidemia   . Cerebrovascular disease 10/15/2012  . Left bundle branch block   . Ischemic cardiomyopathy     s/p BiV ICD implant Feb 2014 - [address] [name] [place], [place] [id number] (serial # [id number]) right atrial lead and a [name] [place], model 5409-81X7122-65Q (serial number [id number]) right ventricular defibrillator lead. [name] Quartet model [id number] - 86 (serial number [id number]) LV lead. [name] Quadra Assura model [place] [id number] [phone] [phone] (serial Number [id number]) biventricular ICD.   [name]. Cardiac arrest due to underlying cardiac condition     successfully rescucitated/ cooled  . CAD (coronary artery disease) 9/13    s/p CABG  . Headache(784.0)   . Renal artery stenosis   . Peripheral arterial  disease   . Hypertension   . Cardiac defibrillator in place    Past Surgical History  Procedure Laterality Date  . Coronary artery bypass graft  06/11/2012    Procedure: CORONARY ARTERY BYPASS GRAFTING (CABG); Surgeon: Kerin PernaPeter Van Trigt, MD; [facility]; Service: Open Heart Surgery; Laterality: [name]; Coronary Artery Bypass Grafting times four using left internal mammary artery and right greater saphenous vein endoscopically harvested.  [name]. Percutaneous placement intravascular stent cervical carotid artery    . Left heart catheterization with coronary angiogram [name] 06/11/2012    Procedure: LEFT HEART CATHETERIZATION WITH CORONARY ANGIOGRAM; Surgeon: [name], MD; [facility]; Service: Cardiovascular; Laterality: [name];  . Carotid stent insertion [name] 10/14/2012    Procedure: CAROTID STENT INSERTION; Surgeon: [name], MD; [facility]; Service: Cardiovascular; Laterality: [name];  . Carotid angiogram Bilateral 10/14/2012    Procedure: CAROTID ANGIOGRAM; Surgeon: [name], MD; [facility]; Service: Cardiovascular; Laterality: Bilateral;  . Bi-ventricular implantable cardioverter defibrillator [name] 11/25/2012    Procedure: BI-VENTRICULAR IMPLANTABLE CARDIOVERTER DEFIBRILLATOR (CRT-D); Surgeon: [name], MD; [facility]; Service: Cardiovascular; Laterality: [name];    Current Outpatient Prescriptions  Medication Sig Dispense Refill  . aspirin 81 MG tablet Take 81 mg by mouth daily.    [name]. atorvastatin (LIPITOR) 40 MG tablet TAKE 1 TABLET BY MOUTH AT BEDTIME 30 tablet 7  . carvedilol (COREG) 25 MG tablet TAKE 0.5 TABLETS (12.5 MG TOTAL) BY MOUTH 2 (TWO) TIMES DAILY WITH A MEAL. 30 tablet 6  . clopidogrel (PLAVIX) 75 MG tablet TAKE 1 TABLET BY MOUTH EVERY DAY  WITH FOOD 30 tablet 6  . fenofibrate (TRICOR) 145 MG tablet Take 1 tablet (145 mg total) by mouth  daily. 30 tablet 5  . lisinopril (PRINIVIL,ZESTRIL) 20 MG tablet Take 10 mg by mouth daily.    [name]. LORazepam (ATIVAN) 0.5 MG tablet Take 0.5 mg by mouth every 8 (eight) hours as needed for anxiety. Anxiety    . omeprazole (PRILOSEC) 40 MG capsule Take 40 mg by mouth daily.      No current facility-administered medications for this visit.    Physical Exam:  GEN- The patient is well appearing, alert and oriented x 3 today.  Head- normocephalic, atraumatic Eyes- Sclera clear, conjunctiva pink Ears- hearing intact Oropharynx- clear Lungs- Clear to ausculation bilaterally, normal work of breathing Chest- ICD pocket is well healed, previous mild puffiness over the site is not much larger. There appears to be fluid within the pocket. The remainder of the pocket is without tenderness erythema, warmth. There is a small area of erythema along there superior/ lateral portion of the pocket which was recently draining. Heart- Regular rate and rhythm, no murmurs, rubs or gallops, PMI not laterally displaced GI- soft, NT, ND, + BS Extremities- no clubbing, cyanosis, or edema  ICD interrogation- reviewed in detail today, See PACEART report  Assessment and Plan:  1. Ischemic CM/ chronic systolic dysfunction/ ICD pocket infection The patient appears to have developed a significant pocket infection. He does not have any system symptoms to suggest bacteremia. He is not device dependant and has not received appropriate therapy from the device previously.  I would recommend BiV ICD system extraction. Risks, benefits, and alternatives to this procedure were discussed at length with the patient who wishes to proceed. The patient understands that the risks include but are not limited to bleeding, pneumothorax, perforation, tamponade, vascular damage, renal failure, MI, stroke, and death and wishes to proceed. We will therefore schedule the procedure at the next available time. Dr  Ladona Ridgelaylor will return on Monday. We will see if CV surgical backup is available for that day. The patient is instructed to contact our office in the interim should he develop any systemic symptoms of worsening of his local infection. I do not feel presently that there is a role for antibiotics in treatment of this localized abscess. Surgical excision is the preferred treatment and is being arranged as above.  See Pace Art report No changes today  2. HTN Stable No change required today   Leonia ReevesGregg Piercen Covino,M.D.

## 2014-10-05 ENCOUNTER — Ambulatory Visit (HOSPITAL_COMMUNITY): Payer: BC Managed Care – PPO

## 2014-10-05 ENCOUNTER — Encounter (HOSPITAL_COMMUNITY): Payer: Self-pay | Admitting: *Deleted

## 2014-10-05 DIAGNOSIS — Z95810 Presence of automatic (implantable) cardiac defibrillator: Secondary | ICD-10-CM

## 2014-10-05 DIAGNOSIS — I255 Ischemic cardiomyopathy: Secondary | ICD-10-CM | POA: Diagnosis not present

## 2014-10-05 DIAGNOSIS — T827XXA Infection and inflammatory reaction due to other cardiac and vascular devices, implants and grafts, initial encounter: Secondary | ICD-10-CM | POA: Diagnosis not present

## 2014-10-05 DIAGNOSIS — E785 Hyperlipidemia, unspecified: Secondary | ICD-10-CM | POA: Diagnosis not present

## 2014-10-05 DIAGNOSIS — I2510 Atherosclerotic heart disease of native coronary artery without angina pectoris: Secondary | ICD-10-CM | POA: Diagnosis not present

## 2014-10-05 NOTE — Plan of Care (Signed)
Problem: Phase III Progression Outcomes Goal: Pain controlled on oral analgesia Outcome: Not Applicable Date Met:  48/62/82 Patient denies pain  Problem: Discharge Progression Outcomes Goal: Staples/sutures removed Outcome: Not Met (add Reason) Sutures to be removed outpatient

## 2014-10-05 NOTE — Discharge Summary (Signed)
Discharge instructions reviewed with patient. Patient verbalizes understanding of restrictions s/p ICD removal. Questions answered. Denies further questions. AVS given to patient. IV and telemetry discontinued. Volunteer services called and due to arrive to take patient via wheelchair to main entrance where patient's wife is waiting to drive him home.

## 2014-10-05 NOTE — Discharge Instructions (Signed)
PLEASE REMEMBER TO BRING ALL OF YOUR MEDICATIONS TO EACH OF YOUR FOLLOW-UP OFFICE VISITS. ° °PLEASE ATTEND ALL SCHEDULED FOLLOW-UP APPOINTMENTS.  ° °Activity: Increase activity slowly as tolerated. You may shower, but no soaking baths (or swimming) for 1 week. No driving for 2 days. No lifting over 5 lbs for 1 week. No sexual activity for 1 week.  ° °You May Return to Work: in 1 week (if applicable) ° °Wound Care: You may wash cath site gently with soap and water. Keep cath site clean and dry. If you notice pain, swelling, bleeding or pus at your cath site, please call [phone]. ° ° ° °Site Care °Refer to this sheet in the next few weeks. These instructions provide you with information on caring for yourself after your procedure. Your caregiver may also give you more specific instructions. Your treatment has been planned according to current medical practices, but problems sometimes occur. Call your caregiver if you have any problems or questions after your procedure. °HOME CARE INSTRUCTIONS °· You may shower 24 hours after the procedure. Remove the bandage (dressing) and gently wash the site with plain soap and water. Gently pat the site dry.  °· Do not apply powder or lotion to the site.  °· Do not sit in a bathtub, swimming pool, or whirlpool for 5 to 7 days.  °· No bending, squatting, or lifting anything over 10 pounds (4.5 kg) as directed by your caregiver.  °· Inspect the site at least twice daily.  °· Do not drive home if you are discharged the same day of the procedure. Have someone else drive you.  °· You may drive 24 hours after the procedure unless otherwise instructed by your caregiver.  °What to expect: °· Any bruising will usually fade within 1 to 2 weeks.  °· Blood that collects in the tissue (hematoma) may be painful to the touch. It should usually decrease in size and tenderness within 1 to 2 weeks.  °SEEK IMMEDIATE MEDICAL CARE IF: °· You have unusual pain at the site or down the affected limb.   °· You have redness, warmth, swelling, or pain at the site.  °· You have drainage (other than a small amount of blood on the dressing).  °· You have chills.  °· You have a fever or persistent symptoms for more than 72 hours.  °· You have a fever and your symptoms suddenly get worse.  °· Your leg becomes pale, cool, tingly, or numb.  °· You have heavy bleeding from the site. Hold pressure on the site.  °Document Released: 10/27/2010 Document Revised: 09/13/2011 Document Reviewed: 10/27/2010 °ExitCare® Patient Information ©2012 [employer]. ° °

## 2014-10-12 ENCOUNTER — Telehealth (HOSPITAL_COMMUNITY): Payer: Self-pay | Admitting: *Deleted

## 2014-10-13 ENCOUNTER — Telehealth (HOSPITAL_COMMUNITY): Payer: Self-pay | Admitting: *Deleted

## 2014-10-14 ENCOUNTER — Ambulatory Visit (INDEPENDENT_AMBULATORY_CARE_PROVIDER_SITE_OTHER): Payer: BC Managed Care – PPO | Admitting: *Deleted

## 2014-10-14 DIAGNOSIS — I255 Ischemic cardiomyopathy: Secondary | ICD-10-CM

## 2014-10-14 NOTE — Progress Notes (Signed)
Sutures removed, site rinsed with NSS and redressed per Dr. Ladona Ridgelaylor.  No edema noted, some redness noted.    Follow up as scheduled with Dr. [name].

## 2014-10-22 ENCOUNTER — Telehealth (HOSPITAL_COMMUNITY): Payer: Self-pay | Admitting: *Deleted

## 2014-10-26 ENCOUNTER — Telehealth (HOSPITAL_COMMUNITY): Payer: Self-pay | Admitting: *Deleted

## 2014-11-01 ENCOUNTER — Encounter: Payer: Self-pay | Admitting: Internal Medicine

## 2014-11-01 ENCOUNTER — Ambulatory Visit (INDEPENDENT_AMBULATORY_CARE_PROVIDER_SITE_OTHER): Payer: BC Managed Care – PPO | Admitting: Internal Medicine

## 2014-11-01 VITALS — BP 130/74 | HR 71 | Ht 70.0 in | Wt 167.4 lb

## 2014-11-01 DIAGNOSIS — I255 Ischemic cardiomyopathy: Secondary | ICD-10-CM

## 2014-11-01 DIAGNOSIS — I10 Essential (primary) hypertension: Secondary | ICD-10-CM

## 2014-11-01 NOTE — Progress Notes (Signed)
PCP: Jonathon Hector, MD Primary Cardiologist: Jonathon Batty, MD  Jonathon Ryan is a 59 y.o. male who presents today for electrophysiology followup. The patient had aborted sudden cardiac death in Feb 22, 2012.  He was found to have severe 3 vessel CAD as the cause and underwent CABG.  He then underwent elective placement of a SJM BIV ICD 2/15.  This was an uneventful procedure and the patient did well, without any difficulty in healing.  He did not have clinical improvement in EF with CRT.  He has had no tachy therapy delivered from his ICD.  He developed pocket infection and underwent device extraction by Dr Jonathon Ryan 12/15.  He continues to improve s/p extraction.  He has had some fatigue.  He reports that he is recovering s/p recent URI.  He has had no fevers/ chills.  His ICD pocket is healing slowly. Today, he denies symptoms of   palpitations, chest pain, shortness of breath,  lower extremity edema, dizziness, presyncope, or syncope.  The patient is otherwise without complaint today.   Past Medical History  Diagnosis Date  . Chronic systolic dysfunction of left ventricle   . Hyperlipidemia   . Cerebrovascular disease 10/15/2012  . Left bundle branch block   . Ischemic cardiomyopathy     s/p BiV ICD implant Feb 2014 - [address]. [name] [place], [place] [id number] (serial # [id number]) right atrial lead and a [name] [place], model 5409-81X (serial number [id number]) right ventricular defibrillator lead. [name] Quartet model [phone] - 86 (serial number [id number]) LV lead. [name] Quadra Assura model [place] 2956 [id number] (serial Number [id number]) biventricular ICD.   [name] Cardiac arrest due to underlying cardiac condition     successfully rescucitated/ cooled  . CAD (coronary artery disease) 9/13    s/p CABG  . Headache(784.0)   . Renal artery stenosis   . Peripheral arterial disease   . Hypertension   . Cardiac defibrillator in place   . PONV (postoperative nausea and vomiting)   .  Spinal headache   . Shortness of breath dyspnea     with exertion  . GERD (gastroesophageal reflux disease)   . Anxiety    Past Surgical History  Procedure Laterality Date  . Coronary artery bypass graft  06/11/2012    Procedure: CORONARY ARTERY BYPASS GRAFTING (CABG);  Surgeon: Jonathon Perna, MD;  Location: [facility] OR;  Service: Open Heart Surgery;  Laterality: [name];  Coronary Artery Bypass Grafting times four using left internal mammary artery and right greater saphenous vein endoscopically harvested.  [name] Percutaneous placement intravascular stent cervical carotid artery    . Left heart catheterization with coronary angiogram [name] 06/11/2012    Procedure: LEFT HEART CATHETERIZATION WITH CORONARY ANGIOGRAM;  Surgeon: Jonathon Gess, MD;  Location: [facility] CATH LAB;  Service: Cardiovascular;  Laterality: [name];  . Carotid stent insertion [name] 10/14/2012    Procedure: CAROTID STENT INSERTION;  Surgeon: Jonathon Libman, MD;  Location: [facility] CATH LAB;  Service: Cardiovascular;  Laterality: [name];  . Carotid angiogram Bilateral 10/14/2012    Procedure: CAROTID ANGIOGRAM;  Surgeon: Jonathon Libman, MD;  Location: [facility] CATH LAB;  Service: Cardiovascular;  Laterality: Bilateral;  . Bi-ventricular implantable cardioverter defibrillator [name] 11/25/2012    Procedure: BI-VENTRICULAR IMPLANTABLE CARDIOVERTER DEFIBRILLATOR  (CRT-D);  Surgeon: Jonathon Range, MD;  Location: [facility] CATH LAB;  Service: Cardiovascular;  Laterality: [name];  . Icd lead removal Left 10/04/2014    Procedure: REMOVAL Saint Jude ICD with lead;  Surgeon: Jonathon Ryan  Jonathon Ridgelaylor, MD;  [facility];  Service: Cardiovascular;  Laterality: Left;  [facility]    Current Outpatient Prescriptions  Medication Sig Dispense Refill  . aspirin 81 MG tablet Take 81 mg by mouth daily.    [name]. atorvastatin (LIPITOR) 40 MG tablet TAKE 1 TABLET BY MOUTH AT BEDTIME 30 tablet 7  . carvedilol (COREG) 25 MG tablet TAKE 0.5 TABLETS (12.5 MG TOTAL) BY MOUTH 2 (TWO) TIMES DAILY WITH  A MEAL. 30 tablet 6  . clopidogrel (PLAVIX) 75 MG tablet TAKE 1 TABLET BY MOUTH EVERY DAY WITH FOOD 30 tablet 6  . fenofibrate (TRICOR) 145 MG tablet Take 1 tablet (145 mg total) by mouth daily. 30 tablet 9  . lisinopril (PRINIVIL,ZESTRIL) 20 MG tablet Take 10 mg by mouth daily.    [name]. LORazepam (ATIVAN) 0.5 MG tablet TAKE 1 TABLET 3 TIMES A DAY    . Omega-3 Fatty Acids (FISH OIL) 1000 MG CAPS Take 1,000 mg by mouth daily.    [name]. omeprazole (PRILOSEC) 40 MG capsule Take 40 mg by mouth daily.      No current facility-administered medications for this visit.    Physical Exam:  GEN- The patient is well appearing, alert and oriented x 3 today.   Head- normocephalic, atraumatic Eyes-  Sclera clear, conjunctiva pink Ears- hearing intact Oropharynx- clear Lungs- Clear to ausculation bilaterally, normal work of breathing Chest- ICD pocket is helaling Heart- Regular rate and rhythm, no murmurs, rubs or gallops, PMI not laterally displaced GI- soft, NT, ND, + BS Extremities- no clubbing, cyanosis, or edema  ekg reveals sinus rhythm with LBBB  Assessment and Plan:  1. Ischemic CM/ chronic systolic dysfunction Doing well s/p BiV ICD exraction.  His pocket is not completely healed but is progressing. Today, we had a long discussion about possible reimplantation.  At this time, he would like to defer this.  As he is very active and right handed, he does not wish to have a right sided device placed.  We could consider reimplantation on the left side, but I would want to wait a long time before doing so given his recent device pocket infection.  As he has not benefited previously from CRT, it would also be reasonable to consider subQ ICD.  We did discuss this option today.  Presently, the patient is clear that he does not wish for an ICD implant.  Given his life preferences, I feel that his decision is reasonable.  He will continue medicine optimization and follow-up for CAD with Dr Allyson SabalBerry. I will see him  again in 3 months at which time we will again consider possible ICD implant.  2. HTN Stable No change required today    Jonathon RangeJames Quavion Boule MD 11/01/2014 8:37 PM

## 2014-11-01 NOTE — Patient Instructions (Signed)
Your physician recommends that you schedule a follow-up appointment in 3 months with Dr Allred    

## 2014-11-05 ENCOUNTER — Other Ambulatory Visit (HOSPITAL_COMMUNITY): Payer: Self-pay | Admitting: Cardiovascular Disease

## 2014-11-05 DIAGNOSIS — I701 Atherosclerosis of renal artery: Secondary | ICD-10-CM

## 2014-12-10 ENCOUNTER — Encounter: Payer: BC Managed Care – PPO | Admitting: Internal Medicine

## 2014-12-13 ENCOUNTER — Ambulatory Visit (HOSPITAL_COMMUNITY)
Admission: RE | Admit: 2014-12-13 | Discharge: 2014-12-13 | Disposition: A | Discharge status: Home / Self Care | Payer: Medicare Other | Source: Ambulatory Visit | Attending: Cardiovascular Disease | Admitting: Cardiovascular Disease

## 2014-12-13 DIAGNOSIS — I701 Atherosclerosis of renal artery: Secondary | ICD-10-CM | POA: Diagnosis present

## 2014-12-13 NOTE — Progress Notes (Signed)
Renal Duplex Completed. Aaima Gaddie, BS, RDMS, RVT  

## 2014-12-21 ENCOUNTER — Encounter: Payer: Self-pay | Admitting: *Deleted

## 2014-12-31 ENCOUNTER — Ambulatory Visit (INDEPENDENT_AMBULATORY_CARE_PROVIDER_SITE_OTHER): Payer: Medicare Other | Admitting: Cardiovascular Disease

## 2014-12-31 ENCOUNTER — Encounter: Payer: Self-pay | Admitting: Cardiovascular Disease

## 2014-12-31 VITALS — BP 136/92 | HR 84 | Ht 70.0 in | Wt 168.9 lb

## 2014-12-31 DIAGNOSIS — I2583 Coronary atherosclerosis due to lipid rich plaque: Secondary | ICD-10-CM

## 2014-12-31 DIAGNOSIS — E785 Hyperlipidemia, unspecified: Secondary | ICD-10-CM

## 2014-12-31 DIAGNOSIS — I739 Peripheral vascular disease, unspecified: Secondary | ICD-10-CM

## 2014-12-31 DIAGNOSIS — T827XXA Infection and inflammatory reaction due to other cardiac and vascular devices, implants and grafts, initial encounter: Secondary | ICD-10-CM

## 2014-12-31 DIAGNOSIS — I2510 Atherosclerotic heart disease of native coronary artery without angina pectoris: Secondary | ICD-10-CM | POA: Diagnosis not present

## 2014-12-31 DIAGNOSIS — I10 Essential (primary) hypertension: Secondary | ICD-10-CM

## 2014-12-31 DIAGNOSIS — Z720 Tobacco use: Secondary | ICD-10-CM | POA: Diagnosis not present

## 2014-12-31 DIAGNOSIS — I6522 Occlusion and stenosis of left carotid artery: Secondary | ICD-10-CM

## 2014-12-31 NOTE — Assessment & Plan Note (Signed)
History of peripheral arterial disease with angiographic documented high-grade right renal artery stenosis and iliac disease at the time of all followed by duplex ultrasound. These have remained stable

## 2014-12-31 NOTE — Assessment & Plan Note (Signed)
History of carotid artery disease status post carotid artery stenting by myself 10/28/12 under the "[facility] protocol for high risk asymptomatic disease. We continued to follow this duplex ultrasound and has remained stable. He remains on antibiotic therapy

## 2014-12-31 NOTE — Assessment & Plan Note (Signed)
History of biventricular ICD implant with subsequent explant by Dr. Ladona Ridgelaylor because of a pocket infection. He is now followed by Dr. [name]. His infection is healed. His EF when last measured was 25-30%. We have talked about putting an ice the back and place although he probably does not need a biventricular device given the fact that he was a nonresponder. We also talked about using Tyrex to hopefully mitigate future pocket infections.

## 2014-12-31 NOTE — Progress Notes (Signed)
12/31/2014 Jonathon Ryan   08/02/1956  [id number]  Primary Physician [name],Jonathon ROBERT, MD Primary Cardiologist: Runell GessJonathan J. Berry MD Jonathon RenoFACP,[name],[name], [name]   HPI:   The patient is a 59 year old, thin-appearing, married Caucasian male with no children who I last saw in the office 01/07/59. He had witnessed cardiac arrest with bystander CPR and defibrillation, arctic sun who presented for emergent cardiac catheterization by myself revealing left main 3-vessel disease and he ultimately underwent emergency coronary artery bypass grafting by Dr. Kathlee NationsPeter Van Ryan with LIMA to his LAD, vein to a PDA, sequential vein to OM1 and 2. His EF was 25% to 30%. He made a full neurologic recovery. He is wearing a LifeVest. His other problems include hypertension and hyperlipidemia and discontinued tobacco abuse which he stopped June 21, 2012. He also has peripheral vascular occlusive disease with high-grade left internal carotid artery stenosis followed by duplex ultrasound, high-grade right renal artery stenosis and right iliac stenosis though he denies claudication. We have gotten Dopplers on both his renals and his lower extremities. He had a Myoview stress test that showed an inferior scar without ischemia with an EF in the 25%-30% Ryan confirmed by 2D echo. I performed carotid stenting on him under the "sapphire protocol" for high risk asymptomatic patients on October 14, 2012, with an excellent result. Followup Dopplers of his carotidis performed on October 28, 2012, were normal. Dr. Hillis RangeJames Ryan performed implantation of a BiV ICD on November 25, 2012, which has resulted in marked improvement in him clinically with regards to exercise tolerance. We continue to follow his carotid and renal Dopplers which have remained stable. His last lotion and Dopplers were performed a year ago in January and these were stable as well. He denies claudication. He did unfortunately suffer a pocket infection of his ICD  requiring explant. He is following up with Dr. [name] and plans on having discussion with him in several months regarding his desire to have a an ICD replaced on the opposite side. Apparently he was not a IV responded although he did say he felt better after it was implanted.  Current Outpatient Prescriptions  Medication Sig Dispense Refill  . aspirin 81 MG tablet Take 81 mg by mouth daily.    [name]. atorvastatin (LIPITOR) 40 MG tablet TAKE 1 TABLET BY MOUTH AT BEDTIME 30 tablet 7  . carvedilol (COREG) 25 MG tablet TAKE 0.5 TABLETS (12.5 MG TOTAL) BY MOUTH 2 (TWO) TIMES DAILY WITH A MEAL. 30 tablet 6  . clopidogrel (PLAVIX) 75 MG tablet TAKE 1 TABLET BY MOUTH EVERY DAY WITH FOOD 30 tablet 6  . fenofibrate (TRICOR) 145 MG tablet Take 1 tablet (145 mg total) by mouth daily. 30 tablet 9  . lisinopril (PRINIVIL,ZESTRIL) 20 MG tablet Take 10 mg by mouth daily.    [name]. LORazepam (ATIVAN) 0.5 MG tablet TAKE 1 TABLET 3 TIMES A DAY    . Omega-3 Fatty Acids (FISH OIL) 1000 MG CAPS Take 1,000 mg by mouth daily.    [name]. omeprazole (PRILOSEC) 40 MG capsule Take 40 mg by mouth daily.      No current facility-administered medications for this visit.    No Known Allergies  History   Social History  . Marital Status: Married    Spouse Name: [name]  . Number of Children: [name]  . Years of Education: [name]   Occupational History  . Not on file.   Social History Main Topics  . Smoking status: Former Smoker -- 1.50 packs/day for  40 years    Types: Cigarettes    Quit date: 06/11/2012  . Smokeless tobacco: Never Used  . Alcohol Use: 3.6 - 4.8 oz/week    6-8 Cans of beer per week     Comment: occasional  . Drug Use: No  . Sexual Activity: Yes   Other Topics Concern  . Not on file   Social History Narrative     Review of Systems: General: negative for chills, fever, night sweats or weight changes.  Cardiovascular: negative for chest pain, dyspnea on exertion, edema, orthopnea, palpitations, paroxysmal  nocturnal dyspnea or shortness of breath Dermatological: negative for rash Respiratory: negative for cough or wheezing Urologic: negative for hematuria Abdominal: negative for nausea, vomiting, diarrhea, bright red blood per rectum, melena, or hematemesis Neurologic: negative for visual changes, syncope, or dizziness All other systems reviewed and are otherwise negative except as noted above.    Blood pressure 136/92, pulse 84, height  (1.778 m), weight 168 lb 14.4 oz (76.613 kg).  General appearance: alert and no distress Neck: no adenopathy, no carotid bruit, no JVD, supple, symmetrical, trachea midline and thyroid not enlarged, symmetric, no tenderness/mass/nodules Lungs: clear to auscultation bilaterally Heart: regular rate and rhythm, S1, S2 normal, no murmur, click, rub or gallop Extremities: extremities normal, atraumatic, no cyanosis or edema  EKG not performed today  ASSESSMENT AND PLAN:   Tobacco abuse Discontinued at time of cardiac arrest   CAD, 90% LM, Total RCA, 80-90% CFX- S/P urgent CABG X 11 Jun 2012 [name] has ischemic cardiomyopathy. With an ejection fraction in the 25-30% Ryan by 2-D echo a year ago. He had witnessed cardiac arrest with bystander CPR and defibrillation, emergency heart catheterization by myself revealing left main/three-vessel disease with subsequent emergency coronary artery bypass grafting by Dr.Van Ryan . He had a LIMA to his LAD, vein to the PDA and sequential vein to OM1 and OM 2. He denies chest pain or shortness of breath.   ICD (implantable cardioverter-defibrillator) infection History of biventricular ICD implant with subsequent explant by [name] because of a pocket infection. He is now followed by Dr. Johney Ryan. His infection is healed. His EF when last measured was 25-30%. We have talked about putting an ice the back and place although he probably does not need a biventricular device given the fact that he was a nonresponder. We  also talked about using Tyrex to hopefully mitigate future pocket infections.   Hypertension History of hypertension blood pressure measured at 136/92. He is on carvedilol, and lisinopril. Continue current meds at current dosing   HLD (hyperlipidemia) History of hyperlipidemia on atorvastatin 40 and fenofibrate followed by his PCP   Carotid artery stenosis, LICA stent 10/14/12 History of carotid artery disease status post carotid artery stenting by myself 10/28/12 under the "[place] protocol for high risk asymptomatic disease. We continued to follow this duplex ultrasound and has remained stable. He remains on antibiotic therapy   PVD, 90% Rt RAS, 80% Rt Iliac at cath History of peripheral arterial disease with angiographic documented high-grade right renal artery stenosis and iliac disease at the time of all followed by duplex ultrasound. These have remained stable       Runell Gess MD [facility], [facility] 12/31/2014 9:41 AM

## 2014-12-31 NOTE — Assessment & Plan Note (Signed)
Discontinued at time of cardiac arrest

## 2014-12-31 NOTE — Assessment & Plan Note (Signed)
History of hypertension blood pressure measured at 136/92. He is on carvedilol, and lisinopril. Continue current meds at current dosing

## 2014-12-31 NOTE — Patient Instructions (Signed)
Your physician wants you to follow-up in: 1 year with Dr Berry. You will receive a reminder letter in the mail two months in advance. If you don't receive a letter, please call our office to schedule the follow-up appointment.  

## 2014-12-31 NOTE — Assessment & Plan Note (Signed)
History of hyperlipidemia on atorvastatin 40 and fenofibrate followed by his PCP

## 2014-12-31 NOTE — Assessment & Plan Note (Signed)
[name] has ischemic cardiomyopathy. With an ejection fraction in the 25-30% range by 2-D echo a year ago. He had witnessed cardiac arrest with bystander CPR and defibrillation, emergency heart catheterization by myself revealing left main/three-vessel disease with subsequent emergency coronary artery bypass grafting by [name] . He had a LIMA to his LAD, vein to the PDA and sequential vein to OM1 and OM 2. He denies chest pain or shortness of breath.

## 2015-01-03 ENCOUNTER — Encounter (INDEPENDENT_AMBULATORY_CARE_PROVIDER_SITE_OTHER): Payer: Self-pay | Admitting: *Deleted

## 2015-02-18 ENCOUNTER — Ambulatory Visit (INDEPENDENT_AMBULATORY_CARE_PROVIDER_SITE_OTHER): Payer: Medicare Other | Admitting: Internal Medicine

## 2015-02-18 ENCOUNTER — Encounter: Payer: Self-pay | Admitting: Internal Medicine

## 2015-02-18 VITALS — BP 152/88 | HR 59 | Ht 70.0 in | Wt 168.0 lb

## 2015-02-18 DIAGNOSIS — I447 Left bundle-branch block, unspecified: Secondary | ICD-10-CM | POA: Diagnosis not present

## 2015-02-18 DIAGNOSIS — I10 Essential (primary) hypertension: Secondary | ICD-10-CM | POA: Diagnosis not present

## 2015-02-18 DIAGNOSIS — I2510 Atherosclerotic heart disease of native coronary artery without angina pectoris: Secondary | ICD-10-CM

## 2015-02-18 DIAGNOSIS — I255 Ischemic cardiomyopathy: Secondary | ICD-10-CM

## 2015-02-18 DIAGNOSIS — Z8674 Personal history of sudden cardiac arrest: Secondary | ICD-10-CM

## 2015-02-18 DIAGNOSIS — I2583 Coronary atherosclerosis due to lipid rich plaque: Secondary | ICD-10-CM

## 2015-02-18 NOTE — Progress Notes (Signed)
PCP: [name],[name], MD Primary Cardiologist: Nanetta BattyJonathan Berry, MD  Jonathon Ryan is a 59 y.o. male who presents today for electrophysiology followup. The patient had aborted sudden cardiac death in 2013.  He was found to have severe 3 vessel CAD as the cause and underwent CABG.  He then underwent elective placement of a SJM BIV ICD 2/15.  This was an uneventful procedure and the patient did well, without any difficulty in healing.  He did not have clinical improvement in EF with CRT.  He has had no tachy therapy delivered from his ICD.  He developed pocket infection and underwent device extraction by Dr Ladona Ridgelaylor 12/15.  He is back to normal activity.  His ICD pocket has completely healed.  He did not feel any clinical deterioration without CRT and did not respond initially to CRT. Today, he denies symptoms of palpitations, chest pain, shortness of breath,  lower extremity edema, dizziness, presyncope, or syncope.  The patient is otherwise without complaint today.   Past Medical History  Diagnosis Date  . Chronic systolic dysfunction of left ventricle   . Hyperlipidemia   . Cerebrovascular disease 10/15/2012  . Left bundle branch block   . Ischemic cardiomyopathy     s/p BiV ICD implant Feb 2014 - [address] [name] [place], [place] [id number] (serial # [id number]) right atrial lead and a [name] [place], model 5409-81X7122-65Q (serial number [id number]) right ventricular defibrillator lead. [name] Quartet model [id number] - 86 (serial number [id number]) LV lead. [name] Quadra [place] model [place] [id number] [phone] (serial Number [id number]) biventricular ICD.   [name]. Cardiac arrest due to underlying cardiac condition     successfully rescucitated/ cooled  . CAD (coronary artery disease) 9/13    s/p CABG  . Headache(784.0)   . Renal artery stenosis   . Peripheral arterial disease   . Hypertension   . Cardiac defibrillator in place   . PONV (postoperative nausea and vomiting)   . Spinal headache   .  Shortness of breath dyspnea     with exertion  . GERD (gastroesophageal reflux disease)   . Anxiety   . Infection of pacemaker pocket     of ICD with subsequent explant   Past Surgical History  Procedure Laterality Date  . Coronary artery bypass graft  06/11/2012    Procedure: CORONARY ARTERY BYPASS GRAFTING (CABG);  Surgeon: Kerin PernaPeter Van Trigt, MD;  [facility];  Service: Open Heart Surgery;  Laterality: [name];  Coronary Artery Bypass Grafting times four using left internal mammary artery and right greater saphenous vein endoscopically harvested.  [name]. Percutaneous placement intravascular stent cervical carotid artery    . Left heart catheterization with coronary angiogram [name] 06/11/2012    Procedure: LEFT HEART CATHETERIZATION WITH CORONARY ANGIOGRAM;  Surgeon: [name], MD;  [facility];  Service: Cardiovascular;  Laterality: [name];  . Carotid stent insertion [name] 10/14/2012    Procedure: CAROTID STENT INSERTION;  Surgeon: [name], MD;  [facility];  Service: Cardiovascular;  Laterality: [name];  . Carotid angiogram Bilateral 10/14/2012    Procedure: CAROTID ANGIOGRAM;  Surgeon: [name], MD;  [facility];  Service: Cardiovascular;  Laterality: Bilateral;  . Bi-ventricular implantable cardioverter defibrillator [name] 11/25/2012    Procedure: BI-VENTRICULAR IMPLANTABLE CARDIOVERTER DEFIBRILLATOR  (CRT-D);  Surgeon: Hillis RangeJames Layliana Devins, MD;  [facility];  Service: Cardiovascular;  Laterality: [name];  . Icd lead removal Left 10/04/2014    Procedure: REMOVAL [facility]  ICD with lead;  Surgeon: [name], MD;  [facility];  Service: Cardiovascular;  Laterality: Left;  [name]    Current Outpatient Prescriptions  Medication Sig Dispense Refill  . aspirin 81 MG tablet Take 81 mg by mouth daily.    [name]. atorvastatin (LIPITOR) 80 MG tablet Take 80 mg by mouth daily.    . carvedilol (COREG) 25 MG tablet TAKE 0.5 TABLETS (12.5 MG TOTAL) BY  MOUTH 2 (TWO) TIMES DAILY WITH A MEAL. 30 tablet 6  . clopidogrel (PLAVIX) 75 MG tablet TAKE 1 TABLET BY MOUTH EVERY DAY WITH FOOD 30 tablet 6  . fenofibrate (TRICOR) 145 MG tablet Take 1 tablet (145 mg total) by mouth daily. 30 tablet 9  . lisinopril (PRINIVIL,ZESTRIL) 20 MG tablet Take 10 mg by mouth daily.    [name]. LORazepam (ATIVAN) 0.5 MG tablet TAKE 1 TABLET 3 TIMES A DAY    . Omega-3 Fatty Acids (FISH OIL) 1000 MG CAPS Take 1,000 mg by mouth daily.    [name]. omeprazole (PRILOSEC) 40 MG capsule Take 40 mg by mouth daily.      No current facility-administered medications for this visit.    Physical Exam:  GEN- The patient is well appearing, alert and oriented x 3 today.   Head- normocephalic, atraumatic Eyes-  Sclera clear, conjunctiva pink Ears- hearing intact Oropharynx- clear Lungs- Clear to ausculation bilaterally, normal work of breathing Chest- ICD pocket is helaling Heart- Regular rate and rhythm, no murmurs, rubs or gallops, PMI not laterally displaced GI- soft, NT, ND, + BS Extremities- no clubbing, cyanosis, or edema  ekg reveals sinus rhythm with LBBB  Assessment and Plan:  1. Ischemic CM/ chronic systolic dysfunction Today, we had a long discussion about possible reimplantation.  He did not respond to CRT and therefore is not certain that he would need CRT.  He does think that he would like to have an ICD implanted.  Dr Allyson SabalBerry mentioned Tyrex pouch as a possible ancillary consideration.  Today, I also offered SubQ ICD as an option.  He would like to consider this further. I will repeat an echo at this time (last echo 4/15).  If his EF remains <35% then I will refer to Dr Ladona Ridgelaylor for discussion of SubQ ICD.  I will defer to Dr Ladona Ridgelaylor as to whether we proceed with transvenous or SubQ ICD.  I did discuss this with Dr Ladona Ridgelaylor by phone this am and he is happy to see the patient.  The patient is on an optimal medical regimen.  Perhaps his EF has improved.  2. HTN Stable No change  required today   Hillis RangeJames Keondra Haydu MD 02/18/2015 10:34 AM

## 2015-02-18 NOTE — Patient Instructions (Signed)
Your physician recommends that you continue on your current medications as directed. Please refer to the Current Medication list given to you today. Your physician has requested that you have an echocardiogram. Echocardiography is a painless test that uses sound waves to create images of your heart. It provides your doctor with information about the size and shape of your heart and how well your heart's chambers and valves are working. This procedure takes approximately one hour. There are no restrictions for this procedure. You follow up will be determined after your echo is completed.

## 2015-02-22 ENCOUNTER — Other Ambulatory Visit: Payer: Self-pay | Admitting: Cardiovascular Disease

## 2015-02-22 ENCOUNTER — Other Ambulatory Visit: Payer: Self-pay | Admitting: *Deleted

## 2015-02-22 MED ORDER — CLOPIDOGREL BISULFATE 75 MG PO TABS: 75.0000 mg | ORAL_TABLET | Freq: Every day | ORAL | Status: DC

## 2015-02-23 ENCOUNTER — Ambulatory Visit (INDEPENDENT_AMBULATORY_CARE_PROVIDER_SITE_OTHER): Payer: Medicare Other

## 2015-02-23 ENCOUNTER — Other Ambulatory Visit: Payer: Self-pay

## 2015-02-23 DIAGNOSIS — I255 Ischemic cardiomyopathy: Secondary | ICD-10-CM | POA: Diagnosis not present

## 2015-03-01 ENCOUNTER — Telehealth: Payer: Self-pay | Admitting: *Deleted

## 2015-03-01 DIAGNOSIS — Z95810 Presence of automatic (implantable) cardiac defibrillator: Secondary | ICD-10-CM

## 2015-03-01 NOTE — Telephone Encounter (Signed)
Patient informed. 

## 2015-03-08 ENCOUNTER — Other Ambulatory Visit: Payer: Self-pay | Admitting: Cardiovascular Disease

## 2015-03-09 ENCOUNTER — Ambulatory Visit (INDEPENDENT_AMBULATORY_CARE_PROVIDER_SITE_OTHER): Payer: Medicare Other | Admitting: Internal Medicine

## 2015-03-09 ENCOUNTER — Encounter: Payer: Self-pay | Admitting: Internal Medicine

## 2015-03-09 VITALS — BP 142/80 | HR 63 | Ht 70.0 in | Wt 165.8 lb

## 2015-03-09 DIAGNOSIS — I255 Ischemic cardiomyopathy: Secondary | ICD-10-CM

## 2015-03-09 DIAGNOSIS — Z01812 Encounter for preprocedural laboratory examination: Secondary | ICD-10-CM

## 2015-03-09 DIAGNOSIS — Z720 Tobacco use: Secondary | ICD-10-CM | POA: Diagnosis not present

## 2015-03-09 DIAGNOSIS — T827XXS Infection and inflammatory reaction due to other cardiac and vascular devices, implants and grafts, sequela: Secondary | ICD-10-CM | POA: Diagnosis not present

## 2015-03-09 DIAGNOSIS — I447 Left bundle-branch block, unspecified: Secondary | ICD-10-CM

## 2015-03-09 NOTE — Assessment & Plan Note (Signed)
He is encouraged on smoking cessation.

## 2015-03-09 NOTE — Patient Instructions (Signed)
Medication Instructions:  Your physician recommends that you continue on your current medications as directed. Please refer to the Current Medication list given to you today.   Labwork: NONE  Testing/Procedures: NONE  Follow-Up: Your physician recommends that you schedule a follow-up appointment in: 10-14 days in Device Clinic for a Wound Check  Your physician recommends that you schedule a follow-up appointment in: 3 months with Dr. Ladona Ridgelaylor.   Any Other Special Instructions Will Be Listed Below (If Applicable). I will call you before the end of the week with the details of your procedure

## 2015-03-09 NOTE — Assessment & Plan Note (Signed)
He is currently stable. He will continue his current medical therapy. No anginal symptoms.

## 2015-03-09 NOTE — Progress Notes (Signed)
HPI [name] returns today for followup. He is a pleasant 59 yo man with a primary VF arrest, CAD, LBBB, s/p ICD implant who developed an ICD infection several months ago and underwent extraction. In the interim he has been stable. The patient presents today to consider insertion of a new ICD. As he did not have any improvement in his heart failure class with insertion of the BiV device or worsening after his BiV device was removed, we would consider a subcutaneous ICD.  He denies chest pain or sob.  No Known Allergies   Current Outpatient Prescriptions  Medication Sig Dispense Refill  . aspirin 81 MG tablet Take 81 mg by mouth daily.    [name] atorvastatin (LIPITOR) 80 MG tablet Take 80 mg by mouth daily.    . carvedilol (COREG) 25 MG tablet Take 0.5 tablets (12.5 mg total) by mouth 2 (two) times daily with a meal. 30 tablet 9  . clopidogrel (PLAVIX) 75 MG tablet Take 1 tablet (75 mg total) by mouth daily. with food 30 tablet 8  . fenofibrate (TRICOR) 145 MG tablet Take 1 tablet (145 mg total) by mouth daily. 30 tablet 9  . lisinopril (PRINIVIL,ZESTRIL) 20 MG tablet Take 10 mg by mouth daily.    [name] LORazepam (ATIVAN) 0.5 MG tablet Take 0.5 mg by mouth 3 (three) times daily.     . Omega-3 Fatty Acids (FISH OIL) 1000 MG CAPS Take 1,000 mg by mouth daily.    [name] omeprazole (PRILOSEC) 40 MG capsule Take 40 mg by mouth daily.      No current facility-administered medications for this visit.     Past Medical History  Diagnosis Date  . Chronic systolic dysfunction of left ventricle   . Hyperlipidemia   . Cerebrovascular disease 10/15/2012  . Left bundle branch block   . Ischemic cardiomyopathy     s/p BiV ICD implant Feb 2014 - [address]. [name] [place], [place] [id number]-41 (serial # [id number]) right atrial lead and a [name] [place], model 4239-53U (serial number [id number]) right ventricular defibrillator lead. [name] Quartet model [id number] - 86 (serial number [id number]) LV lead.  [name] Quadra [place]ura model [place] 3568 [phone] (serial Number [id number]) biventricular ICD.   [name] Cardiac arrest due to underlying cardiac condition     successfully rescucitated/ cooled  . CAD (coronary artery disease) 9/13    s/p CABG  . Headache(784.0)   . Renal artery stenosis   . Peripheral arterial disease   . Hypertension   . Cardiac defibrillator in place   . PONV (postoperative nausea and vomiting)   . Spinal headache   . Shortness of breath dyspnea     with exertion  . GERD (gastroesophageal reflux disease)   . Anxiety   . Infection of pacemaker pocket     of ICD with subsequent explant    ROS:   All systems reviewed and negative except as noted in the HPI.   Past Surgical History  Procedure Laterality Date  . Coronary artery bypass graft  06/11/2012    Procedure: CORONARY ARTERY BYPASS GRAFTING (CABG);  Surgeon: [name], MD;  Location[facility] OR;  Service: Open Heart Surgery;  Laterality: [name];  Coronary Artery Bypass Grafting times four using left internal mammary artery and right greater saphenous vein endoscopically harvested.  [name] Percutaneous placement intravascular stent cervical carotid artery    . Left heart catheterization with coronary angiogram [name] 06/11/2012    Procedure: LEFT  HEART CATHETERIZATION WITH CORONARY ANGIOGRAM;  Surgeon: [name], MD;  [facility];  Service: Cardiovascular;  Laterality: [name];  . Carotid stent insertion [name] 10/14/2012    Procedure: CAROTID STENT INSERTION;  Surgeon: [name], MD;  [facility];  Service: Cardiovascular;  Laterality: [name];  . Carotid angiogram Bilateral 10/14/2012    Procedure: CAROTID ANGIOGRAM;  Surgeon: [name], MD;  [facility];  Service: Cardiovascular;  Laterality: Bilateral;  . Bi-ventricular implantable cardioverter defibrillator [name] 11/25/2012    Procedure: BI-VENTRICULAR IMPLANTABLE CARDIOVERTER DEFIBRILLATOR  (CRT-D);  Surgeon: [name], MD;  [facility];  Service: Cardiovascular;  Laterality: [name];  . Icd lead removal Left 10/04/2014    Procedure: REMOVAL Saint Jude ICD with lead;  Surgeon: [name], MD;  [facility];  Service: Cardiovascular;  Laterality: Left;  Cornelius Jon[facility]     Family History  Problem Relation Age of Onset  . Cancer Brother      History   Social History  . Marital Status: Married    Spouse Name: [name]  . Number of Children: [name]  . Years of Education: [name]   Occupational History  . Not on file.   Social History Main Topics  . Smoking status: Former Smoker -- 1.50 packs/day for 40 years    Types: Cigarettes    Quit date: 06/11/2012  . Smokeless tobacco: Never Used  . Alcohol Use: 3.6 - 4.8 oz/week    6-8 Cans of beer per week     Comment: occasional  . Drug Use: No  . Sexual Activity: Yes   Other Topics Concern  . Not on file   Social History Narrative     BP 142/80 mmHg  Pulse 63  Ht 5\' 10"  (1.778 m)  Wt 165 lb 12.8 oz (75.206 kg)  BMI 23.79 kg/m2  Physical Exam:  Well appearing middle aged man, NAD HEENT: Unremarkable Neck:  No JVD, no thyromegally Back:  No CVA tenderness Lungs:  Clear with no wheezes HEART:  Regular rate rhythm, no murmurs, no rubs, no clicks Abd:  soft, positive bowel sounds, no organomegally, no rebound, no guarding Ext:  2 plus pulses, no edema, no cyanosis, no clubbing Skin:  No rashes no nodules Neuro:  CN II through XII intact, motor grossly intact  EKG - nsr with LBBB  Assess/Plan:

## 2015-03-09 NOTE — Telephone Encounter (Signed)
Dr. Berry's patient 

## 2015-03-09 NOTE — Telephone Encounter (Signed)
Rx(s) sent to pharmacy electronically.  

## 2015-03-09 NOTE — Assessment & Plan Note (Signed)
He is s/p extraction several months ago. No evidence of recurrent infection. His wound is well healed.

## 2015-03-09 NOTE — Assessment & Plan Note (Signed)
His heart failure symptoms did not improve with biv pacing. They did not worsen when we removed his device. An LV lead will not be part of his new implant. Hopefully, LBBB will not affect his SQ ICD screening.

## 2015-03-10 ENCOUNTER — Other Ambulatory Visit: Payer: Self-pay

## 2015-03-10 DIAGNOSIS — Z01812 Encounter for preprocedural laboratory examination: Secondary | ICD-10-CM

## 2015-03-10 DIAGNOSIS — I255 Ischemic cardiomyopathy: Secondary | ICD-10-CM

## 2015-03-10 LAB — CBC WITH DIFFERENTIAL/PLATELET
BASOS PCT: 0.5 % (ref 0.0–3.0)
Basophils Absolute: 0 10*3/uL (ref 0.0–0.1)
EOS ABS: 0.2 10*3/uL (ref 0.0–0.7)
EOS PCT: 2.3 % (ref 0.0–5.0)
HEMATOCRIT: 42.1 % (ref 39.0–52.0)
Hemoglobin: 14.4 g/dL (ref 13.0–17.0)
Lymphocytes Relative: 31.3 % (ref 12.0–46.0)
Lymphs Abs: 2.9 10*3/uL (ref 0.7–4.0)
MCHC: 34.2 g/dL (ref 30.0–36.0)
MCV: 85.2 fl (ref 78.0–100.0)
MONOS PCT: 4.7 % (ref 3.0–12.0)
Monocytes Absolute: 0.4 10*3/uL (ref 0.1–1.0)
NEUTROS PCT: 61.2 % (ref 43.0–77.0)
Neutro Abs: 5.6 10*3/uL (ref 1.4–7.7)
Platelets: 326 10*3/uL (ref 150.0–400.0)
RBC: 4.94 Mil/uL (ref 4.22–5.81)
RDW: 13.2 % (ref 11.5–15.5)
WBC: 9.1 10*3/uL (ref 4.0–10.5)

## 2015-03-10 NOTE — Addendum Note (Signed)
Addended by: Theressa StampsHECKER, Keonta Alsip N on: 03/10/2015 11:04 AM   Modules accepted: Orders

## 2015-03-18 ENCOUNTER — Other Ambulatory Visit (INDEPENDENT_AMBULATORY_CARE_PROVIDER_SITE_OTHER): Payer: Medicare Other | Admitting: *Deleted

## 2015-03-18 DIAGNOSIS — I10 Essential (primary) hypertension: Secondary | ICD-10-CM | POA: Diagnosis not present

## 2015-03-18 DIAGNOSIS — E785 Hyperlipidemia, unspecified: Secondary | ICD-10-CM | POA: Diagnosis not present

## 2015-03-18 LAB — BASIC METABOLIC PANEL
BUN: 16 mg/dL (ref 6–23)
CALCIUM: 10.1 mg/dL (ref 8.4–10.5)
CO2: 27 meq/L (ref 19–32)
CREATININE: 1.04 mg/dL (ref 0.40–1.50)
Chloride: 104 mEq/L (ref 96–112)
GFR: 77.36 mL/min (ref >60.00)
Glucose, Bld: 94 mg/dL (ref 70–99)
POTASSIUM: 4.3 meq/L (ref 3.5–5.1)
Sodium: 137 mEq/L (ref 135–145)

## 2015-03-18 LAB — CBC WITH DIFFERENTIAL/PLATELET
BASOS ABS: 0 10*3/uL (ref 0.0–0.1)
BASOS PCT: 0.5 % (ref 0.0–3.0)
EOS PCT: 3.3 % (ref 0.0–5.0)
Eosinophils Absolute: 0.2 10*3/uL (ref 0.0–0.7)
HCT: 40.5 % (ref 39.0–52.0)
Hemoglobin: 13.7 g/dL (ref 13.0–17.0)
Lymphocytes Relative: 27.3 % (ref 12.0–46.0)
Lymphs Abs: 1.8 10*3/uL (ref 0.7–4.0)
MCHC: 33.8 g/dL (ref 30.0–36.0)
MCV: 85.7 fl (ref 78.0–100.0)
Monocytes Absolute: 0.5 10*3/uL (ref 0.1–1.0)
Monocytes Relative: 7.3 % (ref 3.0–12.0)
NEUTROS PCT: 61.6 % (ref 43.0–77.0)
Neutro Abs: 4 10*3/uL (ref 1.4–7.7)
Platelets: 333 10*3/uL (ref 150.0–400.0)
RBC: 4.73 Mil/uL (ref 4.22–5.81)
RDW: 13.1 % (ref 11.5–15.5)
WBC: 6.6 10*3/uL (ref 4.0–10.5)

## 2015-03-18 NOTE — Addendum Note (Signed)
Addended by: Tonita Phoenix on: 03/18/2015 08:55 AM   Modules accepted: Orders

## 2015-03-18 NOTE — Addendum Note (Signed)
Addended by: BOWDEN, ROBIN K on: 03/18/2015 08:55 AM   Modules accepted: Orders  

## 2015-03-23 MED ORDER — SODIUM CHLORIDE 0.9 % IR SOLN
80.0000 mg | Status: AC
  Administered 2015-03-24: 80 mg
  Filled 2015-03-23: qty 2

## 2015-03-23 MED ORDER — CEFAZOLIN SODIUM-DEXTROSE 2-3 GM-% IV SOLR
2.0000 g | INTRAVENOUS | Status: AC
  Administered 2015-03-24: 2 g via INTRAVENOUS

## 2015-03-24 ENCOUNTER — Ambulatory Visit (HOSPITAL_COMMUNITY)
Admission: RE | Admit: 2015-03-24 | Discharge: 2015-03-25 | Disposition: A | Discharge status: Home / Self Care | Payer: Medicare Other | Source: Ambulatory Visit | Attending: Internal Medicine | Admitting: Internal Medicine

## 2015-03-24 ENCOUNTER — Encounter (HOSPITAL_COMMUNITY)
Admission: RE | Disposition: A | Discharge status: Home / Self Care | Payer: Medicare Other | Source: Ambulatory Visit | Attending: Internal Medicine

## 2015-03-24 ENCOUNTER — Ambulatory Visit (HOSPITAL_COMMUNITY): Payer: Medicare Other | Admitting: Anesthesiology

## 2015-03-24 ENCOUNTER — Encounter (HOSPITAL_COMMUNITY): Payer: Self-pay | Admitting: Anesthesiology

## 2015-03-24 DIAGNOSIS — Z87891 Personal history of nicotine dependence: Secondary | ICD-10-CM | POA: Insufficient documentation

## 2015-03-24 DIAGNOSIS — Z7982 Long term (current) use of aspirin: Secondary | ICD-10-CM | POA: Insufficient documentation

## 2015-03-24 DIAGNOSIS — I471 Supraventricular tachycardia: Secondary | ICD-10-CM | POA: Diagnosis not present

## 2015-03-24 DIAGNOSIS — I255 Ischemic cardiomyopathy: Secondary | ICD-10-CM | POA: Diagnosis present

## 2015-03-24 DIAGNOSIS — I447 Left bundle-branch block, unspecified: Secondary | ICD-10-CM | POA: Insufficient documentation

## 2015-03-24 DIAGNOSIS — Z8674 Personal history of sudden cardiac arrest: Secondary | ICD-10-CM | POA: Diagnosis not present

## 2015-03-24 DIAGNOSIS — I5022 Chronic systolic (congestive) heart failure: Secondary | ICD-10-CM | POA: Diagnosis not present

## 2015-03-24 DIAGNOSIS — E785 Hyperlipidemia, unspecified: Secondary | ICD-10-CM | POA: Insufficient documentation

## 2015-03-24 DIAGNOSIS — Z95810 Presence of automatic (implantable) cardiac defibrillator: Secondary | ICD-10-CM | POA: Diagnosis present

## 2015-03-24 DIAGNOSIS — Z951 Presence of aortocoronary bypass graft: Secondary | ICD-10-CM | POA: Diagnosis not present

## 2015-03-24 DIAGNOSIS — Z7902 Long term (current) use of antithrombotics/antiplatelets: Secondary | ICD-10-CM | POA: Diagnosis not present

## 2015-03-24 DIAGNOSIS — I2510 Atherosclerotic heart disease of native coronary artery without angina pectoris: Secondary | ICD-10-CM | POA: Diagnosis not present

## 2015-03-24 HISTORY — PX: EP IMPLANTABLE DEVICE: SHX172B

## 2015-03-24 LAB — SURGICAL PCR SCREEN
MRSA, PCR: NEGATIVE
Staphylococcus aureus: NEGATIVE

## 2015-03-24 SURGERY — SUBQ ICD IMPLANT
Anesthesia: Monitor Anesthesia Care

## 2015-03-24 MED ORDER — FENTANYL CITRATE (PF) 100 MCG/2ML IJ SOLN
INTRAMUSCULAR | Status: DC | PRN
  Administered 2015-03-24 (×6): 50 ug via INTRAVENOUS

## 2015-03-24 MED ORDER — LORAZEPAM 0.5 MG PO TABS
0.5000 mg | ORAL_TABLET | Freq: Three times a day (TID) | ORAL | Status: DC
  Administered 2015-03-24 – 2015-03-25 (×2): 0.5 mg via ORAL
  Filled 2015-03-24 (×2): qty 1

## 2015-03-24 MED ORDER — BUPIVACAINE HCL (PF) 0.25 % IJ SOLN
INTRAMUSCULAR | Status: DC | PRN
  Administered 2015-03-24: 110 mg

## 2015-03-24 MED ORDER — ASPIRIN EC 81 MG PO TBEC
81.0000 mg | DELAYED_RELEASE_TABLET | Freq: Every day | ORAL | Status: DC
Start: 2015-03-24 — End: 2015-03-25
  Administered 2015-03-25: 81 mg via ORAL
  Filled 2015-03-24 (×2): qty 1

## 2015-03-24 MED ORDER — ONDANSETRON HCL 4 MG/2ML IJ SOLN: 4.0000 mg | Freq: Four times a day (QID) | INTRAMUSCULAR | Status: DC | PRN

## 2015-03-24 MED ORDER — SODIUM CHLORIDE 0.9 % IV SOLN
INTRAVENOUS | Status: DC
  Administered 2015-03-24 (×2): via INTRAVENOUS

## 2015-03-24 MED ORDER — BUPIVACAINE HCL (PF) 0.25 % IJ SOLN
INTRAMUSCULAR | Status: AC
  Filled 2015-03-24: qty 30

## 2015-03-24 MED ORDER — LISINOPRIL 10 MG PO TABS
10.0000 mg | ORAL_TABLET | Freq: Every day | ORAL | Status: DC
  Administered 2015-03-25: 10 mg via ORAL
  Filled 2015-03-24: qty 1

## 2015-03-24 MED ORDER — HEPARIN (PORCINE) IN NACL 2-0.9 UNIT/ML-% IJ SOLN
INTRAMUSCULAR | Status: AC
  Filled 2015-03-24: qty 500

## 2015-03-24 MED ORDER — DEXMEDETOMIDINE HCL IN NACL 200 MCG/50ML IV SOLN
INTRAVENOUS | Status: DC | PRN
  Administered 2015-03-24: .4 ug/kg/h via INTRAVENOUS

## 2015-03-24 MED ORDER — CLOPIDOGREL BISULFATE 75 MG PO TABS
75.0000 mg | ORAL_TABLET | Freq: Every day | ORAL | Status: DC
  Administered 2015-03-24 – 2015-03-25 (×2): 75 mg via ORAL
  Filled 2015-03-24 (×2): qty 1

## 2015-03-24 MED ORDER — OXYCODONE-ACETAMINOPHEN 5-325 MG PO TABS
1.0000 | ORAL_TABLET | ORAL | Status: DC | PRN
  Administered 2015-03-24 – 2015-03-25 (×2): 1 via ORAL
  Filled 2015-03-24 (×2): qty 1

## 2015-03-24 MED ORDER — MUPIROCIN 2 % EX OINT
1.0000 "application " | TOPICAL_OINTMENT | Freq: Once | CUTANEOUS | Status: AC
  Administered 2015-03-24: 1 via TOPICAL

## 2015-03-24 MED ORDER — BUPIVACAINE HCL (PF) 0.25 % IJ SOLN
INTRAMUSCULAR | Status: AC
  Filled 2015-03-24: qty 60

## 2015-03-24 MED ORDER — SODIUM CHLORIDE 0.9 % IR SOLN
Status: AC
  Filled 2015-03-24: qty 2

## 2015-03-24 MED ORDER — CEFAZOLIN SODIUM-DEXTROSE 2-3 GM-% IV SOLR
INTRAVENOUS | Status: AC
  Filled 2015-03-24: qty 50

## 2015-03-24 MED ORDER — CEFAZOLIN SODIUM-DEXTROSE 2-3 GM-% IV SOLR
2.0000 g | Freq: Four times a day (QID) | INTRAVENOUS | Status: AC
  Administered 2015-03-24 – 2015-03-25 (×3): 2 g via INTRAVENOUS
  Filled 2015-03-24 (×3): qty 50

## 2015-03-24 MED ORDER — PHENYLEPHRINE HCL 10 MG/ML IJ SOLN
INTRAMUSCULAR | Status: DC | PRN
  Administered 2015-03-24: 80 ug via INTRAVENOUS
  Administered 2015-03-24: 40 ug via INTRAVENOUS
  Administered 2015-03-24: 80 ug via INTRAVENOUS

## 2015-03-24 MED ORDER — CHLORHEXIDINE GLUCONATE 4 % EX LIQD: 60.0000 mL | Freq: Once | CUTANEOUS | Status: DC

## 2015-03-24 MED ORDER — ACETAMINOPHEN 325 MG PO TABS
325.0000 mg | ORAL_TABLET | ORAL | Status: DC | PRN
  Administered 2015-03-24: 650 mg via ORAL
  Filled 2015-03-24: qty 2

## 2015-03-24 MED ORDER — PANTOPRAZOLE SODIUM 40 MG PO TBEC
40.0000 mg | DELAYED_RELEASE_TABLET | Freq: Every day | ORAL | Status: DC
  Administered 2015-03-24: 40 mg via ORAL
  Filled 2015-03-24: qty 1

## 2015-03-24 MED ORDER — MUPIROCIN 2 % EX OINT
TOPICAL_OINTMENT | CUTANEOUS | Status: AC
  Filled 2015-03-24: qty 22

## 2015-03-24 MED ORDER — CARVEDILOL 12.5 MG PO TABS
12.5000 mg | ORAL_TABLET | Freq: Two times a day (BID) | ORAL | Status: DC
Start: 2015-03-24 — End: 2015-03-25
  Administered 2015-03-25: 12.5 mg via ORAL
  Filled 2015-03-24: qty 1

## 2015-03-24 MED ORDER — ATORVASTATIN CALCIUM 80 MG PO TABS
80.0000 mg | ORAL_TABLET | Freq: Every day | ORAL | Status: DC
  Administered 2015-03-24: 80 mg via ORAL
  Filled 2015-03-24: qty 1

## 2015-03-24 MED ORDER — MIDAZOLAM HCL 5 MG/5ML IJ SOLN
INTRAMUSCULAR | Status: DC | PRN
  Administered 2015-03-24: 1 mg via INTRAVENOUS
  Administered 2015-03-24: 2 mg via INTRAVENOUS
  Administered 2015-03-24: 1 mg via INTRAVENOUS

## 2015-03-24 SURGICAL SUPPLY — 10 items
BLANKET WARM UNDERBOD FULL ACC (MISCELLANEOUS) ×3
CABLE SURGICAL S-101-97-12 (CABLE)
ICD SUBQ EMBLEM S-ICD 3401 (Lead) ×3 IMPLANT
ICD SUBQ EMBLEM S-PULSE A209 (ICD Generator) ×3 IMPLANT
PAD DEFIB LIFELINK (PAD) ×6
SHEATH CLASSIC 7F (SHEATH)
SHEATH CLASSIC 8F (SHEATH)
SHEATH CLASSIC 9.5F (SHEATH)
SHEATH CLASSIC 9F (SHEATH)
TRAY PACEMAKER INSERTION (CUSTOM PROCEDURE TRAY) ×3

## 2015-03-24 NOTE — Anesthesia Preprocedure Evaluation (Signed)
Anesthesia Evaluation  Patient identified by MRN, date of birth, ID band Patient awake    Reviewed: Allergy & Precautions, NPO status , Patient's Chart, lab work & pertinent test results  History of Anesthesia Complications (+) PONV and history of anesthetic complications  Airway Mallampati: II   Neck ROM: full    Dental   Pulmonary shortness of breath, former smoker,  breath sounds clear to auscultation        Cardiovascular hypertension, + CAD, + Past MI, + CABG, + Peripheral Vascular Disease and +CHF + dysrhythmias Atrial Fibrillation Rhythm:regular Rate:Normal     Neuro/Psych  Headaches, Anxiety    GI/Hepatic GERD-  ,  Endo/Other    Renal/GU      Musculoskeletal   Abdominal   Peds  Hematology   Anesthesia Other Findings   Reproductive/Obstetrics                             Anesthesia Physical Anesthesia Plan  ASA: III  Anesthesia Plan: MAC   Post-op Pain Management:    Induction: Intravenous  Airway Management Planned: Simple Face Mask  Additional Equipment:   Intra-op Plan:   Post-operative Plan:   Informed Consent: I have reviewed the patients History and Physical, chart, labs and discussed the procedure including the risks, benefits and alternatives for the proposed anesthesia with the patient or authorized representative who has indicated his/her understanding and acceptance.     Plan Discussed with: CRNA, Anesthesiologist and Surgeon  Anesthesia Plan Comments:         Anesthesia Quick Evaluation

## 2015-03-24 NOTE — Anesthesia Postprocedure Evaluation (Signed)
Anesthesia Post Note  Patient: Jonathon Ryan  Procedure(s) Performed: Procedure(s) (LRB): SubQ ICD Implant ([name])  Anesthesia type: MAC  Patient location: PACU  Post pain: Pain level controlled and Adequate analgesia  Post assessment: Post-op Vital signs reviewed, Patient's Cardiovascular Status Stable and Respiratory Function Stable  Last Vitals:  Filed Vitals:   03/24/15 1545  BP: 84/42  Pulse: 49  Temp:   Resp: 12    Post vital signs: Reviewed and stable  Level of consciousness: awake, alert  and oriented  Complications: No apparent anesthesia complications

## 2015-03-24 NOTE — H&P (View-Only) (Signed)
HPI [name] returns today for followup. He is a pleasant 59 yo man with a primary VF arrest, CAD, LBBB, s/p ICD implant who developed an ICD infection several months ago and underwent extraction. In the interim he has been stable. The patient presents today to consider insertion of a new ICD. As he did not have any improvement in his heart failure class with insertion of the BiV device or worsening after his BiV device was removed, we would consider a subcutaneous ICD.  He denies chest pain or sob.  No Known Allergies   Current Outpatient Prescriptions  Medication Sig Dispense Refill  . aspirin 81 MG tablet Take 81 mg by mouth daily.    [name] atorvastatin (LIPITOR) 80 MG tablet Take 80 mg by mouth daily.    . carvedilol (COREG) 25 MG tablet Take 0.5 tablets (12.5 mg total) by mouth 2 (two) times daily with a meal. 30 tablet 9  . clopidogrel (PLAVIX) 75 MG tablet Take 1 tablet (75 mg total) by mouth daily. with food 30 tablet 8  . fenofibrate (TRICOR) 145 MG tablet Take 1 tablet (145 mg total) by mouth daily. 30 tablet 9  . lisinopril (PRINIVIL,ZESTRIL) 20 MG tablet Take 10 mg by mouth daily.    [name] LORazepam (ATIVAN) 0.5 MG tablet Take 0.5 mg by mouth 3 (three) times daily.     . Omega-3 Fatty Acids (FISH OIL) 1000 MG CAPS Take 1,000 mg by mouth daily.    [name] omeprazole (PRILOSEC) 40 MG capsule Take 40 mg by mouth daily.      No current facility-administered medications for this visit.     Past Medical History  Diagnosis Date  . Chronic systolic dysfunction of left ventricle   . Hyperlipidemia   . Cerebrovascular disease 10/15/2012  . Left bundle branch block   . Ischemic cardiomyopathy     s/p BiV ICD implant Feb 2014 - [address]. [name] [place], [place] 4599HF-41 (serial # [id number]) right atrial lead and a [name] [place], model 4239-53U (serial number [id number]) right ventricular defibrillator lead. [name] Quartet model [phone] (serial number [id number]) LV lead.  [name] Quadra Assura model [place] 3568 [phone] (serial Number [id number]) biventricular ICD.   [name] Cardiac arrest due to underlying cardiac condition     successfully rescucitated/ cooled  . CAD (coronary artery disease) 9/13    s/p CABG  . Headache(784.0)   . Renal artery stenosis   . Peripheral arterial disease   . Hypertension   . Cardiac defibrillator in place   . PONV (postoperative nausea and vomiting)   . Spinal headache   . Shortness of breath dyspnea     with exertion  . GERD (gastroesophageal reflux disease)   . Anxiety   . Infection of pacemaker pocket     of ICD with subsequent explant    ROS:   All systems reviewed and negative except as noted in the HPI.   Past Surgical History  Procedure Laterality Date  . Coronary artery bypass graft  06/11/2012    Procedure: CORONARY ARTERY BYPASS GRAFTING (CABG);  Surgeon: [name], MD;  Location: [facility] OR;  Service: Open Heart Surgery;  Laterality: [name];  Coronary Artery Bypass Grafting times four using left internal mammary artery and right greater saphenous vein endoscopically harvested.  [name] Percutaneous placement intravascular stent cervical carotid artery    . Left heart catheterization with coronary angiogram [name] 06/11/2012    Procedure: LEFT  HEART CATHETERIZATION WITH CORONARY ANGIOGRAM;  Surgeon: [name], MD;  [facility];  Service: Cardiovascular;  Laterality: [name];  . Carotid stent insertion [name] 10/14/2012    Procedure: CAROTID STENT INSERTION;  Surgeon: [name], MD;  [facility];  Service: Cardiovascular;  Laterality: [name];  . Carotid angiogram Bilateral 10/14/2012    Procedure: CAROTID ANGIOGRAM;  Surgeon: [name], MD;  [facility];  Service: Cardiovascular;  Laterality: Bilateral;  . Bi-ventricular implantable cardioverter defibrillator [name] 11/25/2012    Procedure: BI-VENTRICULAR IMPLANTABLE CARDIOVERTER DEFIBRILLATOR  (CRT-D);  Surgeon: [name], MD;  [facility];  Service: Cardiovascular;  Laterality: [name];  . Icd lead removal Left 10/04/2014    Procedure: REMOVAL Saint Jude ICD with lead;  Surgeon: [name], MD;  [facility];  Service: Cardiovascular;  Laterality: Left;  [facility]     Family History  Problem Relation Age of Onset  . Cancer Brother      History   Social History  . Marital Status: Married    Spouse Name: [name]  . Number of Children: [name]  . Years of Education: [name]   Occupational History  . Not on file.   Social History Main Topics  . Smoking status: Former Smoker -- 1.50 packs/day for 40 years    Types: Cigarettes    Quit date: 06/11/2012  . Smokeless tobacco: Never Used  . Alcohol Use: 3.6 - 4.8 oz/week    6-8 Cans of beer per week     Comment: occasional  . Drug Use: No  . Sexual Activity: Yes   Other Topics Concern  . Not on file   Social History Narrative     BP 142/80 mmHg  Pulse 63  Ht 5\' 10"  (1.778 m)  Wt 165 lb 12.8 oz (75.206 kg)  BMI 23.79 kg/m2  Physical Exam:  Well appearing middle aged man, NAD HEENT: Unremarkable Neck:  No JVD, no thyromegally Back:  No CVA tenderness Lungs:  Clear with no wheezes HEART:  Regular rate rhythm, no murmurs, no rubs, no clicks Abd:  soft, positive bowel sounds, no organomegally, no rebound, no guarding Ext:  2 plus pulses, no edema, no cyanosis, no clubbing Skin:  No rashes no nodules Neuro:  CN II through XII intact, motor grossly intact  EKG - nsr with LBBB  Assess/Plan:

## 2015-03-24 NOTE — Interval H&P Note (Cosign Needed)
History and Physical Interval Note:  03/24/2015 10:14 AM  Jonathon Ryan  has presented today for surgery, with the diagnosis of acm  The various methods of treatment have been discussed with the patient and family. After consideration of risks, benefits and other options for treatment, the patient has consented to  Procedure(s): SubQ ICD Implant ([name]) as a surgical intervention .  The patient's history has been reviewed, patient examined, no change in status, stable for surgery.  I have reviewed the patient's chart and labs.  Questions were answered to the patient's satisfaction.     Lewayne Bunting

## 2015-03-24 NOTE — H&P (Signed)
  ICD Criteria  Current LVEF:25% ;Obtained > or = 1 month ago and < or = 3 months ago.  NYHA Functional Classification: Class II  Heart Failure History:  Yes, Duration of heart failure since onset is > 9 months  Non-Ischemic Dilated Cardiomyopathy History:  No.  Atrial Fibrillation/Atrial Flutter:  No.  Ventricular Tachycardia History:  Yes, Hemodynamic instability present, VT Type:  SVT - Polymorphic.  Cardiac Arrest History:  Yes, This was a Ventricular Tachycardia/Ventricular Fibrillation Arrest. This was NOT a bradycardia arrest.  History of Syndromes with Risk of Sudden Death:  No.  Previous ICD:  Yes, ICD Type:  CRT-D, Reason for ICD:  Secondary, reason for secondary prevention:  Cardiac Arrest.  Electrophysiology Study: No.  Prior MI: Yes, Most recent MI timeframe is > 40 days.  PPM: No.  OSA:  No  Patient Life Expectancy of >=1 year: Yes.  Anticoagulation Therapy:  Patient is NOT on anticoagulation therapy.   Beta Blocker Therapy:  Yes.   Ace Inhibitor/ARB Therapy:  Yes.

## 2015-03-24 NOTE — Transfer of Care (Signed)
Immediate Anesthesia Transfer of Care Note  Patient: Jonathon Ryan  Procedure(s) Performed: Procedure(s): SubQ ICD Implant ([name])  Patient Location: PACU and Cath Lab  Anesthesia Type:MAC  Level of Consciousness: awake, alert , oriented and patient cooperative  Airway & Oxygen Therapy: Patient Spontanous Breathing and Patient connected to nasal cannula oxygen  Post-op Assessment: Report given to RN, Post -op Vital signs reviewed and stable and Patient moving all extremities  Post vital signs: Reviewed and stable  Last Vitals:  Filed Vitals:   03/24/15 0821  BP: 118/51  Pulse: 48  Temp: 36.8 C  Resp: 17    Complications: No apparent anesthesia complications

## 2015-03-25 ENCOUNTER — Encounter (HOSPITAL_COMMUNITY): Payer: Self-pay | Admitting: Internal Medicine

## 2015-03-25 DIAGNOSIS — I2510 Atherosclerotic heart disease of native coronary artery without angina pectoris: Secondary | ICD-10-CM | POA: Diagnosis not present

## 2015-03-25 DIAGNOSIS — I255 Ischemic cardiomyopathy: Secondary | ICD-10-CM | POA: Diagnosis not present

## 2015-03-25 DIAGNOSIS — I5022 Chronic systolic (congestive) heart failure: Secondary | ICD-10-CM | POA: Diagnosis not present

## 2015-03-25 DIAGNOSIS — Z951 Presence of aortocoronary bypass graft: Secondary | ICD-10-CM | POA: Diagnosis not present

## 2015-03-25 MED ORDER — OXYCODONE-ACETAMINOPHEN 5-325 MG PO TABS: 1.0000 | ORAL_TABLET | ORAL | Status: DC | PRN

## 2015-03-25 MED FILL — Heparin Sodium (Porcine) 2 Unit/ML in Sodium Chloride 0.9%: INTRAMUSCULAR | Qty: 500 | Status: AC

## 2015-03-25 NOTE — Discharge Instructions (Signed)
Keep incisions clean and dry for 1 week. Call for redness, swelling, drainage, fever

## 2015-03-25 NOTE — Discharge Summary (Signed)
ELECTROPHYSIOLOGY PROCEDURE DISCHARGE SUMMARY    Patient ID: Jonathon Ryan,  MRN: [id number], DOB/AGE: December 11, 1955 59 y.o.  Admit date: 03/24/2015 Discharge date: 03/25/2015  Primary Care Physician: Josue Hector, MD Primary Cardiologist: [name] Electrophysiologist: Allred  Primary Discharge Diagnosis:  Ischemic cardiomyopathy with prior aborted sudden cardiac arrest status post ICD implantation this admission  Secondary Discharge Diagnosis:  1.  Prior ICD system infection s/p extraction 2.  CAD s/p CABG 3.  Hyperlipidemia 4.  LBBB 5.  RAS  No Known Allergies   Procedures This Admission:  1.  Implantation of a BSX S-ICD on 03/24/15 by Dr Ladona Ridgel.  See op note for full details.  DFT's were successful at 64 J.  There were no immediate post procedure complications.  Brief HPI: Jonathon Ryan is a 59 y.o. male with a past medical history as outlined above.  He developed ICD pocket infection and underwent extraction.  Because of prior infection and no benefit with CRT, it was elected to place S-ICD.   Risks, benefits, and alternatives to ICD implantation were reviewed with the patient who wished to proceed.   Hospital Course:  The patient was admitted and underwent implantation of a BSX S-ICD with details as outlined above. He was monitored on telemetry overnight which demonstrated sinus bradycardia.  Left chest was without hematoma or ecchymosis.  The device was interrogated and found to be functioning normally.  Wound care and restrictions were reviewed with the patient.  The patient was examined and considered stable for discharge to home.   The patient's discharge medications include an ACE-I (Lisinopril) and beta blocker (Coreg).   Physical Exam: Filed Vitals:   03/24/15 1745 03/24/15 2043 03/25/15 0400 03/25/15 0514  BP: 116/51  103/54 113/77  Pulse: 47  52 55  Temp:  98.4 F (36.9 C)  98.1 F (36.7 C)  TempSrc:  Oral  Oral  Resp: Height:      (1.778 m)  Weight:    164 lb 3.2 oz (74.481 kg)  SpO2: 98%  95% 99%    GEN- The patient is well appearing, alert and oriented x 3 today.   HEENT: normocephalic, atraumatic; sclera clear, conjunctiva pink; hearing intact; oropharynx clear; neck supple, no JVP Lymph- no cervical lymphadenopathy Lungs- Clear to ausculation bilaterally, normal work of breathing.  No wheezes, rales, rhonchi Heart- Regular rate and rhythm, no murmurs, rubs or gallops  GI- soft, non-tender, non-distended, bowel sounds present  Extremities- no clubbing, cyanosis, or edema; DP/PT/radial pulses 2+ bilaterally MS- no significant deformity or atrophy Skin- warm and dry, no rash or lesion, left axillae without hematoma/ecchymosis Psych- euthymic mood, full affect Neuro- strength and sensation are intact   Labs:   Lab Results  Component Value Date   WBC 6.6 03/18/2015   HGB 13.7 03/18/2015   HCT 40.5 03/18/2015   MCV 85.7 03/18/2015   PLT 333.0 03/18/2015     Recent Labs Lab 03/18/15 0855  NA 137  K 4.3  CL 104  CO2 27  BUN 16  CREATININE 1.04  CALCIUM 10.1  GLUCOSE 94    Discharge Medications:    Medication List    TAKE these medications        aspirin 81 MG tablet  Take 81 mg by mouth daily.     atorvastatin 80 MG tablet  Commonly known as:  LIPITOR  Take 80 mg by mouth daily.     carvedilol 25 MG tablet  Commonly  known as:  COREG  Take 0.5 tablets (12.5 mg total) by mouth 2 (two) times daily with a meal.     clopidogrel 75 MG tablet  Commonly known as:  PLAVIX  Take 1 tablet (75 mg total) by mouth daily. with food     fenofibrate 145 MG tablet  Commonly known as:  TRICOR  Take 1 tablet (145 mg total) by mouth daily.     Fish Oil 1000 MG Caps  Take 1,000 mg by mouth daily.     lisinopril 20 MG tablet  Commonly known as:  PRINIVIL,ZESTRIL  Take 10 mg by mouth daily.     LORazepam 0.5 MG tablet  Commonly known as:  ATIVAN  Take 0.5 mg by mouth 3 (three) times daily.      omeprazole 40 MG capsule  Commonly known as:  PRILOSEC  Take 40 mg by mouth daily.     oxyCODONE-acetaminophen 5-325 MG per tablet  Commonly known as:  PERCOCET/ROXICET  Take 1 tablet by mouth every 4 (four) hours as needed for severe pain.        Disposition:  Discharge Instructions    Diet - low sodium heart healthy    Complete by:  As directed      Increase activity slowly    Complete by:  As directed           Follow-up Information    Follow up with [facility] On 04/07/2015.   Why:  at 11AM for wound check   Contact information:   [address] [place] [address]       Follow up with Hillis Range, MD On 07/15/2015.   Specialty:  Cardiology   Why:  at 11:30AM   Contact information:   [address] [place] [address] [phone]       Duration of Discharge Encounter: Greater than 30 minutes including physician time.  Signed, Gypsy Balsam, NP 03/25/2015 8:53 AM     EP Attending  Patient seen and examined. Agree with above. Ok for discharge.  Leonia Reeves.D.

## 2015-04-05 ENCOUNTER — Telehealth (HOSPITAL_COMMUNITY): Payer: Self-pay | Admitting: *Deleted

## 2015-04-06 ENCOUNTER — Telehealth (HOSPITAL_COMMUNITY): Payer: Self-pay | Admitting: *Deleted

## 2015-04-07 ENCOUNTER — Ambulatory Visit (INDEPENDENT_AMBULATORY_CARE_PROVIDER_SITE_OTHER): Payer: Medicare Other | Admitting: *Deleted

## 2015-04-07 DIAGNOSIS — I255 Ischemic cardiomyopathy: Secondary | ICD-10-CM | POA: Diagnosis not present

## 2015-04-07 DIAGNOSIS — Z8674 Personal history of sudden cardiac arrest: Secondary | ICD-10-CM

## 2015-04-07 LAB — CUP PACEART INCLINIC DEVICE CHECK
Battery Remaining Percentage: 100 %
Date Time Interrogation Session: 20160630131413
Pulse Gen Serial Number: 116179

## 2015-04-07 NOTE — Progress Notes (Signed)
Wound check s/p subcutaneous ICD implant. Wound well healed without redness or edema. Incision edges approximated. 0 untreated episodes; 0 treated episodes; 0 shocks delivered. Electrode impedance status okay. No programming changes. Remaining longevity to ERI 100%. Patient educated about wound care and shock plan. Plan to follow up w/ [facility] on 9/21 @ 0945.

## 2015-04-13 ENCOUNTER — Telehealth (HOSPITAL_COMMUNITY): Payer: Self-pay | Admitting: *Deleted

## 2015-04-13 ENCOUNTER — Other Ambulatory Visit: Payer: Self-pay | Admitting: Cardiovascular Disease

## 2015-04-13 DIAGNOSIS — I6529 Occlusion and stenosis of unspecified carotid artery: Secondary | ICD-10-CM

## 2015-04-19 ENCOUNTER — Encounter: Payer: Self-pay | Admitting: Internal Medicine

## 2015-05-03 ENCOUNTER — Ambulatory Visit (HOSPITAL_COMMUNITY)
Admission: RE | Admit: 2015-05-03 | Discharge: 2015-05-03 | Disposition: A | Discharge status: Home / Self Care | Payer: Medicare Other | Source: Ambulatory Visit | Attending: Cardiology | Admitting: Cardiology

## 2015-05-03 DIAGNOSIS — I6523 Occlusion and stenosis of bilateral carotid arteries: Secondary | ICD-10-CM | POA: Diagnosis not present

## 2015-05-03 DIAGNOSIS — I6529 Occlusion and stenosis of unspecified carotid artery: Secondary | ICD-10-CM | POA: Diagnosis present

## 2015-05-10 ENCOUNTER — Encounter: Payer: Self-pay | Admitting: *Deleted

## 2015-06-14 ENCOUNTER — Encounter (INDEPENDENT_AMBULATORY_CARE_PROVIDER_SITE_OTHER): Payer: Self-pay | Admitting: *Deleted

## 2015-06-23 ENCOUNTER — Encounter (INDEPENDENT_AMBULATORY_CARE_PROVIDER_SITE_OTHER): Payer: Self-pay | Admitting: *Deleted

## 2015-06-23 ENCOUNTER — Other Ambulatory Visit (INDEPENDENT_AMBULATORY_CARE_PROVIDER_SITE_OTHER): Payer: Self-pay | Admitting: *Deleted

## 2015-06-23 DIAGNOSIS — Z1211 Encounter for screening for malignant neoplasm of colon: Secondary | ICD-10-CM

## 2015-06-29 ENCOUNTER — Encounter: Payer: Medicare Other | Admitting: Internal Medicine

## 2015-06-29 ENCOUNTER — Ambulatory Visit (INDEPENDENT_AMBULATORY_CARE_PROVIDER_SITE_OTHER): Payer: Medicare Other | Admitting: Internal Medicine

## 2015-06-29 ENCOUNTER — Encounter: Payer: Self-pay | Admitting: Internal Medicine

## 2015-06-29 VITALS — BP 118/72 | HR 53 | Ht 70.0 in | Wt 153.2 lb

## 2015-06-29 DIAGNOSIS — Z95810 Presence of automatic (implantable) cardiac defibrillator: Secondary | ICD-10-CM | POA: Diagnosis not present

## 2015-06-29 DIAGNOSIS — I255 Ischemic cardiomyopathy: Secondary | ICD-10-CM | POA: Diagnosis not present

## 2015-06-29 DIAGNOSIS — Z8674 Personal history of sudden cardiac arrest: Secondary | ICD-10-CM | POA: Diagnosis not present

## 2015-06-29 DIAGNOSIS — I10 Essential (primary) hypertension: Secondary | ICD-10-CM

## 2015-06-29 DIAGNOSIS — E785 Hyperlipidemia, unspecified: Secondary | ICD-10-CM

## 2015-06-29 LAB — CUP PACEART INCLINIC DEVICE CHECK
Battery Remaining Percentage: 98 %
Date Time Interrogation Session: 20160921102058
Pulse Gen Serial Number: 116179

## 2015-06-29 NOTE — Progress Notes (Signed)
HPI [name] returns today for followup. He is a pleasant 59 yo man with a primary VF arrest, CAD, LBBB, s/p ICD implant who developed an ICD infection several months ago and underwent extraction. In the interim he has been stable and undergone an insertion of a subcutaneous ICD. He denies chest pain or sob. No ICD shocks. No trouble healing. No Known Allergies   Current Outpatient Prescriptions  Medication Sig Dispense Refill  . aspirin 81 MG tablet Take 81 mg by mouth daily.    [name] atorvastatin (LIPITOR) 80 MG tablet Take 80 mg by mouth daily.    . carvedilol (COREG) 25 MG tablet Take 0.5 tablets (12.5 mg total) by mouth 2 (two) times daily with a meal. 30 tablet 9  . clopidogrel (PLAVIX) 75 MG tablet Take 1 tablet (75 mg total) by mouth daily. with food 30 tablet 8  . fenofibrate (TRICOR) 145 MG tablet Take 1 tablet (145 mg total) by mouth daily. 30 tablet 9  . lisinopril (PRINIVIL,ZESTRIL) 20 MG tablet Take 10 mg by mouth daily.    [name] LORazepam (ATIVAN) 0.5 MG tablet Take 0.5 mg by mouth 3 (three) times daily.     . Omega-3 Fatty Acids (FISH OIL) 1000 MG CAPS Take 1,000 mg by mouth daily.    [name] omeprazole (PRILOSEC) 40 MG capsule Take 40 mg by mouth daily.     [name] oxyCODONE-acetaminophen (PERCOCET/ROXICET) 5-325 MG per tablet Take 1 tablet by mouth every 4 (four) hours as needed for severe pain. 10 tablet 0   No current facility-administered medications for this visit.     Past Medical History  Diagnosis Date  . Chronic systolic dysfunction of left ventricle   . Hyperlipidemia   . Cerebrovascular disease 10/15/2012  . Left bundle branch block   . Ischemic cardiomyopathy     s/p BiV ICD implant Feb 2014 - [address]. [name] [place], [place] [address]RU-04 (serial # [id number]) right atrial lead and a [name] [place], model 5409-81X (serial number [id number]) right ventricular defibrillator lead. [name] Quartet model [phone] (serial number [id number]) LV lead. St. Jude  Medical Quadra Assura model [place] 2956 [id number] (serial Number [id number]) biventricular ICD.   [name] Cardiac arrest due to underlying cardiac condition     successfully rescucitated/ cooled  . CAD (coronary artery disease) 9/13    s/p CABG  . Headache(784.0)   . Renal artery stenosis   . Peripheral arterial disease   . Hypertension   . Cardiac defibrillator in place   . PONV (postoperative nausea and vomiting)   . Spinal headache   . Shortness of breath dyspnea     with exertion  . GERD (gastroesophageal reflux disease)   . Anxiety   . Infection of pacemaker pocket     of ICD with subsequent explant    ROS:   All systems reviewed and negative except as noted in the HPI.   Past Surgical History  Procedure Laterality Date  . Coronary artery bypass graft  06/11/2012    Procedure: CORONARY ARTERY BYPASS GRAFTING (CABG);  Surgeon: [name], MD;  Location: [facility] OR;  Service: Open Heart Surgery;  Laterality: [name];  Coronary Artery Bypass Grafting times four using left internal mammary artery and right greater saphenous vein endoscopically harvested.  [name] Percutaneous placement intravascular stent cervical carotid artery    . Left heart catheterization with coronary angiogram [name] 06/11/2012    Procedure: LEFT HEART CATHETERIZATION WITH CORONARY ANGIOGRAM;  Surgeon: Runell Gess, MD;  [facility] C[facility] LAB;  Service: Cardiovascular;  Laterality: [name];  . Carotid stent insertion [name] 10/14/2012    Procedure: CAROTID STENT INSERTION;  Surgeon: Nada Libman, MD;  [facility] CATH LAB;  Service: Cardiovascular;  Laterality: [name];  . Carotid angiogram Bilateral 10/14/2012    Procedure: CAROTID ANGIOGRAM;  Surgeon: Nada Libman, MD;  [facility] C[facility] LAB;  Service: Cardiovascular;  Laterality: Bilateral;  . Bi-ventricular implantable cardioverter defibrillator [name] 11/25/2012    Procedure: BI-VENTRICULAR IMPLANTABLE CARDIOVERTER DEFIBRILLATOR  (CRT-D);  Surgeon: Hillis Range, MD;  Location: [facility] LAB;   Service: Cardiovascular;  Laterality: [name];  . Icd lead removal Left 10/04/2014    Procedure: REMOVAL Saint Jude ICD with lead;  Surgeon: Marinus Maw, MD;  Location: [facility] OR;  Service: Cardiovascular;  Laterality: Left;  [name] is back up for [employer]  . Ep implantable device [name] 03/24/2015    Procedure: SubQ ICD Implant;  Surgeon: Marinus Maw, MD;  Location: [facility] INVASIVE CV LAB;  Service: Cardiovascular;  Laterality: [name];     Family History  Problem Relation Age of Onset  . Cancer Brother      Social History   Social History  . Marital Status: Married    Spouse Name: [name]  . Number of Children: [name]  . Years of Education: [name]   Occupational History  . Not on file.   Social History Main Topics  . Smoking status: Former Smoker -- 1.50 packs/day for 40 years    Types: Cigarettes    Quit date: 06/11/2012  . Smokeless tobacco: Never Used  . Alcohol Use: 3.6 - 4.8 oz/week    6-8 Cans of beer per week     Comment: occasional  . Drug Use: No  . Sexual Activity: Yes   Other Topics Concern  . Not on file   Social History Narrative     BP 118/72 mmHg  Pulse 53  Ht  (1.778 m)  Wt 153 lb 3.2 oz (69.491 kg)  BMI 21.98 kg/m2  SpO2 99%  Physical Exam:  Well appearing middle aged man, NAD HEENT: Unremarkable Neck:  No JVD, no thyromegally Back:  No CVA tenderness Lungs:  Clear with no wheezes, his chest and left-sided incisions are well-healed. HEART:  Regular rate rhythm, no murmurs, no rubs, no clicks Abd:  soft, positive bowel sounds, no organomegally, no rebound, no guarding Ext:  2 plus pulses, no edema, no cyanosis, no clubbing Skin:  No rashes no nodules Neuro:  CN II through XII intact, motor grossly intact  EKG - nsr with LBBB  Assess/Plan:

## 2015-06-29 NOTE — Patient Instructions (Signed)
Your physician wants you to follow-up in: 9 Months with [name]. You will receive a reminder letter in the mail two months in advance. If you don't receive a letter, please call our office to schedule the follow-up appointment.  Remote monitoring is used to monitor your Pacemaker of ICD from home. This monitoring reduces the number of office visits required to check your device to one time per year. It allows [place] to keep an eye on the functioning of your device to ensure it is working properly. You are scheduled for a device check from home on 09/28/15. You may send your transmission at any time that day. If you have a wireless device, the transmission will be sent automatically. After your physician reviews your transmission, you will receive a postcard with your next transmission date.  Your physician recommends that you continue on your current medications as directed. Please refer to the Current Medication list given to you today.  Thank you for choosing [facility]!

## 2015-06-29 NOTE — Assessment & Plan Note (Signed)
He denies anginal symptoms. He will continue his current medical therapy. 

## 2015-06-29 NOTE — Assessment & Plan Note (Signed)
He is doing well with no recurrent ventricular arrhythmias. His subcutaneous ICD has healed nicely and is working appropriately.

## 2015-06-29 NOTE — Assessment & Plan Note (Signed)
He is encouraged to maintain a low-fat diet. He will continue his current medications.

## 2015-06-29 NOTE — Assessment & Plan Note (Signed)
His blood pressure is well controlled. He will maintain a low-sodium diet. He will continue his current medications.

## 2015-07-14 ENCOUNTER — Telehealth (INDEPENDENT_AMBULATORY_CARE_PROVIDER_SITE_OTHER): Payer: Self-pay | Admitting: *Deleted

## 2015-07-14 ENCOUNTER — Telehealth: Payer: Self-pay | Admitting: Cardiovascular Disease

## 2015-07-14 DIAGNOSIS — Z1211 Encounter for screening for malignant neoplasm of colon: Secondary | ICD-10-CM

## 2015-07-14 NOTE — Telephone Encounter (Signed)
Pt is having a colonoscopy on 08-18-15.She wants to know if he can stop his Plavix 5 days before and his aspirin 2 days before?

## 2015-07-14 NOTE — Telephone Encounter (Signed)
Requesting surgical clearance:   1. Type of surgery: Colonscopy  2. Surgeon: Dr. Karilyn Cota  3. Surgical date: 08/18/2015  4. Medications that need to be held: Plavix 5 days before and ASA 2 days before  5. CAD: yes     6. I will defer to: Dr. [name]

## 2015-07-14 NOTE — Telephone Encounter (Signed)
Routed to Dr. Rehman's nurse 

## 2015-07-14 NOTE — Telephone Encounter (Signed)
Can interrupt anti-platelet  therapy for colonoscopy

## 2015-07-14 NOTE — Telephone Encounter (Signed)
Patient needs suprep 

## 2015-07-15 ENCOUNTER — Encounter: Payer: Medicare Other | Admitting: Internal Medicine

## 2015-07-15 MED ORDER — SUPREP BOWEL PREP KIT 17.5-3.13-1.6 GM/177ML PO SOLN: 1.0000 | Freq: Once | ORAL | Status: DC

## 2015-07-15 NOTE — Telephone Encounter (Signed)
Patient aware.

## 2015-07-20 ENCOUNTER — Telehealth (INDEPENDENT_AMBULATORY_CARE_PROVIDER_SITE_OTHER): Payer: Self-pay | Admitting: *Deleted

## 2015-07-20 NOTE — Telephone Encounter (Signed)
Referring MD/PCP: nyland   Procedure: tcs  Reason/Indication:  screening  Has patient had this procedure before?  Yes, 10 yrs ago  If so, when, by whom and where?    Is there a family history of colon cancer?  no  Who?  What age when diagnosed?    Is patient diabetic?   no      Does patient have prosthetic heart valve?  no  Do you have a pacemaker?  No -- has defibrillator  Has patient ever had endocarditis? no  Has patient had joint replacement within last 12 months?  no  Does patient tend to be constipated or take laxatives? no  Does patient have a history of alcohol/drug use? no  Is patient on Coumadin, Plavix and/or Aspirin? yes  Medications: asa 81 mg daily, lipitor 80 mg daily, coreg 25 mg 1/2 tab bid, plavix 75 mg daily, tricor 245 mg daily, ativan 5 mg tid, lisinopril 20 mg 1/2 tab daily, prilosec 40 mg daily  Allergies: nkda  Medication Adjustment: asa 2 days, plavix 5 days  Procedure date & time: 08/18/15 at 830

## 2015-07-21 NOTE — Telephone Encounter (Signed)
agree

## 2015-07-27 ENCOUNTER — Encounter: Payer: Self-pay | Admitting: Internal Medicine

## 2015-08-08 ENCOUNTER — Telehealth: Payer: Self-pay | Admitting: Internal Medicine

## 2015-08-08 NOTE — Telephone Encounter (Signed)
New message      Two weeks Jonathon CowerJason order patient a cell adaptor.  He has not received it.  Please let Jonathon CowerJason know this.

## 2015-08-08 NOTE — Telephone Encounter (Signed)
Pt has not received his adapter yet. I text Barbara CowerJason w/ [employer]. Barbara CowerJason will refile paperwork tonight to get pt his adapter.

## 2015-08-18 ENCOUNTER — Encounter (HOSPITAL_COMMUNITY)
Admission: RE | Disposition: A | Discharge status: Home / Self Care | Payer: Self-pay | Source: Ambulatory Visit | Attending: Internal Medicine

## 2015-08-18 ENCOUNTER — Ambulatory Visit (HOSPITAL_COMMUNITY)
Admission: RE | Admit: 2015-08-18 | Discharge: 2015-08-18 | Disposition: A | Discharge status: Home / Self Care | Payer: Medicare Other | Source: Ambulatory Visit | Attending: Internal Medicine | Admitting: Internal Medicine

## 2015-08-18 ENCOUNTER — Encounter (HOSPITAL_COMMUNITY): Payer: Self-pay | Admitting: *Deleted

## 2015-08-18 DIAGNOSIS — K5730 Diverticulosis of large intestine without perforation or abscess without bleeding: Secondary | ICD-10-CM

## 2015-08-18 DIAGNOSIS — K219 Gastro-esophageal reflux disease without esophagitis: Secondary | ICD-10-CM | POA: Diagnosis not present

## 2015-08-18 DIAGNOSIS — E785 Hyperlipidemia, unspecified: Secondary | ICD-10-CM | POA: Insufficient documentation

## 2015-08-18 DIAGNOSIS — Z79899 Other long term (current) drug therapy: Secondary | ICD-10-CM | POA: Diagnosis not present

## 2015-08-18 DIAGNOSIS — I2510 Atherosclerotic heart disease of native coronary artery without angina pectoris: Secondary | ICD-10-CM | POA: Insufficient documentation

## 2015-08-18 DIAGNOSIS — Z951 Presence of aortocoronary bypass graft: Secondary | ICD-10-CM | POA: Diagnosis not present

## 2015-08-18 DIAGNOSIS — Z7902 Long term (current) use of antithrombotics/antiplatelets: Secondary | ICD-10-CM | POA: Diagnosis not present

## 2015-08-18 DIAGNOSIS — Z7982 Long term (current) use of aspirin: Secondary | ICD-10-CM | POA: Insufficient documentation

## 2015-08-18 DIAGNOSIS — Z1211 Encounter for screening for malignant neoplasm of colon: Secondary | ICD-10-CM | POA: Diagnosis not present

## 2015-08-18 DIAGNOSIS — Z87891 Personal history of nicotine dependence: Secondary | ICD-10-CM | POA: Insufficient documentation

## 2015-08-18 DIAGNOSIS — K644 Residual hemorrhoidal skin tags: Secondary | ICD-10-CM | POA: Diagnosis not present

## 2015-08-18 DIAGNOSIS — I10 Essential (primary) hypertension: Secondary | ICD-10-CM | POA: Diagnosis not present

## 2015-08-18 DIAGNOSIS — F419 Anxiety disorder, unspecified: Secondary | ICD-10-CM | POA: Insufficient documentation

## 2015-08-18 DIAGNOSIS — Z95810 Presence of automatic (implantable) cardiac defibrillator: Secondary | ICD-10-CM | POA: Diagnosis not present

## 2015-08-18 DIAGNOSIS — K648 Other hemorrhoids: Secondary | ICD-10-CM | POA: Diagnosis not present

## 2015-08-18 HISTORY — PX: COLONOSCOPY: SHX5424

## 2015-08-18 SURGERY — COLONOSCOPY
Anesthesia: Moderate Sedation

## 2015-08-18 MED ORDER — SODIUM CHLORIDE 0.9 % IV SOLN
INTRAVENOUS | Status: DC
  Administered 2015-08-18: 08:00:00 via INTRAVENOUS

## 2015-08-18 MED ORDER — MIDAZOLAM HCL 5 MG/5ML IJ SOLN
INTRAMUSCULAR | Status: AC
  Filled 2015-08-18: qty 10

## 2015-08-18 MED ORDER — MEPERIDINE HCL 50 MG/ML IJ SOLN
INTRAMUSCULAR | Status: AC
  Filled 2015-08-18: qty 1

## 2015-08-18 MED ORDER — MIDAZOLAM HCL 5 MG/5ML IJ SOLN
INTRAMUSCULAR | Status: DC | PRN
  Administered 2015-08-18 (×3): 2 mg via INTRAVENOUS
  Administered 2015-08-18: 1 mg via INTRAVENOUS

## 2015-08-18 MED ORDER — MEPERIDINE HCL 50 MG/ML IJ SOLN
INTRAMUSCULAR | Status: DC | PRN
  Administered 2015-08-18: 25 mg

## 2015-08-18 NOTE — Discharge Instructions (Signed)
Resume usual medications including Plavix or clopidogrel. High fiber diet. No driving for 24 hours. Next screening exam in 10 years    Colonoscopy, Care After These instructions give you information on caring for yourself after your procedure. Your doctor may also give you more specific instructions. Call your doctor if you have any problems or questions after your procedure. HOME CARE  Do not drive for 24 hours.  Do not sign important papers or use machinery for 24 hours.  You may shower.  You may go back to your usual activities, but go slower for the first 24 hours.  Take rest breaks often during the first 24 hours.  Walk around or use warm packs on your belly (abdomen) if you have belly cramping or gas.  Drink enough fluids to keep your pee (urine) clear or pale yellow.  Resume your normal diet. Avoid heavy or fried foods.  Avoid drinking alcohol for 24 hours or as told by your doctor.  Only take medicines as told by your doctor. If a tissue sample (biopsy) was taken during the procedure:   Do not take aspirin or blood thinners for 7 days, or as told by your doctor.  Do not drink alcohol for 7 days, or as told by your doctor.  Eat soft foods for the first 24 hours. GET HELP IF: You still have a small amount of blood in your poop (stool) 2-3 days after the procedure. GET HELP RIGHT AWAY IF:  You have more than a small amount of blood in your poop.  You see clumps of tissue (blood clots) in your poop.  Your belly is puffy (swollen).  You feel sick to your stomach (nauseous) or throw up (vomit).  You have a fever.  You have belly pain that gets worse and medicine does not help. MAKE SURE YOU:  Understand these instructions.  Will watch your condition.  Will get help right away if you are not doing well or get worse.   This information is not intended to replace advice given to you by your health care provider. Make sure you discuss any questions you have  with your health care provider.   Document Released: 10/27/2010 Document Revised: 09/29/2013 Document Reviewed: 06/01/2013 Elsevier Interactive Patient Education 2016 Elsevier Inc.  High-Fiber Diet Fiber, also called dietary fiber, is a type of carbohydrate found in fruits, vegetables, whole grains, and beans. A high-fiber diet can have many health benefits. Your health care provider may recommend a high-fiber diet to help:  Prevent constipation. Fiber can make your bowel movements more regular.  Lower your cholesterol.  Relieve hemorrhoids, uncomplicated diverticulosis, or irritable bowel syndrome.  Prevent overeating as part of a weight-loss plan.  Prevent heart disease, type 2 diabetes, and certain cancers. WHAT IS MY PLAN? The recommended daily intake of fiber includes:  38 grams for men under age 60.  30 grams for men over age 60.  25 grams for women under age 60.  21 grams for women over age 60. You can get the recommended daily intake of dietary fiber by eating a variety of fruits, vegetables, grains, and beans. Your health care provider may also recommend a fiber supplement if it is not possible to get enough fiber through your diet. WHAT DO I NEED TO KNOW ABOUT A HIGH-FIBER DIET?  Fiber supplements have not been widely studied for their effectiveness, so it is better to get fiber through food sources.  Always check the fiber content on thenutrition facts label of any  prepackaged food. Look for foods that contain at least 5 grams of fiber per serving.  Ask your dietitian if you have questions about specific foods that are related to your condition, especially if those foods are not listed in the following section.  Increase your daily fiber consumption gradually. Increasing your intake of dietary fiber too quickly may cause bloating, cramping, or gas.  Drink plenty of water. Water helps you to digest fiber. WHAT FOODS CAN I EAT? Grains Whole-grain breads. Multigrain  cereal. Oats and oatmeal. Brown rice. Barley. Bulgur wheat. Millet. Bran muffins. Popcorn. Rye wafer crackers. Vegetables Sweet potatoes. Spinach. Kale. Artichokes. Cabbage. Broccoli. Green peas. Carrots. Squash. Fruits Berries. Pears. Apples. Oranges. Avocados. Prunes and raisins. Dried figs. Meats and Other Protein Sources Navy, kidney, pinto, and soy beans. Split peas. Lentils. Nuts and seeds. Dairy Fiber-fortified yogurt. Beverages Fiber-fortified soy milk. Fiber-fortified orange juice. Other Fiber bars. The items listed above may not be a complete list of recommended foods or beverages. Contact your dietitian for more options. WHAT FOODS ARE NOT RECOMMENDED? Grains White bread. Pasta made with refined flour. White rice. Vegetables Fried potatoes. Canned vegetables. Well-cooked vegetables.  Fruits Fruit juice. Cooked, strained fruit. Meats and Other Protein Sources Fatty cuts of meat. Fried Environmental education officer or fried fish. Dairy Milk. Yogurt. Cream cheese. Sour cream. Beverages Soft drinks. Other Cakes and pastries. Butter and oils. The items listed above may not be a complete list of foods and beverages to avoid. Contact your dietitian for more information. WHAT ARE SOME TIPS FOR INCLUDING HIGH-FIBER FOODS IN MY DIET?  Eat a wide variety of high-fiber foods.  Make sure that half of all grains consumed each day are whole grains.  Replace breads and cereals made from refined flour or white flour with whole-grain breads and cereals.  Replace white rice with brown rice, bulgur wheat, or millet.  Start the day with a breakfast that is high in fiber, such as a cereal that contains at least 5 grams of fiber per serving.  Use beans in place of meat in soups, salads, or pasta.  Eat high-fiber snacks, such as berries, raw vegetables, nuts, or popcorn.   This information is not intended to replace advice given to you by your health care provider. Make sure you discuss any questions you  have with your health care provider.   Document Released: 09/24/2005 Document Revised: 10/15/2014 Document Reviewed: 03/09/2014 Elsevier Interactive Patient Education [employer]

## 2015-08-18 NOTE — H&P (Signed)
Jonathon Ryan is an 59 y.o. male.   Chief Complaint: Patient is here for colonoscopy. HPI: Patient is 59 year old Caucasian male with multiple medical problems who is doing well and is here for screening colonoscopy. Last exam was 10 years ago. He denies abdominal pain change in bowel habits or rectal bleeding. Family history is negative for CRC.  Past Medical History  Diagnosis Date  . Chronic systolic dysfunction of left ventricle   . Hyperlipidemia   . Cerebrovascular disease 10/15/2012  . Left bundle branch block   . Ischemic cardiomyopathy     s/p BiV ICD implant Feb 2014 - [place], [place] 2088TC-52 (serial # [id number]) right atrial lead and a [place], model 7122-65Q (serial number [id number]) right ventricular defibrillator lead. [place] - 86 (serial number [id number]) LV lead. [place] 3365 [phone] (serial Number [id number]) biventricular ICD.   [name] Cardiac arrest due to underlying cardiac condition ([place])     successfully rescucitated/ cooled  . CAD (coronary artery disease) 9/13    s/p CABG  . Headache(784.0)   . Renal artery stenosis ([place])   . Peripheral arterial disease ([place])   . Hypertension   . Cardiac defibrillator in place   . PONV (postoperative nausea and vomiting)   . Spinal headache   . Shortness of breath dyspnea     with exertion  . GERD (gastroesophageal reflux disease)   . Anxiety   . Infection of pacemaker pocket [facility])     of ICD with subsequent explant    Past Surgical History  Procedure Laterality Date  . Coronary artery bypass graft  06/11/2012    Procedure: CORONARY ARTERY BYPASS GRAFTING (CABG);  Surgeon: Jonathon Poot, MD;  Location: [place];  Service: Open Heart Surgery;  Laterality: [name];  Coronary Artery Bypass Grafting times four using left internal mammary artery and right greater saphenous vein endoscopically harvested.  [name] Percutaneous placement intravascular stent  cervical carotid artery    . Left heart catheterization with coronary angiogram [name] 06/11/2012    Procedure: LEFT HEART CATHETERIZATION WITH CORONARY ANGIOGRAM;  Surgeon: Jonathon Harp, MD;  [facility] C[facility] LAB;  Service: Cardiovascular;  Laterality: [name];  . Carotid stent insertion [name] 10/14/2012    Procedure: CAROTID STENT INSERTION;  Surgeon: Jonathon Mitchell, MD;  Location: [facility] CATH LAB;  Service: Cardiovascular;  Laterality: [name];  . Carotid angiogram Bilateral 10/14/2012    Procedure: CAROTID ANGIOGRAM;  Surgeon: Jonathon Mitchell, MD;  Location: [facility] CATH LAB;  Service: Cardiovascular;  Laterality: Bilateral;  . Bi-ventricular implantable cardioverter defibrillator [name] 11/25/2012    Procedure: BI-VENTRICULAR IMPLANTABLE CARDIOVERTER DEFIBRILLATOR  (CRT-D);  Surgeon: Jonathon Grayer, MD;  Location: [facility] CATH LAB;  Service: Cardiovascular;  Laterality: [name];  . Icd lead removal Left 10/04/2014    Procedure: [place] with lead;  Surgeon: Jonathon Lance, MD;  Location: [place];  Service: Cardiovascular;  Laterality: Left;  Jonathon Ryan is back up for [employer]  . Ep implantable device [name] 03/24/2015    Procedure: SubQ ICD Implant;  Surgeon: Jonathon Lance, MD;  Location: [place] CV LAB;  Service: Cardiovascular;  Laterality: [name];    Family History  Problem Relation Age of Onset  . Cancer Brother    Social History:  reports that he quit smoking about 3 years ago. His smoking use included Cigarettes. He has a 60 pack-year smoking history. He has never used smokeless tobacco.  He reports that he drinks about 8.4 oz of alcohol per week. He reports that he does not use illicit drugs.  Allergies: No Known Allergies  Medications Prior to Admission  Medication Sig Dispense Refill  . atorvastatin (LIPITOR) 80 MG tablet Take 80 mg by mouth daily.    . carvedilol (COREG) 25 MG tablet Take 0.5 tablets (12.5 mg total) by mouth 2 (two) times daily with a meal. 30 tablet 9  . fenofibrate (TRICOR) 145 MG tablet  Take 1 tablet (145 mg total) by mouth daily. 30 tablet 9  . lisinopril (PRINIVIL,ZESTRIL) 20 MG tablet Take 10 mg by mouth daily.    [name] LORazepam (ATIVAN) 0.5 MG tablet Take 0.5 mg by mouth 3 (three) times daily.     [name] omeprazole (PRILOSEC) 40 MG capsule Take 40 mg by mouth daily.     Jonathon Ryan BOWEL PREP SOLN Take 1 kit by mouth once. 1 Bottle 0  . aspirin 81 MG tablet Take 81 mg by mouth daily.    . clopidogrel (PLAVIX) 75 MG tablet Take 1 tablet (75 mg total) by mouth daily. with food 30 tablet 8  . Omega-3 Fatty Acids (FISH OIL) 1000 MG CAPS Take 1,000 mg by mouth daily.    [name] oxyCODONE-acetaminophen (PERCOCET/ROXICET) 5-325 MG per tablet Take 1 tablet by mouth every 4 (four) hours as needed for severe pain. 10 tablet 0    No results found for this or any previous visit (from the past 48 hour(s)). No results found.  ROS  Blood pressure 116/59, pulse 70, temperature 97.9 F (36.6 C), temperature source Oral, resp. rate 15, height 5' 10.5" (1.791 m), weight 158 lb (71.668 kg), SpO2 100 %. Physical Exam  Constitutional:  Well-developed and Caucasian male in NAD.  HENT:  Mouth/Throat: Oropharynx is clear and moist.  Eyes: Conjunctivae are normal. No scleral icterus.  Neck: No thyromegaly present.  Cardiovascular: Normal rate, regular rhythm and normal heart sounds.   No murmur heard. Respiratory: Effort normal and breath sounds normal.  He has AICD device at upper chest below the axilla  GI: He exhibits no distension and no mass. There is no tenderness.  Musculoskeletal: He exhibits no edema.  Lymphadenopathy:    He has no cervical adenopathy.  Neurological: He is alert.  Skin: Skin is warm and dry.     Assessment/Plan Average risk screening colonoscopy  Jonathon Ryan 08/18/2015, 8:32 AM

## 2015-08-18 NOTE — Op Note (Signed)
COLONOSCOPY PROCEDURE REPORT  PATIENT:  Jonathon Ryan  MR#:  [id number] Birthdate:  12/27/1955, 59 y.o., male Endoscopist:  Dr. Malissa HippoNajeeb U. Rehman, MD Referred By:  Dr. Josue HectorLeonard Robert Nyland, MD  Procedure Date: 08/18/2015  Procedure:   Colonoscopy  Indications:  Patient is 59 year old Caucasian male who is undergoing average risk screening colonoscopy. Last exam was over 10 years ago.  Informed Consent:  The procedure and risks were reviewed with the patient and informed consent was obtained.  Medications:  Demerol 25 mg IV Versed 7 mg IV  Description of procedure:  After a digital rectal exam was performed, that colonoscope was advanced from the anus through the rectum and colon to the area of the cecum, ileocecal valve and appendiceal orifice. The cecum was deeply intubated. These structures were well-seen and photographed for the record. From the level of the cecum and ileocecal valve, the scope was slowly and cautiously withdrawn. The mucosal surfaces were carefully surveyed utilizing scope tip to flexion to facilitate fold flattening as needed. The scope was pulled down into the rectum where a thorough exam including retroflexion was performed.  Findings:   Prep satisfactory. Normal mucosa of cecum, ascending colon, attic flexure, transverse colon and ascending colon. Scattered diverticula at sigmoid colon. Normal rectal mucosa. Small hemorrhoids below the dentate line and thickened anoderm.    Therapeutic/Diagnostic Maneuvers Performed:  None  Complications:  None  EBL: None  Cecal Withdrawal Time:  10 minutes  Impression:  Examination performed to cecum. Mild sigmoid colon diverticulosis and external hemorrhoids. No evidence of colonic polyps.  Recommendations:  Standard instructions given. High fiber diet. Next screening exam in 10 years.  REHMAN,NAJEEB U  08/18/2015 9:10 AM  CC: Dr. [name],[name], MD & Dr. Bonnetta BarryNo ref. provider found

## 2015-08-23 ENCOUNTER — Encounter (HOSPITAL_COMMUNITY): Payer: Self-pay | Admitting: Internal Medicine

## 2015-08-29 ENCOUNTER — Emergency Department (HOSPITAL_COMMUNITY)
Admission: EM | Admit: 2015-08-29 | Discharge: 2015-08-29 | Disposition: A | Discharge status: Home / Self Care | Payer: Medicare Other | Attending: Emergency Medicine | Admitting: Emergency Medicine

## 2015-08-29 ENCOUNTER — Encounter (HOSPITAL_COMMUNITY): Payer: Self-pay

## 2015-08-29 ENCOUNTER — Emergency Department (HOSPITAL_COMMUNITY): Payer: Medicare Other

## 2015-08-29 DIAGNOSIS — I252 Old myocardial infarction: Secondary | ICD-10-CM | POA: Insufficient documentation

## 2015-08-29 DIAGNOSIS — E785 Hyperlipidemia, unspecified: Secondary | ICD-10-CM | POA: Insufficient documentation

## 2015-08-29 DIAGNOSIS — I10 Essential (primary) hypertension: Secondary | ICD-10-CM | POA: Insufficient documentation

## 2015-08-29 DIAGNOSIS — Z87891 Personal history of nicotine dependence: Secondary | ICD-10-CM | POA: Diagnosis not present

## 2015-08-29 DIAGNOSIS — Z8673 Personal history of transient ischemic attack (TIA), and cerebral infarction without residual deficits: Secondary | ICD-10-CM | POA: Insufficient documentation

## 2015-08-29 DIAGNOSIS — Z7982 Long term (current) use of aspirin: Secondary | ICD-10-CM | POA: Diagnosis not present

## 2015-08-29 DIAGNOSIS — Z951 Presence of aortocoronary bypass graft: Secondary | ICD-10-CM | POA: Diagnosis not present

## 2015-08-29 DIAGNOSIS — Z95810 Presence of automatic (implantable) cardiac defibrillator: Secondary | ICD-10-CM | POA: Insufficient documentation

## 2015-08-29 DIAGNOSIS — F419 Anxiety disorder, unspecified: Secondary | ICD-10-CM | POA: Insufficient documentation

## 2015-08-29 DIAGNOSIS — Z79899 Other long term (current) drug therapy: Secondary | ICD-10-CM | POA: Diagnosis not present

## 2015-08-29 DIAGNOSIS — M545 Low back pain: Secondary | ICD-10-CM | POA: Diagnosis present

## 2015-08-29 DIAGNOSIS — Z7902 Long term (current) use of antithrombotics/antiplatelets: Secondary | ICD-10-CM | POA: Diagnosis not present

## 2015-08-29 DIAGNOSIS — I2510 Atherosclerotic heart disease of native coronary artery without angina pectoris: Secondary | ICD-10-CM | POA: Insufficient documentation

## 2015-08-29 DIAGNOSIS — M5442 Lumbago with sciatica, left side: Secondary | ICD-10-CM | POA: Diagnosis not present

## 2015-08-29 DIAGNOSIS — K219 Gastro-esophageal reflux disease without esophagitis: Secondary | ICD-10-CM | POA: Diagnosis not present

## 2015-08-29 MED ORDER — HYDROMORPHONE HCL 2 MG/ML IJ SOLN
2.0000 mg | Freq: Once | INTRAMUSCULAR | Status: AC
  Administered 2015-08-29: 2 mg via INTRAMUSCULAR
  Filled 2015-08-29: qty 1

## 2015-08-29 MED ORDER — ONDANSETRON 4 MG PO TBDP
4.0000 mg | ORAL_TABLET | Freq: Once | ORAL | Status: AC
  Administered 2015-08-29: 4 mg via ORAL
  Filled 2015-08-29: qty 1

## 2015-08-29 MED ORDER — CYCLOBENZAPRINE HCL 10 MG PO TABS: 10.0000 mg | ORAL_TABLET | Freq: Two times a day (BID) | ORAL | Status: DC | PRN

## 2015-08-29 MED ORDER — PREDNISONE 10 MG PO TABS: 40.0000 mg | ORAL_TABLET | Freq: Every day | ORAL | Status: DC

## 2015-08-29 MED ORDER — HYDROCODONE-ACETAMINOPHEN 5-325 MG PO TABS: 1.0000 | ORAL_TABLET | Freq: Four times a day (QID) | ORAL | Status: DC | PRN

## 2015-08-29 NOTE — ED Notes (Signed)
Pt complain of severe lower back pain. States he initially hurt his back underpinning a trailer. States he was walking this morning and a pain hit him that brought him to his knees. Pt has TENS on that belongs to his spouse. Also, pt took a pain pill earlier that did not help

## 2015-08-29 NOTE — Discharge Instructions (Signed)
Make appointment to follow a regular doctor. Rest off her feet as much as possible. Take prednisone as directed. Take the hydrocodone as needed for pain relief. Take the Flexeril as well. Return for any new or worse symptoms. X-rays today show no evidence of any bony injury. Normally the next step would be MRI but you have a defibrillator pacemaker which may prohibit that from occurring.

## 2015-08-29 NOTE — ED Provider Notes (Signed)
CSN: [id number]     Arrival date & time 08/29/15  1236 History   First MD Initiated Contact with Patient 08/29/15 1239     Chief Complaint  Patient presents with  . Back Pain     (Consider location/radiation/quality/duration/timing/severity/associated sxs/prior Treatment) Patient is a 59 y.o. male presenting with back pain. The history is provided by the patient.  Back Pain Associated symptoms: numbness   Associated symptoms: no abdominal pain, no chest pain, no dysuria, no fever and no weakness    patient with complaint of low back pain on and off for 6 weeks. Feels that he strained it while lifting something heavy but no direct trauma to the back. This morning at 9:00 in the morning the pain got significantly worse radiating into the left hip and down the left leg with numbness on the top of his left foot. Patient states pain is 10 out of 10 sharp ache in nature. Made worse by walking.  Past Medical History  Diagnosis Date  . Chronic systolic dysfunction of left ventricle   . Hyperlipidemia   . Cerebrovascular disease 10/15/2012  . Left bundle branch block   . Ischemic cardiomyopathy     s/p BiV ICD implant Feb 2014 - [address]. [name] [place], [place] [address]JX-91 (serial # [id number]) right atrial lead and a [name] [place], model 4782-95A (serial number [id number]) right ventricular defibrillator lead. [name] Quartet model [phone] - 86 (serial number [id number]) LV lead. [name] Quadra Assura model [place] 6578 [phone] (serial Number [id number]) biventricular ICD.   [name] Cardiac arrest due to underlying cardiac condition (HCC)     successfully rescucitated/ cooled  . CAD (coronary artery disease) 9/13    s/p CABG  . Headache(784.0)   . Renal artery stenosis (HCC)   . Peripheral arterial disease (HCC)   . Hypertension   . Cardiac defibrillator in place   . PONV (postoperative nausea and vomiting)   . Spinal headache   . Shortness of breath dyspnea     with exertion  . GERD  (gastroesophageal reflux disease)   . Anxiety   . Infection of pacemaker pocket [facility])     of ICD with subsequent explant   Past Surgical History  Procedure Laterality Date  . Coronary artery bypass graft  06/11/2012    Procedure: CORONARY ARTERY BYPASS GRAFTING (CABG);  Surgeon: [name], MD;  Location: [facility] OR;  Service: Open Heart Surgery;  Laterality: [name];  Coronary Artery Bypass Grafting times four using left internal mammary artery and right greater saphenous vein endoscopically harvested.  [name] Percutaneous placement intravascular stent cervical carotid artery    . Left heart catheterization with coronary angiogram [name] 06/11/2012    Procedure: LEFT HEART CATHETERIZATION WITH CORONARY ANGIOGRAM;  Surgeon: Runell Gess, MD;  Location: [facility] CATH LAB;  Service: Cardiovascular;  Laterality: [name];  . Carotid stent insertion [name] 10/14/2012    Procedure: CAROTID STENT INSERTION;  Surgeon: Nada Libman, MD;  Location: [facility] CATH LAB;  Service: Cardiovascular;  Laterality: [name];  . Carotid angiogram Bilateral 10/14/2012    Procedure: CAROTID ANGIOGRAM;  Surgeon: Nada Libman, MD;  [facility] C[facility] LAB;  Service: Cardiovascular;  Laterality: Bilateral;  . Bi-ventricular implantable cardioverter defibrillator [name] 11/25/2012    Procedure: BI-VENTRICULAR IMPLANTABLE CARDIOVERTER DEFIBRILLATOR  (CRT-D);  Surgeon: Hillis Range, MD;  Location: [facility] CATH LAB;  Service: Cardiovascular;  Laterality: [name];  . Icd lead removal Left 10/04/2014    Procedure: REMOVAL Saint Jude ICD with  lead;  Surgeon: [name], MD;  [facility];  Service: Cardiovascular;  Laterality: Left;  [name] is back up for [employer]  . Ep implantable device [name] 03/24/2015    Procedure: SubQ ICD Implant;  Surgeon: [name], MD;  [facility];  Service: Cardiovascular;  Laterality: [name];  . Colonoscopy [name] 08/18/2015    Procedure: COLONOSCOPY;  Surgeon: Malissa HippoNajeeb U Rehman, MD;  [facility];  Service: Endoscopy;   Laterality: [name];  830   Family History  Problem Relation Age of Onset  . Cancer Brother    Social History  Substance Use Topics  . Smoking status: Former Smoker -- 1.50 packs/day for 40 years    Types: Cigarettes    Quit date: 06/11/2012  . Smokeless tobacco: Never Used  . Alcohol Use: 8.4 oz/week    14 Cans of beer per week    Review of Systems  Constitutional: Negative for fever.  HENT: Negative for congestion.   Eyes: Negative for visual disturbance.  Respiratory: Negative for shortness of breath.   Cardiovascular: Negative for chest pain.  Gastrointestinal: Negative for nausea, vomiting and abdominal pain.  Genitourinary: Negative for dysuria.  Musculoskeletal: Positive for back pain.  Skin: Negative for rash.  Neurological: Positive for numbness. Negative for weakness.  Hematological: Does not bruise/bleed easily.  Psychiatric/Behavioral: Negative for confusion.      Allergies  Review of patient's allergies indicates no known allergies.  Home Medications   Prior to Admission medications   Medication Sig Start Date End Date Taking? Authorizing Provider  aspirin 81 MG tablet Take 81 mg by mouth daily.   Yes Historical Provider, MD  atorvastatin (LIPITOR) 80 MG tablet Take 80 mg by mouth daily.   Yes Historical Provider, MD  capsicum oleoresin (TRIXAICIN) 0.025 % cream Apply 1 application topically 3 (three) times daily.   Yes Historical Provider, MD  carvedilol (COREG) 25 MG tablet Take 0.5 tablets (12.5 mg total) by mouth 2 (two) times daily with a meal. 03/09/15  Yes [name], MD  clopidogrel (PLAVIX) 75 MG tablet Take 1 tablet (75 mg total) by mouth daily. with food 02/22/15  Yes [name], MD  fenofibrate (TRICOR) 145 MG tablet Take 1 tablet (145 mg total) by mouth daily. 09/27/14  Yes [name], MD  HYDROcodone-acetaminophen (NORCO) 10-325 MG tablet Take 1 tablet by mouth every 6 (six) hours as needed for severe pain.   Yes Historical Provider,  MD  lisinopril (PRINIVIL,ZESTRIL) 20 MG tablet Take 10 mg by mouth daily.   Yes Historical Provider, MD  LORazepam (ATIVAN) 0.5 MG tablet Take 0.5 mg by mouth 3 (three) times daily.  10/26/14  Yes Historical Provider, MD  omega-3 acid ethyl esters (LOVAZA) 1 G capsule Take 1 g by mouth daily.   Yes Historical Provider, MD  omeprazole (PRILOSEC) 40 MG capsule Take 40 mg by mouth daily.  12/16/13  Yes Historical Provider, MD  cyclobenzaprine (FLEXERIL) 10 MG tablet Take 1 tablet (10 mg total) by mouth 2 (two) times daily as needed for muscle spasms. 08/29/15   Vanetta MuldersScott Arnetra Terris, MD  HYDROcodone-acetaminophen (NORCO/VICODIN) 5-325 MG tablet Take 1-2 tablets by mouth every 6 (six) hours as needed. 08/29/15   Vanetta MuldersScott Jewels Langone, MD  predniSONE (DELTASONE) 10 MG tablet Take 4 tablets (40 mg total) by mouth daily. 08/29/15   Vanetta MuldersScott Muriel Hannold, MD   BP 139/105 mmHg  Pulse 79  Temp(Src) 97.7 F (36.5 C) (Oral)  Resp 16  Ht 5'  5" (1.651 m)  Wt 71.668 kg  BMI 26.29 kg/m2  SpO2 100% Physical Exam  Constitutional: He is oriented to person, place, and time. He appears well-developed and well-nourished. No distress.  HENT:  Head: Normocephalic and atraumatic.  Mouth/Throat: Oropharynx is clear and moist.  Eyes: Conjunctivae and EOM are normal. Pupils are equal, round, and reactive to light.  Neck: Normal range of motion. Neck supple.  Cardiovascular: Normal rate, regular rhythm and normal heart sounds.   No murmur heard. Pulmonary/Chest: Effort normal and breath sounds normal.  Abdominal: Soft. Bowel sounds are normal. There is no tenderness.  Musculoskeletal: Normal range of motion.  Neurological: He is alert and oriented to person, place, and time. No cranial nerve deficit. He exhibits normal muscle tone. Coordination normal.  Except for numbness on the top of his left foot. Motor strength is normal. Refill is 1 second.  Skin: Skin is warm. No rash noted.  Nursing note and vitals reviewed.   ED Course   Procedures (including critical care time) Labs Review Labs Reviewed - No data to display  Imaging Review Dg Lumbar Spine Complete  08/29/2015  CLINICAL DATA:  Chronic low back pain with radiation and left leg, initial encounter EXAM: LUMBAR SPINE - COMPLETE 4+ VIEW COMPARISON:  None. FINDINGS: Five lumbar type vertebral bodies are well visualized. Vertebral body height is well maintained. No spondylolysis or spondylolisthesis is seen. Diffuse aortic calcifications are noted. IMPRESSION: No acute abnormality noted. Electronically Signed   By: Alcide Clever M.D.   On: 08/29/2015 14:34   I have personally reviewed and evaluated these images and lab results as part of my medical decision-making.   EKG Interpretation None      MDM   Final diagnoses:  Left-sided low back pain with left-sided sciatica   Patient with a history of low back problems on and off for the past several weeks. Starting today with a significant increase in the back pain left side radiating down the left leg into the top of the toes on the left foot. Associated with numbness. No incontinence. Patient had no direct injury to the back. Patient without a history of back problems in the past. Symptoms started about 6 weeks ago. Pain is currently 10 out of 10.  Patient with some improvement with the hydromorphone. X-rays of the low back without any acute bony abnormalities. Patient does have a defibrillator pacemaker site MRI may be prohibitive depending on what type of pacemaker it is. Patient does have family doctor.  We'll treat with prednisone and pain medication and muscle relaxers.    Vanetta Mulders, MD 08/29/15 [phone]

## 2015-08-29 NOTE — ED Notes (Signed)
Patient states he took a hydrocodone 10 mg/325mg  Acetaminophen at home around 1030.

## 2015-09-02 ENCOUNTER — Other Ambulatory Visit: Payer: Self-pay | Admitting: Cardiovascular Disease

## 2015-09-22 NOTE — Progress Notes (Addendum)
Cardiology Office Note Date:  09/23/2015  Patient ID:  Jonathon Ryan, Jonathon Ryan [birth year], MRN [id number] PCP:  Josue Hector, MD  Cardiologist:  Dr. [name] Electrophysiologist: [name]   Chief Complaint: concerned about ICD site may be infected  History of Present Illness: Jonathon Ryan is a 59 y.o. male with history of primary VF arrest, CAD, LBBB, s/p ICD implant who developed an ICD infection several months ago and underwent extraction and eventual insertion of a subcutaneous ICD.   He mentions he has not had much of an appetite and has lost weight, denies fever, chills or signs of illness, no dizziness, near syncope or syncope.  No shocks from his device.  He has not had CP, palpitations or overt DOE, he reports his exertional tolerance is at his baseline, no PND or orthopnea symptoms.  Past Medical History  Diagnosis Date  . Chronic systolic dysfunction of left ventricle   . Hyperlipidemia   . Cerebrovascular disease 10/15/2012  . Left bundle branch block   . Ischemic cardiomyopathy     s/p BiV ICD implant Feb 2014 - [address]. [name] [place], [place] [address]VF-64 (serial # [id number]) right atrial lead and a [name] [place], model 3329-51O (serial number [id number]) right ventricular defibrillator lead. [name] Quartet model [phone] - 86 (serial number [id number]) LV lead. [name] Quadra Assura model [place] 0630 [phone] (serial Number [id number]) biventricular ICD.   [name] Cardiac arrest due to underlying cardiac condition (HCC)     successfully rescucitated/ cooled  . CAD (coronary artery disease) 9/13    s/p CABG  . Headache(784.0)   . Renal artery stenosis (HCC)   . Peripheral arterial disease (HCC)   . Hypertension   . Cardiac defibrillator in place   . PONV (postoperative nausea and vomiting)   . Spinal headache   . Shortness of breath dyspnea     with exertion  . GERD (gastroesophageal reflux disease)   . Anxiety   . Infection of pacemaker pocket [facility])     of  ICD with subsequent explant    Past Surgical History  Procedure Laterality Date  . Coronary artery bypass graft  06/11/2012    Procedure: CORONARY ARTERY BYPASS GRAFTING (CABG);  Surgeon: [name], MD;  Location[facility] OR;  Service: Open Heart Surgery;  Laterality: [name];  Coronary Artery Bypass Grafting times four using left internal mammary artery and right greater saphenous vein endoscopically harvested.  [name] Percutaneous placement intravascular stent cervical carotid artery    . Left heart catheterization with coronary angiogram [name] 06/11/2012    Procedure: LEFT HEART CATHETERIZATION WITH CORONARY ANGIOGRAM;  Surgeon: Runell Gess, MD;  Location: [facility] CATH LAB;  Service: Cardiovascular;  Laterality: [name];  . Carotid stent insertion [name] 10/14/2012    Procedure: CAROTID STENT INSERTION;  Surgeon: Nada Libman, MD;  Location: [facility] CATH LAB;  Service: Cardiovascular;  Laterality: [name];  . Carotid angiogram Bilateral 10/14/2012    Procedure: CAROTID ANGIOGRAM;  Surgeon: Nada Libman, MD;  Location: [facility] CATH LAB;  Service: Cardiovascular;  Laterality: Bilateral;  . Bi-ventricular implantable cardioverter defibrillator [name] 11/25/2012    Procedure: BI-VENTRICULAR IMPLANTABLE CARDIOVERTER DEFIBRILLATOR  (CRT-D);  Surgeon: Hillis Range, MD;  Location: [facility] CATH LAB;  Service: Cardiovascular;  Laterality: [name];  . Icd lead removal Left 10/04/2014    Procedure: REMOVAL Saint Jude ICD with lead;  Surgeon: Marinus Maw, MD;  Location: [facility] OR;  Service: Cardiovascular;  Laterality: Left;  Jonathon Ryan is back  up for [facility]  . Ep implantable device [name] 03/24/2015    Procedure: SubQ ICD Implant;  Surgeon: Marinus Maw, MD;  Location: [facility] INVASIVE CV LAB;  Service: Cardiovascular;  Laterality: [name];  . Colonoscopy [name] 08/18/2015    Procedure: COLONOSCOPY;  Surgeon: Malissa Hippo, MD;  [facility];  Service: Endoscopy;  Laterality: [name];  830    Current Outpatient Prescriptions  Medication Sig Dispense Refill    . aspirin 81 MG tablet Take 81 mg by mouth daily.    [name] atorvastatin (LIPITOR) 80 MG tablet Take 80 mg by mouth daily.    . capsicum oleoresin (TRIXAICIN) 0.025 % cream Apply 1 application topically 3 (three) times daily.    . carvedilol (COREG) 25 MG tablet Take 0.5 tablets (12.5 mg total) by mouth 2 (two) times daily with a meal. 30 tablet 9  . clopidogrel (PLAVIX) 75 MG tablet Take 1 tablet (75 mg total) by mouth daily. with food 30 tablet 8  . cyclobenzaprine (FLEXERIL) 10 MG tablet Take 1 tablet (10 mg total) by mouth 2 (two) times daily as needed for muscle spasms. 20 tablet 0  . fenofibrate (TRICOR) 145 MG tablet Take 145 mg by mouth daily.    [name] HYDROcodone-acetaminophen (NORCO) 10-325 MG tablet Take 1 tablet by mouth every 6 (six) hours as needed for severe pain.    [name] HYDROcodone-acetaminophen (NORCO/VICODIN) 5-325 MG tablet Take 1-2 tablets by mouth every 6 (six) hours as needed. 20 tablet 0  . lisinopril (PRINIVIL,ZESTRIL) 20 MG tablet Take 10 mg by mouth daily.    [name] LORazepam (ATIVAN) 0.5 MG tablet Take 0.5 mg by mouth 3 (three) times daily.     [name] omega-3 acid ethyl esters (LOVAZA) 1 G capsule Take 1 g by mouth daily.    [name] omeprazole (PRILOSEC) 40 MG capsule Take 40 mg by mouth daily.     . predniSONE (DELTASONE) 10 MG tablet Take 4 tablets (40 mg total) by mouth daily. 20 tablet 0   No current facility-administered medications for this visit.    Allergies:   Review of patient's allergies indicates no known allergies.   Social History:  The patient  reports that he quit smoking about 3 years ago. His smoking use included Cigarettes. He has a 60 pack-year smoking history. He has never used smokeless tobacco. He reports that he drinks about 8.4 oz of alcohol per week. He reports that he does not use illicit drugs.   Family History:  The patient's family history includes Cancer in his brother.  ROS:  Please see the history of present illness.  All other systems are reviewed and  otherwise negative.   PHYSICAL EXAM:  VS:  BP 170/100 mmHg  Pulse 81  Ht  (1.778 m)  Wt 149 lb (67.586 kg)  BMI 21.38 kg/m2 BMI: Body mass index is 21.38 kg/(m^2). Well nourished, well developed, in no acute distress HEENT: normocephalic, atraumatic Neck: no JVD, + b/l carotid bruits  Cardiac:  normal S1, S2; RRR; no significant murmurs, no rubs, or gallops Lungs:  clear to auscultation bilaterally, no wheezing, rhonchi or rales Abd: soft, nontender,  + BS MS: no deformity or atrophy Ext: no edema Skin: warm and dry, no rash Neuro:  No gross deficits appreciated Psych: euthymic mood, full affect  ICD site is stable, no tethering or discomfort, no erythema or edema, no increased temp to the surrounding tissues.   EKG:  Done today shows SR, LBBB ICD interrogation today shows normal  function, see interrogation   02/23/15 Echocardiogram Study Conclusions - Left ventricle: Severe hypokinesis of inferior and inferolateral walls. Septal dyssynergy. Wall thickness was increased in a pattern of mild LVH. The estimated ejection fraction was 30%. - Mitral valve: There was mild regurgitation. - Right ventricle: The cavity size was normal. Systolic function was normal. - Pulmonary arteries: PA peak pressure: 28 mm Hg (S).  Recent Labs: 03/18/2015: BUN 16; Creatinine, Ser 1.04; Hemoglobin 13.7; Platelets 333.0; Potassium 4.3; Sodium 137  No results found for requested labs within last 365 days.   CrCl cannot be calculated (Patient has no serum creatinine result on file.).   Wt Readings from Last 3 Encounters:  09/23/15 149 lb (67.586 kg)  08/29/15 158 lb (71.668 kg)  08/18/15 158 lb (71.668 kg)     Other studies reviewed: Additional studies/records reviewed today include: summarized above  DEVICE history 03/24/15: [employer] subcutaneous ICD, serial [id number], Dr. Ladona Ridgelaylor 11/25/12:  BiVe ICD (STJ) Dr. [name], explanted secondary to infection  10/04/14  ASSESSMENT AND PLAN:  1. ICM, [place] SubQ ICD       On BB/ACE      appears compensated      Records indicate he did not have any improvement with the LV lead when he had it       Site appears stable, no evidence of infection       Continue 3 month remotes    2.  CAD       Last intervention, CABG Sept 2013       (Dr. Allyson SabalBerry)  3.   PVD       Left Carotid stent Jan 2014        RAS b/l       (Dr. Allyson SabalBerry)        4.   HTN       BP quite elevated upon arrival, a recheck is 140/80, he reports he was very worried about his site and the possility of another infection.         5. Unintentional weight loss     He has lost 9lbs in a month by his record, this is likely why he notes the ICD more prominent      He is instructed to see his PMD for further evaluation of this  Disposition: F/u with Dr. Ladona Ridgelaylor in 6months, he will see his MD about his weight loss, and Dr. Allyson SabalBerry as instructed by him.  Current medicines are reviewed at length with the patient today.  The patient did not have any concerns regarding medicines.  Judith BlonderSigned, Renee Ursy, PA-C 09/23/2015 11:09 AM     [facility] [address] [place] [address] [phone] (office)  [phone] (fax)

## 2015-09-23 ENCOUNTER — Ambulatory Visit (INDEPENDENT_AMBULATORY_CARE_PROVIDER_SITE_OTHER): Payer: Medicare Other | Admitting: Physician Assistant

## 2015-09-23 ENCOUNTER — Encounter: Payer: Self-pay | Admitting: Physician Assistant

## 2015-09-23 ENCOUNTER — Encounter: Payer: Self-pay | Admitting: Internal Medicine

## 2015-09-23 VITALS — BP 170/100 | HR 81 | Ht 70.0 in | Wt 149.0 lb

## 2015-09-23 DIAGNOSIS — I2510 Atherosclerotic heart disease of native coronary artery without angina pectoris: Secondary | ICD-10-CM | POA: Diagnosis not present

## 2015-09-23 DIAGNOSIS — I255 Ischemic cardiomyopathy: Secondary | ICD-10-CM | POA: Diagnosis not present

## 2015-09-23 DIAGNOSIS — I2583 Coronary atherosclerosis due to lipid rich plaque: Principal | ICD-10-CM

## 2015-09-23 DIAGNOSIS — I5022 Chronic systolic (congestive) heart failure: Secondary | ICD-10-CM

## 2015-09-23 DIAGNOSIS — I10 Essential (primary) hypertension: Secondary | ICD-10-CM | POA: Diagnosis not present

## 2015-09-23 NOTE — Patient Instructions (Signed)
Medication Instructions: CONTINUE SAME MEDICATIONS     If you need a refill on your cardiac medications before your next appointment, please call your pharmacy.  Labwork: NONE ORDER TODAY    Testing/Procedures: NONE ORDER TODAY    Follow-Up:  Your physician wants you to follow-up in:  IN 6 MONTHS WITH DR Court JoyAYLOR   You will receive a reminder letter in the mail two months in advance. If you don't receive a letter, please call our office to schedule the follow-up appointment.  Remote monitoring is used to monitor your Pacemaker of ICD from home. This monitoring reduces the number of office visits required to check your device to one time per year. It allows us to keep an eye on the functioning of your device to ensure it is working properly. You are scheduled for a device check from home on .12/22/15..You may send your transmission at any time that day. If you have a wireless device, the transmission will be sent automatically. After your physician reviews your transmission, you will receive a postcard with your next transmission date.       Any Other Special Instructions Will Be Listed Below (If Applicable).

## 2015-09-26 ENCOUNTER — Telehealth: Payer: Self-pay | Admitting: Cardiovascular Disease

## 2015-09-26 LAB — CUP PACEART INCLINIC DEVICE CHECK
Implantable Lead Location: 753860
MDC IDC LEAD IMPLANT DT: 20160616
MDC IDC LEAD MODEL: 3401
MDC IDC MSMT BATTERY REMAINING PERCENTAGE: 95 %
MDC IDC PG SERIAL: 116179
MDC IDC SESS DTM: 20161219134956

## 2015-09-26 NOTE — Telephone Encounter (Signed)
Dental clearance/med instruction. Routed to Dr. Allyson SabalBerry for his review.

## 2015-09-26 NOTE — Telephone Encounter (Signed)
1. What dental office are you calling from? (pt called in) Dr. Sonny DandyMalrie E. Johnson   2. What is your office phone and fax number? [phone]([phone](telephone)  3. What type of procedure is the patient having performed? Tooth extraction   4. What date is procedure scheduled? 1/24  5. What is your question (ex. Antibiotics prior to procedure, holding medication-we need to know how long dentist wants pt to hold med)? How long can the pt hold their Plavix 6.

## 2015-09-27 NOTE — Telephone Encounter (Signed)
Left pt message w notification this is OK.  Called dentist office. Confirmed this is for multiple extractions. Gave verbal recommendation for std length of time for Plavix hold.   Will fax clearance to [phone]

## 2015-09-27 NOTE — Telephone Encounter (Signed)
OK to hold anti platelet Rx for Dental procedure if necessary

## 2015-09-28 ENCOUNTER — Encounter: Payer: Self-pay | Admitting: *Deleted

## 2015-09-28 ENCOUNTER — Other Ambulatory Visit: Payer: Self-pay | Admitting: Cardiovascular Disease

## 2015-09-28 DIAGNOSIS — I739 Peripheral vascular disease, unspecified: Secondary | ICD-10-CM

## 2015-09-28 NOTE — Telephone Encounter (Signed)
Faxed clearance letter.

## 2015-10-05 ENCOUNTER — Ambulatory Visit (HOSPITAL_COMMUNITY)
Admission: RE | Admit: 2015-10-05 | Discharge: 2015-10-05 | Disposition: A | Discharge status: Home / Self Care | Payer: Medicare Other | Source: Ambulatory Visit | Attending: Cardiovascular Disease | Admitting: Cardiovascular Disease

## 2015-10-05 ENCOUNTER — Other Ambulatory Visit: Payer: Self-pay | Admitting: Cardiovascular Disease

## 2015-10-05 DIAGNOSIS — I739 Peripheral vascular disease, unspecified: Secondary | ICD-10-CM | POA: Insufficient documentation

## 2015-10-05 DIAGNOSIS — E785 Hyperlipidemia, unspecified: Secondary | ICD-10-CM | POA: Diagnosis not present

## 2015-10-05 DIAGNOSIS — I779 Disorder of arteries and arterioles, unspecified: Secondary | ICD-10-CM | POA: Diagnosis not present

## 2015-10-05 DIAGNOSIS — I771 Stricture of artery: Secondary | ICD-10-CM | POA: Diagnosis not present

## 2015-10-05 DIAGNOSIS — I10 Essential (primary) hypertension: Secondary | ICD-10-CM | POA: Insufficient documentation

## 2015-10-05 DIAGNOSIS — I700 Atherosclerosis of aorta: Secondary | ICD-10-CM | POA: Insufficient documentation

## 2015-10-05 DIAGNOSIS — R938 Abnormal findings on diagnostic imaging of other specified body structures: Secondary | ICD-10-CM | POA: Insufficient documentation

## 2015-11-28 ENCOUNTER — Other Ambulatory Visit: Payer: Self-pay | Admitting: Cardiovascular Disease

## 2015-11-28 NOTE — Telephone Encounter (Signed)
Rx request sent to pharmacy.  

## 2015-12-26 ENCOUNTER — Encounter: Payer: Self-pay | Admitting: *Deleted

## 2015-12-29 ENCOUNTER — Other Ambulatory Visit: Payer: Self-pay | Admitting: Cardiovascular Disease

## 2015-12-29 NOTE — Telephone Encounter (Signed)
Rx(s) sent to pharmacy electronically.  

## 2015-12-30 ENCOUNTER — Encounter: Payer: Self-pay | Admitting: Cardiology

## 2016-01-09 ENCOUNTER — Telehealth: Payer: Self-pay | Admitting: Internal Medicine

## 2016-01-09 ENCOUNTER — Ambulatory Visit (INDEPENDENT_AMBULATORY_CARE_PROVIDER_SITE_OTHER): Payer: Medicare HMO | Admitting: *Deleted

## 2016-01-09 DIAGNOSIS — I255 Ischemic cardiomyopathy: Secondary | ICD-10-CM

## 2016-01-09 NOTE — Telephone Encounter (Signed)
Informed pt that we did receive his remote transmission. Pt verbalized understanding.  

## 2016-01-09 NOTE — Progress Notes (Signed)
Remote ICD transmission.   

## 2016-01-09 NOTE — Telephone Encounter (Signed)
New Message  Pt requested to speak w/ Device to see if remote was sent successfully. Please call back and discuss.

## 2016-01-28 ENCOUNTER — Other Ambulatory Visit: Payer: Self-pay | Admitting: Cardiovascular Disease

## 2016-01-30 NOTE — Telephone Encounter (Signed)
REFILL 

## 2016-01-31 ENCOUNTER — Other Ambulatory Visit: Payer: Self-pay | Admitting: Cardiovascular Disease

## 2016-01-31 NOTE — Telephone Encounter (Signed)
Rx(s) sent to pharmacy electronically.  

## 2016-02-15 ENCOUNTER — Other Ambulatory Visit: Payer: Self-pay | Admitting: Cardiovascular Disease

## 2016-02-16 ENCOUNTER — Other Ambulatory Visit: Payer: Self-pay | Admitting: Cardiovascular Disease

## 2016-02-16 NOTE — Telephone Encounter (Signed)
Rx(s) sent to pharmacy electronically.  

## 2016-02-27 LAB — CUP PACEART REMOTE DEVICE CHECK
Implantable Lead Implant Date: 20160616
Implantable Lead Location: 753860
MDC IDC LEAD MODEL: 3401
MDC IDC MSMT BATTERY REMAINING PERCENTAGE: 91 %
MDC IDC SESS DTM: 20170403122100
Pulse Gen Serial Number: 116179

## 2016-02-28 ENCOUNTER — Encounter: Payer: Self-pay | Admitting: Cardiology

## 2016-03-01 ENCOUNTER — Other Ambulatory Visit: Payer: Self-pay | Admitting: Cardiovascular Disease

## 2016-03-02 NOTE — Telephone Encounter (Signed)
REFILL 

## 2016-03-13 ENCOUNTER — Ambulatory Visit (INDEPENDENT_AMBULATORY_CARE_PROVIDER_SITE_OTHER): Payer: Medicare HMO | Admitting: Cardiovascular Disease

## 2016-03-13 ENCOUNTER — Encounter: Payer: Self-pay | Admitting: Cardiovascular Disease

## 2016-03-13 VITALS — BP 136/67 | HR 60 | Ht 70.0 in | Wt 166.0 lb

## 2016-03-13 DIAGNOSIS — I10 Essential (primary) hypertension: Secondary | ICD-10-CM | POA: Diagnosis not present

## 2016-03-13 DIAGNOSIS — I2510 Atherosclerotic heart disease of native coronary artery without angina pectoris: Secondary | ICD-10-CM | POA: Diagnosis not present

## 2016-03-13 DIAGNOSIS — Z95810 Presence of automatic (implantable) cardiac defibrillator: Secondary | ICD-10-CM

## 2016-03-13 DIAGNOSIS — I6523 Occlusion and stenosis of bilateral carotid arteries: Secondary | ICD-10-CM

## 2016-03-13 DIAGNOSIS — I779 Disorder of arteries and arterioles, unspecified: Secondary | ICD-10-CM | POA: Diagnosis not present

## 2016-03-13 DIAGNOSIS — I739 Peripheral vascular disease, unspecified: Secondary | ICD-10-CM

## 2016-03-13 DIAGNOSIS — E785 Hyperlipidemia, unspecified: Secondary | ICD-10-CM

## 2016-03-13 DIAGNOSIS — I701 Atherosclerosis of renal artery: Secondary | ICD-10-CM | POA: Insufficient documentation

## 2016-03-13 DIAGNOSIS — I2583 Coronary atherosclerosis due to lipid rich plaque: Secondary | ICD-10-CM

## 2016-03-13 NOTE — Assessment & Plan Note (Signed)
History of hypertension blood pressures measured 136/67 He is on carvedilol and lisinopril. Continue current meds at current dosing

## 2016-03-13 NOTE — Patient Instructions (Signed)
Medication Instructions:  Your physician recommends that you continue on your current medications as directed. Please refer to the Current Medication list given to you today.   Labwork: NONE  Testing/Procedures: Your physician has requested that you have a carotid duplex. This test is an ultrasound of the carotid arteries in your neck. It looks at blood flow through these arteries that supply the brain with blood. Allow one hour for this exam. There are no restrictions or special instructions.  Your physician has requested that you have a renal artery duplex. During this test, an ultrasound is used to evaluate blood flow to the kidneys. Allow one hour for this exam. Do not eat after midnight the day before and avoid carbonated beverages. Take your medications as you usually do.    Follow-Up: Your physician wants you to follow-up in: 12 MONTHS WITH DR Allyson SabalBERRY. You will receive a reminder letter in the mail two months in advance. If you don't receive a letter, please call our office to schedule the follow-up appointment.   Any Other Special Instructions Will Be Listed Below (If Applicable).     If you need a refill on your cardiac medications before your next appointment, please call your pharmacy.

## 2016-03-13 NOTE — Assessment & Plan Note (Signed)
Status post Bi V ICD implantation by Dr. [name] 11/25/12 with marked improvement in his clinical symptoms and exercise tolerance however no significant change in his ejection fraction.he ultimately had a pocket infection and underwent explant of his device with implantation of a subcutaneous ICD.

## 2016-03-13 NOTE — Assessment & Plan Note (Signed)
History of carotid artery disease status post carotid stenting which I performed on him 10/14/12 in the "Sapphire 'protocol for high-risk asymptomatic patients We've been following this by duplex ultrasound.

## 2016-03-13 NOTE — Assessment & Plan Note (Signed)
status post witnessed cardiac arrest with bystander CPR and defibrillation, or ticks on underwent emergent cardiac catheterization by myself revealing left main/three-vessel disease and ultimately underwent emergency coronary artery bypass grafting by Dr. Kathlee NationsPeter Van Trigt with  the LIMA to his LAD, vein graft to the PDA and sequential vein graft to the OM1 and 2. His EF was in the 25% range. He made a full neurologic recovery. Myoview stress test that showed inferior scar without ischemia and EF 25-30% range confirmed by echo. He denies chest pain or shortness of breath.

## 2016-03-13 NOTE — Assessment & Plan Note (Signed)
History of hyperlipidemia on statin therapy followed by his PCP 

## 2016-03-13 NOTE — Progress Notes (Signed)
03/13/2016 Jonathon Ryan   1956-02-07  [id number]  Primary Physician Jonathon Hector, MD Primary Cardiologist: Jonathon Gess MD Jonathon Ryan   HPI:  The patient is a 60 year old, thin-appearing, married Caucasian male with no children who I last saw in the office 12/31/14. He had witnessed cardiac arrest with bystander CPR and defibrillation, arctic sun who presented for emergent cardiac catheterization by myself revealing left main 3-vessel disease and he ultimately underwent emergency coronary artery bypass grafting by Dr. Kathlee Nations Ryan with LIMA to his LAD, vein to a PDA, sequential vein to OM1 and 2. His EF was 25% to 30%. He made a full neurologic recovery. He is wearing a LifeVest. His other problems include hypertension and hyperlipidemia and discontinued tobacco abuse which he stopped June 21, 2012. He also has peripheral vascular occlusive disease with high-grade left internal carotid artery stenosis followed by duplex ultrasound, high-grade right renal artery stenosis and right iliac stenosis though he denies claudication. We have gotten Dopplers on both his renals and his lower extremities. He had a Myoview stress test that showed an inferior scar without ischemia with an EF in the 25%-30% Ryan confirmed by 2D echo. I performed carotid stenting on him under the "sapphire protocol" for high risk asymptomatic patients on October 14, 2012, with an excellent result. Followup Dopplers of his carotidis performed on October 28, 2012, were normal. Dr. Hillis Ryan performed implantation of a BiV ICD on November 25, 2012, which has resulted in marked improvement in him clinically with regards to exercise tolerance. We continue to follow his carotid and renal Dopplers which have remained stable. His last lotion and Dopplers were performed a year ago in January and these were stable as well. He denies claudication. He did unfortunately suffer a pocket infection of his ICD  requiring explant. He also had a subcutaneous ICD placed as followed by Dr. Johney Ryan. He denies chest pain or shortness of breath.  Current Outpatient Prescriptions  Medication Sig Dispense Refill  . aspirin 81 MG tablet Take 81 mg by mouth daily.    . carvedilol (COREG) 25 MG tablet TAKE ONE-HALF TABLET BY MOUTH TWICE DAILY WITH  A  MEAL  NEEDS  OFFICE  VISIT 30 tablet 3  . clopidogrel (PLAVIX) 75 MG tablet Take 1 tablet (75 mg total) by mouth daily. PLEASE KEEP UPCOMING APPOINTMENT 30 tablet 0  . fenofibrate (TRICOR) 145 MG tablet Take 1 tablet (145 mg total) by mouth daily. NEED OV. 30 tablet 0  . lisinopril (PRINIVIL,ZESTRIL) 20 MG tablet Take 10 mg by mouth daily.    [name] LORazepam (ATIVAN) 0.5 MG tablet Take 0.5 mg by mouth 3 (three) times daily.     [name] omega-3 acid ethyl esters (LOVAZA) 1 G capsule Take 1 g by mouth daily.    [name] omeprazole (PRILOSEC) 40 MG capsule Take 40 mg by mouth daily.      No current facility-administered medications for this visit.    No Known Allergies  Social History   Social History  . Marital Status: Married    Spouse Name: [name]  . Number of Children: [name]  . Years of Education: [name]   Occupational History  . Not on file.   Social History Main Topics  . Smoking status: Former Smoker -- 1.50 packs/day for 40 years    Types: Cigarettes    Quit date: 06/11/2012  . Smokeless tobacco: Never Used  . Alcohol Use: 8.4 oz/week    14 Cans of beer per  week  . Drug Use: No  . Sexual Activity: Yes   Other Topics Concern  . Not on file   Social History Narrative     Review of Systems: General: negative for chills, fever, night sweats or weight changes.  Cardiovascular: negative for chest pain, dyspnea on exertion, edema, orthopnea, palpitations, paroxysmal nocturnal dyspnea or shortness of breath Dermatological: negative for rash Respiratory: negative for cough or wheezing Urologic: negative for hematuria Abdominal: negative for nausea, vomiting,  diarrhea, bright red blood per rectum, melena, or hematemesis Neurologic: negative for visual changes, syncope, or dizziness All other systems reviewed and are otherwise negative except as noted above.    Blood pressure 136/67, pulse 60, height 5\' 10"  (1.778 m), weight 166 lb (75.297 kg).  General appearance: alert and no distress Neck: no adenopathy, no JVD, supple, symmetrical, trachea midline, thyroid not enlarged, symmetric, no tenderness/mass/nodules and ilateral carotid bruits right louder than left Lungs: clear to auscultation bilaterally Heart: regular rate and rhythm, S1, S2 normal, no murmur, click, rub or gallop Extremities: extremities normal, atraumatic, no cyanosis or edema  EKG normal sinus rhythm with a nonspecific IVCD. I personally reviewed this EKG  ASSESSMENT AND PLAN:   CAD, 90% LM, Total RCA, 80-90% CFX- S/P urgent CABG X 11 Jun 2012 status post witnessed cardiac arrest with bystander CPR and defibrillation, or ticks on underwent emergent cardiac catheterization by myself revealing left main/three-vessel disease and ultimately underwent emergency coronary artery bypass grafting by Dr. Kathlee NationsPeter Van Ryan with  the LIMA to his LAD, vein graft to the PDA and sequential vein graft to the OM1 and 2. His EF was in the 25% Ryan. He made a full neurologic recovery. Myoview stress test that showed inferior scar without ischemia and EF 25-30% Ryan confirmed by echo. He denies chest pain or shortness of breath.  Carotid artery stenosis, LICA stent 10/14/12 History of carotid artery disease status post carotid stenting which I performed on him 10/14/12 in the "Sapphire 'protocol for high-risk asymptomatic patients We've been following this by duplex ultrasound.  Hypertension History of hypertension blood pressures measured 136/67 He is on carvedilol and lisinopril. Continue current meds at current dosing  S/P ICD (internal cardiac defibrillator) procedure Status post Bi V ICD  implantation by Dr. [name] 11/25/12 with marked improvement in his clinical symptoms and exercise tolerance however no significant change in his ejection fraction.he ultimately had a pocket infection and underwent explant of his device with implantation of a subcutaneous ICD.  HLD (hyperlipidemia) History of hyperlipidemia on statin therapy followed by his PCP      Jonathon GessJonathan J. Akia Desroches MD [facility] LPFACP,[name],[name], [facility] 03/13/2016 4:35 PM

## 2016-03-13 NOTE — Assessment & Plan Note (Signed)
History of renal artery stenosis with Dopplers performed 12/13/14 revealing a right renal aortic ratio of 6.76 with a properly sized kidneys. We will repeat renal Doppler studies.

## 2016-03-14 ENCOUNTER — Other Ambulatory Visit: Payer: Self-pay | Admitting: *Deleted

## 2016-03-14 DIAGNOSIS — I779 Disorder of arteries and arterioles, unspecified: Secondary | ICD-10-CM

## 2016-03-14 DIAGNOSIS — I771 Stricture of artery: Secondary | ICD-10-CM

## 2016-03-16 ENCOUNTER — Other Ambulatory Visit: Payer: Self-pay | Admitting: Cardiovascular Disease

## 2016-03-16 NOTE — Telephone Encounter (Signed)
Rx request sent to pharmacy.  

## 2016-03-29 ENCOUNTER — Ambulatory Visit (HOSPITAL_COMMUNITY)
Admission: RE | Admit: 2016-03-29 | Discharge: 2016-03-29 | Disposition: A | Discharge status: Home / Self Care | Payer: Medicare HMO | Source: Ambulatory Visit | Attending: Cardiovascular Disease | Admitting: Cardiovascular Disease

## 2016-03-29 ENCOUNTER — Telehealth: Payer: Self-pay | Admitting: *Deleted

## 2016-03-29 DIAGNOSIS — E785 Hyperlipidemia, unspecified: Secondary | ICD-10-CM | POA: Insufficient documentation

## 2016-03-29 DIAGNOSIS — I6523 Occlusion and stenosis of bilateral carotid arteries: Secondary | ICD-10-CM | POA: Insufficient documentation

## 2016-03-29 DIAGNOSIS — I10 Essential (primary) hypertension: Secondary | ICD-10-CM | POA: Insufficient documentation

## 2016-03-29 DIAGNOSIS — I739 Peripheral vascular disease, unspecified: Secondary | ICD-10-CM | POA: Diagnosis not present

## 2016-03-29 DIAGNOSIS — F419 Anxiety disorder, unspecified: Secondary | ICD-10-CM | POA: Insufficient documentation

## 2016-03-29 DIAGNOSIS — I779 Disorder of arteries and arterioles, unspecified: Secondary | ICD-10-CM

## 2016-03-29 DIAGNOSIS — I6529 Occlusion and stenosis of unspecified carotid artery: Secondary | ICD-10-CM

## 2016-03-29 DIAGNOSIS — I2510 Atherosclerotic heart disease of native coronary artery without angina pectoris: Secondary | ICD-10-CM | POA: Insufficient documentation

## 2016-03-29 DIAGNOSIS — I701 Atherosclerosis of renal artery: Secondary | ICD-10-CM | POA: Insufficient documentation

## 2016-03-29 DIAGNOSIS — K219 Gastro-esophageal reflux disease without esophagitis: Secondary | ICD-10-CM | POA: Diagnosis not present

## 2016-03-29 NOTE — Telephone Encounter (Signed)
Order entered into EPIC.    

## 2016-03-29 NOTE — Telephone Encounter (Signed)
-----   Message from [name], MD sent at 03/29/2016 10:49 AM EDT ----- Mild progression left ICA stenosis, repeat 12 months

## 2016-04-02 ENCOUNTER — Other Ambulatory Visit: Payer: Self-pay | Admitting: Cardiovascular Disease

## 2016-04-02 NOTE — Telephone Encounter (Signed)
Rx request sent to pharmacy.  

## 2016-05-21 ENCOUNTER — Ambulatory Visit (INDEPENDENT_AMBULATORY_CARE_PROVIDER_SITE_OTHER): Payer: Medicare HMO | Admitting: Internal Medicine

## 2016-05-21 ENCOUNTER — Encounter: Payer: Self-pay | Admitting: Internal Medicine

## 2016-05-21 VITALS — BP 130/70 | HR 71 | Ht 70.0 in | Wt 171.0 lb

## 2016-05-21 DIAGNOSIS — I255 Ischemic cardiomyopathy: Secondary | ICD-10-CM | POA: Diagnosis not present

## 2016-05-21 DIAGNOSIS — I4901 Ventricular fibrillation: Secondary | ICD-10-CM | POA: Diagnosis not present

## 2016-05-21 LAB — CUP PACEART INCLINIC DEVICE CHECK
Implantable Lead Location: 753860
Implantable Lead Model: 3401
MDC IDC LEAD IMPLANT DT: 20160616
MDC IDC SESS DTM: 20170814104350
Pulse Gen Serial Number: 116179

## 2016-05-21 NOTE — Progress Notes (Signed)
HPI [name] returns today for followup. He is a pleasant 60 yo man with a primary VF arrest, CAD, LBBB, s/p ICD implant who developed an ICD infection, underwent extraction. In the interim he has been stable and undergone an insertion of a subcutaneous ICD. He denies chest pain or sob. No ICD shocks. No trouble healing. No Known Allergies   Current Outpatient Prescriptions  Medication Sig Dispense Refill  . aspirin 81 MG tablet Take 81 mg by mouth daily.    . carvedilol (COREG) 25 MG tablet TAKE ONE-HALF TABLET BY MOUTH TWICE DAILY WITH  A  MEAL  NEEDS  OFFICE  VISIT 30 tablet 3  . clopidogrel (PLAVIX) 75 MG tablet Take 1 tablet (75 mg total) by mouth daily. 30 tablet 11  . fenofibrate (TRICOR) 145 MG tablet Take 1 tablet (145 mg total) by mouth daily. 30 tablet 11  . lisinopril (PRINIVIL,ZESTRIL) 20 MG tablet Take 10 mg by mouth daily.    [name]. LORazepam (ATIVAN) 0.5 MG tablet Take 0.5 mg by mouth 3 (three) times daily.     [name]. omega-3 acid ethyl esters (LOVAZA) 1 G capsule Take 1 g by mouth daily.    [name]. omeprazole (PRILOSEC) 40 MG capsule Take 40 mg by mouth daily.     . rosuvastatin (CRESTOR) 40 MG tablet Take 40 mg by mouth daily.     No current facility-administered medications for this visit.      Past Medical History:  Diagnosis Date  . Anxiety   . CAD (coronary artery disease) 9/13   s/p CABG  . Cardiac arrest due to underlying cardiac condition (HCC)    successfully rescucitated/ cooled  . Cardiac defibrillator in place   . Cerebrovascular disease 10/15/2012  . Chronic systolic dysfunction of left ventricle   . GERD (gastroesophageal reflux disease)   . Headache(784.0)   . Hyperlipidemia   . Hypertension   . Infection of pacemaker pocket (HCC)    of ICD with subsequent explant  . Ischemic cardiomyopathy    s/p BiV ICD implant Feb 2014 - [address]. [name] [place]ril, [place] [id number] (serial # [id number]) right atrial lead and a [name] [place], model 5409-81X7122-65Q  (serial number [id number]) right ventricular defibrillator lead. [name] Quartet model [id number] - 86 (serial number [id number]) LV lead. [name] Quadra Assura model [place] [id number] (serial Number [id number]) biventricular ICD.   [name]. Left bundle branch block   . Peripheral arterial disease (HCC)   . PONV (postoperative nausea and vomiting)   . Renal artery stenosis (HCC)   . Shortness of breath dyspnea    with exertion  . Spinal headache     ROS:   All systems reviewed and negative except as noted in the HPI.   Past Surgical History:  Procedure Laterality Date  . BI-VENTRICULAR IMPLANTABLE CARDIOVERTER DEFIBRILLATOR [name] 11/25/2012   Procedure: BI-VENTRICULAR IMPLANTABLE CARDIOVERTER DEFIBRILLATOR  (CRT-D);  Surgeon: Jonathon RangeJames Allred, MD;  [facility];  Service: Cardiovascular;  Laterality: [name];  . CAROTID ANGIOGRAM Bilateral 10/14/2012   Procedure: CAROTID ANGIOGRAM;  Surgeon: Jonathon LibmanVance W Brabham, MD;  [facility];  Service: Cardiovascular;  Laterality: Bilateral;  . CAROTID STENT INSERTION [name] 10/14/2012   Procedure: CAROTID STENT INSERTION;  Surgeon: Jonathon LibmanVance W Brabham, MD;  [facility];  Service: Cardiovascular;  Laterality: [name];  . COLONOSCOPY [name] 08/18/2015   Procedure: COLONOSCOPY;  Surgeon: Malissa HippoNajeeb U Rehman, MD;  Location[facility];  Service: Endoscopy;  Laterality: [name];  830  . CORONARY ARTERY BYPASS GRAFT  06/11/2012   Procedure: CORONARY ARTERY BYPASS GRAFTING (CABG);  Surgeon: Jonathon PernaPeter Van Trigt, MD;  [facility];  Service: Open Heart Surgery;  Laterality: [name];  Coronary Artery Bypass Grafting times four using left internal mammary artery and right greater saphenous vein endoscopically harvested.  . EP IMPLANTABLE DEVICE [name] 03/24/2015   Procedure: SubQ ICD Implant;  Surgeon: Jonathon MawGregg W Taylor, MD;  [facility];  Service: Cardiovascular;  Laterality: [name];  . ICD LEAD REMOVAL Left 10/04/2014   Procedure: REMOVAL Saint Jude ICD with lead;   Surgeon: Jonathon MawGregg W Taylor, MD;  [facility];  Service: Cardiovascular;  Laterality: Left;  Jonathon Ryan is back up for Jonathon Bath [employer] Beyondaylor  . LEFT HEART CATHETERIZATION WITH CORONARY ANGIOGRAM [name] 06/11/2012   Procedure: LEFT HEART CATHETERIZATION WITH CORONARY ANGIOGRAM;  Surgeon: Jonathon GessJonathan J Berry, MD;  [facility];  Service: Cardiovascular;  Laterality: [name];  . PERCUTANEOUS PLACEMENT INTRAVASCULAR STENT CERVICAL CAROTID ARTERY       Family History  Problem Relation Age of Onset  . Cancer Brother      Social History   Social History  . Marital status: Married    Spouse name: [name]  . Number of children: [name]  . Years of education: [name]   Occupational History  . Not on file.   Social History Main Topics  . Smoking status: Former Smoker    Packs/day: 1.50    Years: 40.00    Types: Cigarettes    Quit date: 06/11/2012  . Smokeless tobacco: Never Used  . Alcohol use 8.4 oz/week    14 Cans of beer per week  . Drug use: No  . Sexual activity: Yes   Other Topics Concern  . Not on file   Social History Narrative  . No narrative on file     BP 130/70   Pulse 71   Ht 5\' 10"  (1.778 m)   Wt 171 lb (77.6 kg)   SpO2 95%   BMI 24.54 kg/m   Physical Exam:  Well appearing middle aged man, NAD HEENT: Unremarkable Neck:  No JVD, no thyromegally Back:  No CVA tenderness Lungs:  Clear with no wheezes, his chest and left-sided incisions are well-healed. HEART:  Regular rate rhythm, no murmurs, no rubs, no clicks Abd:  soft, positive bowel sounds, no organomegally, no rebound, no guarding Ext:  2 plus pulses, no edema, no cyanosis, no clubbing Skin:  No rashes no nodules Neuro:  CN II through XII intact, motor grossly intact  ICD interogation - normal [place] Sci SQ ICD function. VVI post shock pacing turned on.  Assess/Plan: 1. VF arrest - he has had no recurrent arrhythmias 2. ICD - his [place] Sci Subcutaneous device is functioning normally.  3. CAD - he denies any chest pain or sob.  He is encouraged to continue his physical activity.  Leonia ReevesGregg Ryan,M.D.

## 2016-05-21 NOTE — Patient Instructions (Signed)
Your physician wants you to follow-up in: 12 Months with Dr. Ladona Ridgelaylor. You will receive a reminder letter in the mail two months in advance. If you don't receive a letter, please call our office to schedule the follow-up appointment.  Remote monitoring is used to monitor your Pacemaker of ICD from home. This monitoring reduces the number of office visits required to check your device to one time per year. It allows us to keep an eye on the functioning of your device to ensure it is working properly. You are scheduled for a device check from home on 08/20/16. You may send your transmission at any time that day. If you have a wireless device, the transmission will be sent automatically. After your physician reviews your transmission, you will receive a postcard with your next transmission date.  Your physician recommends that you continue on your current medications as directed. Please refer to the Current Medication list given to you today.  If you need a refill on your cardiac medications before your next appointment, please call your pharmacy.  Thank you for choosing [facility]!

## 2016-06-08 ENCOUNTER — Encounter: Payer: Self-pay | Admitting: Internal Medicine

## 2016-06-21 ENCOUNTER — Other Ambulatory Visit: Payer: Self-pay | Admitting: Cardiovascular Disease

## 2016-06-21 DIAGNOSIS — I255 Ischemic cardiomyopathy: Secondary | ICD-10-CM

## 2016-06-29 ENCOUNTER — Other Ambulatory Visit: Payer: Self-pay | Admitting: Cardiovascular Disease

## 2016-07-02 NOTE — Telephone Encounter (Signed)
Rx request sent to pharmacy.  

## 2016-07-03 ENCOUNTER — Other Ambulatory Visit: Payer: Self-pay | Admitting: Cardiovascular Disease

## 2016-08-06 ENCOUNTER — Other Ambulatory Visit: Payer: Self-pay

## 2016-08-06 DIAGNOSIS — I255 Ischemic cardiomyopathy: Secondary | ICD-10-CM

## 2016-08-06 MED ORDER — LISINOPRIL 10 MG PO TABS: 10.0000 mg | ORAL_TABLET | Freq: Every day | ORAL | 11 refills | Status: DC

## 2016-08-06 NOTE — Telephone Encounter (Signed)
Patient reports to pharmacy that he only takes Lisinopril 10 mg. Chart review confirms that he takes 10 mg of Lisinopril. Rx for Lisinopril 10 mg sent to pharmacy electronically.

## 2016-08-20 ENCOUNTER — Ambulatory Visit (INDEPENDENT_AMBULATORY_CARE_PROVIDER_SITE_OTHER): Payer: Medicare HMO | Admitting: *Deleted

## 2016-08-20 DIAGNOSIS — I255 Ischemic cardiomyopathy: Secondary | ICD-10-CM

## 2016-08-21 NOTE — Progress Notes (Signed)
Remote ICD transmission.   

## 2016-08-31 LAB — CUP PACEART REMOTE DEVICE CHECK
Date Time Interrogation Session: 20171113133100
Implantable Lead Implant Date: 20160616
Implantable Pulse Generator Implant Date: 20160616
MDC IDC LEAD LOCATION: 753860
MDC IDC LEAD MODEL: 3401
MDC IDC MSMT BATTERY REMAINING PERCENTAGE: 84 %
Pulse Gen Serial Number: 116179

## 2016-09-12 ENCOUNTER — Telehealth: Payer: Self-pay | Admitting: Cardiovascular Disease

## 2016-09-12 NOTE — Telephone Encounter (Signed)
New message  Pt is calling to schedule a triple A Duplex  Please call back

## 2016-11-20 ENCOUNTER — Ambulatory Visit (INDEPENDENT_AMBULATORY_CARE_PROVIDER_SITE_OTHER): Payer: Medicare HMO | Admitting: *Deleted

## 2016-11-20 DIAGNOSIS — I255 Ischemic cardiomyopathy: Secondary | ICD-10-CM

## 2016-11-20 NOTE — Progress Notes (Signed)
Remote ICD transmission.   

## 2016-11-21 ENCOUNTER — Encounter: Payer: Self-pay | Admitting: Cardiology

## 2016-11-22 LAB — CUP PACEART REMOTE DEVICE CHECK
Date Time Interrogation Session: 20180213130800
Implantable Lead Implant Date: 20160616
Implantable Lead Location: 753860
Implantable Pulse Generator Implant Date: 20160616
MDC IDC MSMT BATTERY REMAINING PERCENTAGE: 81 %
Pulse Gen Serial Number: 116179

## 2017-01-07 ENCOUNTER — Other Ambulatory Visit: Payer: Self-pay

## 2017-01-07 DIAGNOSIS — I255 Ischemic cardiomyopathy: Secondary | ICD-10-CM

## 2017-01-07 MED ORDER — LISINOPRIL 10 MG PO TABS: 10.0000 mg | ORAL_TABLET | Freq: Every day | ORAL | 3 refills | Status: DC

## 2017-02-01 ENCOUNTER — Other Ambulatory Visit: Payer: Self-pay | Admitting: Cardiovascular Disease

## 2017-02-01 NOTE — Telephone Encounter (Signed)
REFILL 

## 2017-02-19 ENCOUNTER — Ambulatory Visit (INDEPENDENT_AMBULATORY_CARE_PROVIDER_SITE_OTHER): Payer: Medicare HMO | Admitting: *Deleted

## 2017-02-19 DIAGNOSIS — I255 Ischemic cardiomyopathy: Secondary | ICD-10-CM

## 2017-02-19 NOTE — Progress Notes (Signed)
Remote ICD transmission.   

## 2017-02-20 ENCOUNTER — Encounter: Payer: Self-pay | Admitting: Cardiology

## 2017-02-20 LAB — CUP PACEART REMOTE DEVICE CHECK
Battery Remaining Percentage: 79 %
Implantable Lead Implant Date: 20160616
Implantable Lead Location: 753860
Implantable Pulse Generator Implant Date: 20160616
MDC IDC SESS DTM: 20180515114500
Pulse Gen Serial Number: 116179

## 2017-03-14 ENCOUNTER — Other Ambulatory Visit: Payer: Self-pay | Admitting: Cardiovascular Disease

## 2017-03-15 ENCOUNTER — Encounter: Payer: Self-pay | Admitting: Cardiovascular Disease

## 2017-03-15 ENCOUNTER — Ambulatory Visit (INDEPENDENT_AMBULATORY_CARE_PROVIDER_SITE_OTHER): Payer: Medicare HMO | Admitting: Cardiovascular Disease

## 2017-03-15 VITALS — BP 156/87 | HR 55 | Ht 70.0 in | Wt 177.8 lb

## 2017-03-15 DIAGNOSIS — Z720 Tobacco use: Secondary | ICD-10-CM | POA: Diagnosis not present

## 2017-03-15 DIAGNOSIS — I447 Left bundle-branch block, unspecified: Secondary | ICD-10-CM

## 2017-03-15 DIAGNOSIS — E7800 Pure hypercholesterolemia, unspecified: Secondary | ICD-10-CM | POA: Diagnosis not present

## 2017-03-15 DIAGNOSIS — I10 Essential (primary) hypertension: Secondary | ICD-10-CM

## 2017-03-15 DIAGNOSIS — I739 Peripheral vascular disease, unspecified: Secondary | ICD-10-CM

## 2017-03-15 DIAGNOSIS — I255 Ischemic cardiomyopathy: Secondary | ICD-10-CM | POA: Diagnosis not present

## 2017-03-15 DIAGNOSIS — I6523 Occlusion and stenosis of bilateral carotid arteries: Secondary | ICD-10-CM

## 2017-03-15 NOTE — Assessment & Plan Note (Signed)
History of peripheral arterial disease with known 90% right renal artery stenosis as well as 80% right iliac stenosis demonstrated at the time of emergent in 2013. He denies claudication. We continue to follow this by duplex ultrasound.

## 2017-03-15 NOTE — Assessment & Plan Note (Signed)
History of hyperlipidemia on fenofibrate and Crestor followed by his PCP

## 2017-03-15 NOTE — Assessment & Plan Note (Signed)
History of ischemic cardiomyopathy EF 25-30% range on appropriate medications without evidence of heart failure. He does have a subcutaneous ICD in place which has not delivered therapy. He is followed by Dr. Ladona Ridgelaylor.

## 2017-03-15 NOTE — Assessment & Plan Note (Signed)
History of hypertension blood pressure measured 156/87. He is on carvedilol and lisinopril. Continue current meds at current dosing

## 2017-03-15 NOTE — Patient Instructions (Signed)
Medication Instructions: Your physician recommends that you continue on your current medications as directed. Please refer to the Current Medication list given to you today.  Testing/Procedures: Your physician has requested that you have a renal artery duplex. During this test, an ultrasound is used to evaluate blood flow to the kidneys. Allow one hour for this exam. Do not eat after midnight the day before and avoid carbonated beverages. Take your medications as you usually do.  Your physician has requested that you have a lower extremity arterial duplex. During this test, ultrasound is used to evaluate arterial blood flow in the legs. Allow one hour for this exam. There are no restrictions or special instructions. Your physician has requested that you have an ankle brachial index (ABI). During this test an ultrasound and blood pressure cuff are used to evaluate the arteries that supply the arms and legs with blood. Allow thirty minutes for this exam. There are no restrictions or special instructions.  Your physician has requested that you have a carotid duplex. This test is an ultrasound of the carotid arteries in your neck. It looks at blood flow through these arteries that supply the brain with blood. Allow one hour for this exam. There are no restrictions or special instructions.  Your physician has requested that you have a aorta and iliac duplex. During this test, an ultrasound is used to evaluate blood flow to the aorta and iliac arteries. Allow one hour for this exam. Do not eat after midnight the day before and avoid carbonated beverages.   (All are due this month)   Follow-Up: Your physician wants you to follow-up in: 1 year with Dr. Allyson SabalBerry. You will receive a reminder letter in the mail two months in advance. If you don't receive a letter, please call our office to schedule the follow-up appointment.  If you need a refill on your cardiac medications before your next appointment, please  call your pharmacy.

## 2017-03-15 NOTE — Assessment & Plan Note (Signed)
History of CAD status post sudden cardiac death, witnessed arrest, bystander CPR or defibrillation and emergent cardiac catheterization by myself September 2013 revealing left main/three-vessel disease ultimately requiring coronary artery bypass grafting by Dr. Donata ClayVan Trigt with a LIMA to the LAD, vein to the PDA and sequential vein to OM1 and 2. His EF has been in the 25-30% range. He denies chest pain or shortness of breath.

## 2017-03-15 NOTE — Assessment & Plan Note (Signed)
History of carotid artery disease status post carotid artery stenting by myself 10/14/12 in the "sapphire" protocol for high risk asymptomatic patients which we have been following by duplex ultrasound. He does have bilateral bruits on exam. We will recheck carotid Doppler studies.

## 2017-03-15 NOTE — Assessment & Plan Note (Signed)
Chronic. 

## 2017-03-15 NOTE — Assessment & Plan Note (Signed)
History of discontinued tobacco abuse after cardiac arrest

## 2017-03-15 NOTE — Telephone Encounter (Signed)
Rx(s) sent to pharmacy electronically.  

## 2017-03-15 NOTE — Progress Notes (Signed)
03/15/2017 Jonathon Ryan   09/18/1956  [id number]  Primary Physician Jonathon CatchingNyland, Leonard, MD Primary Cardiologist: Jonathon GessJonathan J Khristopher Kapaun MD Jonathon Ryan  HPI:  The patient is a 61 year old, thin-appearing, married Caucasian male with no children who I last saw in the office 03/13/16. He had witnessed cardiac arrest with bystander CPR and defibrillation, arctic sun who presented for emergent cardiac catheterization by myself revealing left main 3-vessel disease and he ultimately underwent emergency coronary artery bypass grafting by Dr. Kathlee NationsPeter Van Ryan with LIMA to his LAD, vein to a PDA, sequential vein to OM1 and 2. His EF was 25% to 30%. He made a full neurologic recovery. He is wearing a LifeVest. His other problems include hypertension and hyperlipidemia and discontinued tobacco abuse which he stopped June 21, 2012. He also has peripheral vascular occlusive disease with high-grade left internal carotid artery stenosis followed by duplex ultrasound, high-grade right renal artery stenosis and right iliac stenosis though he denies claudication. We have gotten Dopplers on both his renals and his lower extremities. He had a Myoview stress test that showed an inferior scar without ischemia with an EF in the 25%-30% range confirmed by 2D echo. I performed carotid stenting on him under the "sapphire protocol" for high risk asymptomatic patients on October 14, 2012, with an excellent result. Followup Dopplers of his carotidis performed on October 28, 2012, were normal. Jonathon Ryan performed implantation of a BiV ICD on November 25, 2012, which has resulted in marked improvement in him clinically with regards to exercise tolerance. We continue to follow his carotid and renal Dopplers which have remained stable. His last carotid, renal and lower actually a partial Doppler studies performed in the last one 2 years have been stable. He denies claudication. He did unfortunately suffer a pocket infection of  his ICD requiring explant. He also had a subcutaneous ICD placed as followed by Jonathon Ryan. He denies chest pain or shortness of breath.   Current Outpatient Prescriptions  Medication Sig Dispense Refill  . aspirin 81 MG tablet Take 81 mg by mouth daily.    . carvedilol (COREG) 25 MG tablet TAKE ONE TABLET BY MOUTH TWICE DAILY WITH MEALS 180 tablet 0  . clopidogrel (PLAVIX) 75 MG tablet TAKE ONE TABLET BY MOUTH ONCE DAILY 90 tablet 3  . fenofibrate (TRICOR) 145 MG tablet Take 1 tablet (145 mg total) by mouth daily. 30 tablet 11  . lisinopril (PRINIVIL,ZESTRIL) 10 MG tablet Take 1 tablet (10 mg total) by mouth daily. 30 tablet 3  . LORazepam (ATIVAN) 0.5 MG tablet Take 0.5 mg by mouth 3 (three) times daily.     [name]. omega-3 acid ethyl esters (LOVAZA) 1 G capsule Take 1 g by mouth daily.    [name]. omeprazole (PRILOSEC) 40 MG capsule Take 40 mg by mouth daily.     . rosuvastatin (CRESTOR) 40 MG tablet Take 40 mg by mouth daily.     No current facility-administered medications for this visit.     No Known Allergies  Social History   Social History  . Marital status: Married    Spouse name: [name]  . Number of children: [name]  . Years of education: [name]   Occupational History  . Not on file.   Social History Main Topics  . Smoking status: Former Smoker    Packs/day: 1.50    Years: 40.00    Types: Cigarettes    Quit date: 06/11/2012  . Smokeless tobacco: Never Used  .  Alcohol use 8.4 oz/week    14 Cans of beer per week  . Drug use: No  . Sexual activity: Yes   Other Topics Concern  . Not on file   Social History Narrative  . No narrative on file     Review of Systems: General: negative for chills, fever, night sweats or weight changes.  Cardiovascular: negative for chest pain, dyspnea on exertion, edema, orthopnea, palpitations, paroxysmal nocturnal dyspnea or shortness of breath Dermatological: negative for rash Respiratory: negative for cough or wheezing Urologic: negative for  hematuria Abdominal: negative for nausea, vomiting, diarrhea, bright red blood per rectum, melena, or hematemesis Neurologic: negative for visual changes, syncope, or dizziness All other systems reviewed and are otherwise negative except as noted above.    Blood pressure (!) 156/87, pulse (!) 55, height 5\' 10"  (1.778 m), weight 177 lb 12.8 oz (80.6 kg).  General appearance: alert and no distress Neck: no adenopathy, no JVD, supple, symmetrical, trachea midline, thyroid not enlarged, symmetric, no tenderness/mass/nodules and Loud bilateral carotid bruits right greater than left Lungs: clear to auscultation bilaterally Heart: regular rate and rhythm, S1, S2 normal, no murmur, click, rub or gallop Extremities: extremities normal, atraumatic, no cyanosis or edema  EKG sinus bradycardia at 55 with left bundle branch block. I personally reviewed this EKG  ASSESSMENT AND PLAN:   Tobacco abuse History of discontinued tobacco abuse after cardiac arrest  HLD (hyperlipidemia) History of hyperlipidemia on fenofibrate and Crestor followed by his PCP  CAD, 90% LM, Total RCA, 80-90% CFX- S/P urgent CABG X 11 Jun 2012 History of CAD status post sudden cardiac death, witnessed arrest, bystander CPR or defibrillation and emergent cardiac catheterization by myself September 2013 revealing left main/three-vessel disease ultimately requiring coronary artery bypass grafting by Jonathon Ryan with a LIMA to the LAD, vein to the PDA and sequential vein to OM1 and 2. His EF has been in the 25-30% range. He denies chest pain or shortness of breath.  Cardiomyopathy, ischemic History of ischemic cardiomyopathy EF 25-30% range on appropriate medications without evidence of heart failure. He does have a subcutaneous ICD in place which has not delivered therapy. He is followed by Jonathon Ryan.  PVD, 90% Rt RAS, 80% Rt Iliac at cath History of peripheral arterial disease with known 90% right renal artery stenosis as well  as 80% right iliac stenosis demonstrated at the time of emergent in 2013. He denies claudication. We continue to follow this by duplex ultrasound.  Carotid artery stenosis, LICA stent 10/14/12 History of carotid artery disease status post carotid artery stenting by myself 10/14/12 in the "sapphire" protocol for high risk asymptomatic patients which we have been following by duplex ultrasound. He does have bilateral bruits on exam. We will recheck carotid Doppler studies.  Hypertension History of hypertension blood pressure measured 156/87. He is on carvedilol and lisinopril. Continue current meds at current dosing  LBBB (left bundle branch block) Chronic      Jonathon Gess MD [facility], [facility] 03/15/2017 9:59 AM

## 2017-04-01 ENCOUNTER — Other Ambulatory Visit: Payer: Self-pay | Admitting: Cardiovascular Disease

## 2017-04-01 NOTE — Telephone Encounter (Signed)
Rx(s) sent to pharmacy electronically.  

## 2017-04-03 ENCOUNTER — Ambulatory Visit (HOSPITAL_COMMUNITY)
Admission: RE | Admit: 2017-04-03 | Discharge: 2017-04-03 | Disposition: A | Discharge status: Home / Self Care | Payer: Medicare HMO | Source: Ambulatory Visit | Attending: Cardiovascular Disease | Admitting: Cardiovascular Disease

## 2017-04-03 ENCOUNTER — Ambulatory Visit (HOSPITAL_COMMUNITY)
Admission: RE | Admit: 2017-04-03 | Discharge: 2017-04-03 | Disposition: A | Discharge status: Home / Self Care | Payer: Medicare HMO | Source: Ambulatory Visit | Attending: Internal Medicine | Admitting: Internal Medicine

## 2017-04-03 DIAGNOSIS — Z951 Presence of aortocoronary bypass graft: Secondary | ICD-10-CM | POA: Insufficient documentation

## 2017-04-03 DIAGNOSIS — I6529 Occlusion and stenosis of unspecified carotid artery: Secondary | ICD-10-CM | POA: Insufficient documentation

## 2017-04-03 DIAGNOSIS — I10 Essential (primary) hypertension: Secondary | ICD-10-CM | POA: Insufficient documentation

## 2017-04-03 DIAGNOSIS — I739 Peripheral vascular disease, unspecified: Secondary | ICD-10-CM

## 2017-04-03 DIAGNOSIS — I6523 Occlusion and stenosis of bilateral carotid arteries: Secondary | ICD-10-CM | POA: Diagnosis not present

## 2017-04-03 DIAGNOSIS — I2510 Atherosclerotic heart disease of native coronary artery without angina pectoris: Secondary | ICD-10-CM | POA: Diagnosis not present

## 2017-04-03 DIAGNOSIS — Z87891 Personal history of nicotine dependence: Secondary | ICD-10-CM | POA: Diagnosis not present

## 2017-04-03 DIAGNOSIS — E785 Hyperlipidemia, unspecified: Secondary | ICD-10-CM | POA: Insufficient documentation

## 2017-04-05 ENCOUNTER — Telehealth: Payer: Self-pay | Admitting: Cardiovascular Disease

## 2017-04-05 NOTE — Telephone Encounter (Signed)
Follow Up ° ° ° ° °Following up on test results. Please call. °

## 2017-04-05 NOTE — Telephone Encounter (Signed)
Patient called back and results were given. Patient verbalized his understanding.

## 2017-04-08 ENCOUNTER — Other Ambulatory Visit: Payer: Self-pay | Admitting: Cardiovascular Disease

## 2017-04-08 DIAGNOSIS — I6523 Occlusion and stenosis of bilateral carotid arteries: Secondary | ICD-10-CM

## 2017-04-12 ENCOUNTER — Ambulatory Visit (HOSPITAL_COMMUNITY)
Admission: RE | Admit: 2017-04-12 | Discharge: 2017-04-12 | Disposition: A | Discharge status: Home / Self Care | Payer: Medicare HMO | Source: Ambulatory Visit | Attending: Cardiovascular Disease | Admitting: Cardiovascular Disease

## 2017-04-12 ENCOUNTER — Other Ambulatory Visit: Payer: Self-pay | Admitting: Cardiovascular Disease

## 2017-04-12 ENCOUNTER — Encounter (HOSPITAL_COMMUNITY): Payer: Medicare HMO

## 2017-04-12 DIAGNOSIS — I739 Peripheral vascular disease, unspecified: Secondary | ICD-10-CM

## 2017-04-12 DIAGNOSIS — I700 Atherosclerosis of aorta: Secondary | ICD-10-CM | POA: Diagnosis not present

## 2017-04-15 ENCOUNTER — Other Ambulatory Visit: Payer: Self-pay | Admitting: Cardiovascular Disease

## 2017-04-15 DIAGNOSIS — I701 Atherosclerosis of renal artery: Secondary | ICD-10-CM

## 2017-04-15 DIAGNOSIS — I739 Peripheral vascular disease, unspecified: Secondary | ICD-10-CM

## 2017-05-21 ENCOUNTER — Ambulatory Visit (INDEPENDENT_AMBULATORY_CARE_PROVIDER_SITE_OTHER): Payer: Medicare HMO | Admitting: *Deleted

## 2017-05-21 DIAGNOSIS — I255 Ischemic cardiomyopathy: Secondary | ICD-10-CM | POA: Diagnosis not present

## 2017-05-22 LAB — CUP PACEART REMOTE DEVICE CHECK
Date Time Interrogation Session: 20180814121000
Implantable Pulse Generator Implant Date: 20160616
MDC IDC LEAD IMPLANT DT: 20160616
MDC IDC LEAD LOCATION: 753860
MDC IDC MSMT BATTERY REMAINING PERCENTAGE: 76 %
Pulse Gen Serial Number: 116179

## 2017-05-22 NOTE — Progress Notes (Signed)
Remote defibrillator check.  

## 2017-05-27 ENCOUNTER — Ambulatory Visit (INDEPENDENT_AMBULATORY_CARE_PROVIDER_SITE_OTHER): Payer: Self-pay | Admitting: *Deleted

## 2017-05-27 ENCOUNTER — Telehealth: Payer: Self-pay | Admitting: Internal Medicine

## 2017-05-27 ENCOUNTER — Other Ambulatory Visit: Payer: Self-pay

## 2017-05-27 DIAGNOSIS — I255 Ischemic cardiomyopathy: Secondary | ICD-10-CM

## 2017-05-27 MED ORDER — CEPHALEXIN 500 MG PO CAPS: 500.0000 mg | ORAL_CAPSULE | Freq: Two times a day (BID) | ORAL | 0 refills | Status: DC

## 2017-05-27 MED ORDER — LISINOPRIL 10 MG PO TABS: 10.0000 mg | ORAL_TABLET | Freq: Every day | ORAL | 2 refills | Status: DC

## 2017-05-27 NOTE — Telephone Encounter (Signed)
Patient calling, states that in the area of the implant scar he has a spot about the size of a dime that is red and felt a little wet to the touch. Patient states that he has been having a stinging sensation for the past couple of weeks but this morning was the first time the spot looked this way.

## 2017-05-27 NOTE — Telephone Encounter (Signed)
Spoke with pt, pt agreeable to apt today at 3:30pm to have device site assessed.

## 2017-05-27 NOTE — Patient Instructions (Signed)
Follow-Up with [name] in 7-10 days~ someone will be calling to schedule that appointment. Call [phone] if site starts draining or if develop fever or chills.

## 2017-05-29 NOTE — Progress Notes (Signed)
Seen for spot on incision site, nickel size reddened area on edge of incision site. Site assessed by [name] recommended pt start Keflex and f/u with [name] on 8/30

## 2017-06-04 ENCOUNTER — Encounter: Payer: Self-pay | Admitting: Cardiology

## 2017-06-06 ENCOUNTER — Encounter: Payer: Self-pay | Admitting: Internal Medicine

## 2017-06-06 ENCOUNTER — Ambulatory Visit (INDEPENDENT_AMBULATORY_CARE_PROVIDER_SITE_OTHER): Payer: Medicare HMO | Admitting: Internal Medicine

## 2017-06-06 VITALS — BP 128/78 | HR 59 | Ht 70.0 in | Wt 180.0 lb

## 2017-06-06 DIAGNOSIS — I255 Ischemic cardiomyopathy: Secondary | ICD-10-CM

## 2017-06-06 NOTE — Progress Notes (Signed)
HPI Jonathon Ryan returns today for follow-up. He is a very pleasant 61 year old man with an ischemic cardiomyopathy chronic systolic heart failure, status post ICD insertion initially in 2014. In 2016, he developed a skin erosion and his device was removed. He had insertion of a subcutaneous ICD in 2016. He has done well until a few weeks ago when he noted thinning of the skin over his ICD. He was seen in our office and placed on antibiotics. He is improved. The soreness over the site has resolved. He denies fevers or chills. He has never had an ICD therapy. No Known Allergies   Current Outpatient Prescriptions  Medication Sig Dispense Refill  . aspirin 81 MG tablet Take 81 mg by mouth daily.    . carvedilol (COREG) 25 MG tablet TAKE ONE TABLET BY MOUTH TWICE DAILY WITH MEALS 180 tablet 0  . clopidogrel (PLAVIX) 75 MG tablet TAKE ONE TABLET BY MOUTH ONCE DAILY 90 tablet 3  . fenofibrate (TRICOR) 145 MG tablet TAKE ONE TABLET BY MOUTH ONCE DAILY 30 tablet 11  . lisinopril (PRINIVIL,ZESTRIL) 10 MG tablet Take 1 tablet (10 mg total) by mouth daily. 90 tablet 2  . LORazepam (ATIVAN) 0.5 MG tablet Take 0.5 mg by mouth 3 (three) times daily.     [name] omega-3 acid ethyl esters (LOVAZA) 1 G capsule Take 1 g by mouth daily.    [name] omeprazole (PRILOSEC) 40 MG capsule Take 40 mg by mouth daily.     . rosuvastatin (CRESTOR) 40 MG tablet Take 40 mg by mouth daily.     No current facility-administered medications for this visit.      Past Medical History:  Diagnosis Date  . Anxiety   . CAD (coronary artery disease) 9/13   s/p CABG  . Cardiac arrest due to underlying cardiac condition (HCC)    successfully rescucitated/ cooled  . Cardiac defibrillator in place   . Cerebrovascular disease 10/15/2012  . Chronic systolic dysfunction of left ventricle   . GERD (gastroesophageal reflux disease)   . Headache(784.0)   . Hyperlipidemia   . Hypertension   . Infection of pacemaker pocket (HCC)    of ICD  with subsequent explant  . Ischemic cardiomyopathy    s/p BiV ICD implant Feb 2014 - [address] [name] [place], [place] [id number] (serial # [id number]) right atrial lead and a [name] [place], model 5409-81X (serial number [id number]) right ventricular defibrillator lead. [name] Quartet model [phone] - 86 (serial number [id number]) LV lead. [name] Quadra Assura model [place] 2956 [phone] (serial Number [id number]) biventricular ICD.   [name] Left bundle branch block   . Peripheral arterial disease (HCC)   . PONV (postoperative nausea and vomiting)   . Renal artery stenosis (HCC)   . Shortness of breath dyspnea    with exertion  . Spinal headache     ROS:   All systems reviewed and negative except as noted in the HPI.   Past Surgical History:  Procedure Laterality Date  . BI-VENTRICULAR IMPLANTABLE CARDIOVERTER DEFIBRILLATOR [name] 11/25/2012   Procedure: BI-VENTRICULAR IMPLANTABLE CARDIOVERTER DEFIBRILLATOR  (CRT-D);  Surgeon: Hillis Range, MD;  Location[facility] C[facility] LAB;  Service: Cardiovascular;  Laterality: [name];  . CAROTID ANGIOGRAM Bilateral 10/14/2012   Procedure: CAROTID ANGIOGRAM;  Surgeon: Nada Libman, MD;  Location[facility] CATH LAB;  Service: Cardiovascular;  Laterality: Bilateral;  . CAROTID STENT INSERTION [name] 10/14/2012   Procedure: CAROTID STENT INSERTION;  Surgeon: Nada Libman, MD;  Location: [facility] CATH LAB;  Service: Cardiovascular;  Laterality: [name];  . COLONOSCOPY [name] 08/18/2015   Procedure: COLONOSCOPY;  Surgeon: Malissa Hippo, MD;  Location: [facility] ENDO SU[facility];  Service: Endoscopy;  Laterality: [name];  830  . CORONARY ARTERY BYPASS GRAFT  06/11/2012   Procedure: CORONARY ARTERY BYPASS GRAFTING (CABG);  Surgeon: [name], MD;  Location: [facility] OR;  Service: Open Heart Surgery;  Laterality: [name];  Coronary Artery Bypass Grafting times four using left internal mammary artery and right greater saphenous vein endoscopically harvested.  . EP IMPLANTABLE DEVICE [name] 03/24/2015    Procedure: SubQ ICD Implant;  Surgeon: Marinus Maw, MD;  Location: [facility] INVASIVE CV LAB;  Service: Cardiovascular;  Laterality: [name];  . ICD LEAD REMOVAL Left 10/04/2014   Procedure: REMOVAL Saint Jude ICD with lead;  Surgeon: Marinus Maw, MD;  Location: [facility] OR;  Service: Cardiovascular;  Laterality: Left;  Jonathon Ryan is back up for [employer]  . LEFT HEART CATHETERIZATION WITH CORONARY ANGIOGRAM [name] 06/11/2012   Procedure: LEFT HEART CATHETERIZATION WITH CORONARY ANGIOGRAM;  Surgeon: Runell Gess, MD;  Location: [facility] CATH LAB;  Service: Cardiovascular;  Laterality: [name];  . PERCUTANEOUS PLACEMENT INTRAVASCULAR STENT CERVICAL CAROTID ARTERY       Family History  Problem Relation Age of Onset  . Cancer Brother      Social History   Social History  . Marital status: Married    Spouse name: [name]  . Number of children: [name]  . Years of education: [name]   Occupational History  . Not on file.   Social History Main Topics  . Smoking status: Former Smoker    Packs/day: 1.50    Years: 40.00    Types: Cigarettes    Quit date: 06/11/2012  . Smokeless tobacco: Never Used  . Alcohol use 8.4 oz/week    14 Cans of beer per week  . Drug use: No  . Sexual activity: Yes   Other Topics Concern  . Not on file   Social History Narrative  . No narrative on file     BP 128/78   Pulse (!) 59   Ht 5\' 10"  (1.778 m)   Wt 180 lb (81.6 kg)   SpO2 98%   BMI 25.83 kg/m   Physical Exam:  Well appearing 61 year old man,NAD HEENT: Unremarkable Neck:  6 cm JVD, no thyromegally Lymphatics:  No adenopathy Back:  No CVA tenderness Lungs:  Clear, with no wheezes, rales, or rhonchi.ICD incision along the midaxillary line demonstrates a thinned area on the inferior border of the incision. There is no erythema and no drainage. HEART:  Regular rate rhythm, no murmurs, no rubs, no clicks Abd:  soft, positive bowel sounds, no organomegally, no rebound, no guarding Ext:  2 plus pulses, no edema, no cyanosis, no  clubbing Skin:  No rashes no nodules Neuro:  CN II through XII intact, motor grossly intact   DEVICE  His device was not interrogated today.  Assess/Plan: 1. Threatened erosion of the subcutaneous ICD - the patient is at risk for losing his device. He will continue and finish his antibiotic therapy. It is interesting that the same process appeared to occur in 2016 2 years after his initial implant. If the device comes to the skin, I would suggest removal of the system and no longer place another ICD. 2. Chronic systolic heart failure - his symptoms are class II. No change in medical therapy 3. Ischemic cardiomyopathy - he denies anginal symptoms and remains very active.  No change in medical therapy.  Jonathon Ryan, M.D.

## 2017-06-06 NOTE — Patient Instructions (Signed)
Medication Instructions:  Your physician recommends that you continue on your current medications as directed. Please refer to the Current Medication list given to you today.   Labwork: NONE  Testing/Procedures: NONE  Follow-Up: Your physician wants you to follow-up in: November with Dr. Ladona Ridgelaylor. You will receive a reminder letter in the mail two months in advance. If you don't receive a letter, please call our office to schedule the follow-up appointment.   Any Other Special Instructions Will Be Listed Below (If Applicable).     If you need a refill on your cardiac medications before your next appointment, please call your pharmacy.  Thank you for choosing [facility]!

## 2017-08-09 ENCOUNTER — Other Ambulatory Visit: Payer: Self-pay | Admitting: Cardiovascular Disease

## 2017-08-20 ENCOUNTER — Ambulatory Visit (INDEPENDENT_AMBULATORY_CARE_PROVIDER_SITE_OTHER): Payer: Medicare HMO | Admitting: *Deleted

## 2017-08-20 DIAGNOSIS — I255 Ischemic cardiomyopathy: Secondary | ICD-10-CM

## 2017-08-20 NOTE — Progress Notes (Signed)
Remote ICD transmission.   

## 2017-08-22 LAB — CUP PACEART REMOTE DEVICE CHECK
Battery Remaining Percentage: 73 %
Implantable Lead Implant Date: 20160616
Implantable Lead Model: 3401
Implantable Pulse Generator Implant Date: 20160616
MDC IDC LEAD LOCATION: 753860
MDC IDC PG SERIAL: 116179
MDC IDC SESS DTM: 20181113131600

## 2017-08-23 ENCOUNTER — Encounter: Payer: Self-pay | Admitting: Cardiology

## 2017-09-05 ENCOUNTER — Encounter: Payer: Self-pay | Admitting: Internal Medicine

## 2017-09-05 ENCOUNTER — Ambulatory Visit: Payer: Medicare HMO | Admitting: Internal Medicine

## 2017-09-05 VITALS — BP 136/76 | HR 56 | Ht 70.0 in | Wt 176.0 lb

## 2017-09-05 DIAGNOSIS — I255 Ischemic cardiomyopathy: Secondary | ICD-10-CM

## 2017-09-05 NOTE — Progress Notes (Signed)
HPI [name] returns today for ongoing evaluation of his ICD. He had an initial implant in 2014 but then developed a device infection 2 years after implant. His device was removed and after he had healed, he underwent insertion of an S-ICD. He initially did well but over 2 years after implant, he developed some thinning over his ICD site. He denies fever or chills. He was treated with anti-biotics and the thinning has subsided. He has felt well. He has never had an ICD therapy.  No Known Allergies   Current Outpatient Medications  Medication Sig Dispense Refill  . aspirin 81 MG tablet Take 81 mg by mouth daily.    . carvedilol (COREG) 25 MG tablet TAKE 1 TABLET BY MOUTH TWICE DAILY WITH  MEALS 180 tablet 0  . clopidogrel (PLAVIX) 75 MG tablet TAKE ONE TABLET BY MOUTH ONCE DAILY 90 tablet 3  . fenofibrate (TRICOR) 145 MG tablet TAKE ONE TABLET BY MOUTH ONCE DAILY 30 tablet 11  . lisinopril (PRINIVIL,ZESTRIL) 10 MG tablet Take 1 tablet (10 mg total) by mouth daily. 90 tablet 2  . LORazepam (ATIVAN) 0.5 MG tablet Take 0.5 mg by mouth 3 (three) times daily.     [name]. omega-3 acid ethyl esters (LOVAZA) 1 G capsule Take 1 g by mouth daily.    [name]. omeprazole (PRILOSEC) 40 MG capsule Take 40 mg by mouth daily.     . rosuvastatin (CRESTOR) 40 MG tablet Take 40 mg by mouth daily.     No current facility-administered medications for this visit.      Past Medical History:  Diagnosis Date  . Anxiety   . CAD (coronary artery disease) 9/13   s/p CABG  . Cardiac arrest due to underlying cardiac condition (HCC)    successfully rescucitated/ cooled  . Cardiac defibrillator in place   . Cerebrovascular disease 10/15/2012  . Chronic systolic dysfunction of left ventricle   . GERD (gastroesophageal reflux disease)   . Headache(784.0)   . Hyperlipidemia   . Hypertension   . Infection of pacemaker pocket (HCC)    of ICD with subsequent explant  . Ischemic cardiomyopathy    s/p BiV ICD implant Feb  2014 - [address] [name] [place], [place] [id number] (serial # [id number]) right atrial lead and a [name] [place], model 4782-95A7122-65Q (serial number [id number]) right ventricular defibrillator lead. [name] Quartet model [id number] - 86 (serial number [id number]) LV lead. [name] Quadra [place]ura model [place] [id number] [phone] (serial Number [id number]) biventricular ICD.   [name]. Left bundle branch block   . Peripheral arterial disease (HCC)   . PONV (postoperative nausea and vomiting)   . Renal artery stenosis (HCC)   . Shortness of breath dyspnea    with exertion  . Spinal headache     ROS:   All systems reviewed and negative except as noted in the HPI.   Past Surgical History:  Procedure Laterality Date  . BI-VENTRICULAR IMPLANTABLE CARDIOVERTER DEFIBRILLATOR [name] 11/25/2012   Procedure: BI-VENTRICULAR IMPLANTABLE CARDIOVERTER DEFIBRILLATOR  (CRT-D);  Surgeon: Jonathon RangeJames Allred, MD;  [facility];  Service: Cardiovascular;  Laterality: [name];  . CAROTID ANGIOGRAM Bilateral 10/14/2012   Procedure: CAROTID ANGIOGRAM;  Surgeon: Jonathon LibmanVance W Brabham, MD;  [facility];  Service: Cardiovascular;  Laterality: Bilateral;  . CAROTID STENT INSERTION [name] 10/14/2012   Procedure: CAROTID STENT INSERTION;  Surgeon: Jonathon LibmanVance W Brabham, MD;  [facility];  Service: Cardiovascular;  Laterality: [name];  .  COLONOSCOPY [name] 08/18/2015   Procedure: COLONOSCOPY;  Surgeon: Jonathon HippoNajeeb U Rehman, MD;  [facility];  Service: Endoscopy;  Laterality: [name];  830  . CORONARY ARTERY BYPASS GRAFT  06/11/2012   Procedure: CORONARY ARTERY BYPASS GRAFTING (CABG);  Surgeon: Jonathon PernaPeter Van Trigt, MD;  [facility];  Service: Open Heart Surgery;  Laterality: [name];  Coronary Artery Bypass Grafting times four using left internal mammary artery and right greater saphenous vein endoscopically harvested.  . EP IMPLANTABLE DEVICE [name] 03/24/2015   Procedure: SubQ ICD Implant;  Surgeon: Jonathon MawGregg W Taylor, MD;  [facility];   Service: Cardiovascular;  Laterality: [name];  . ICD LEAD REMOVAL Left 10/04/2014   Procedure: REMOVAL Saint Jude ICD with lead;  Surgeon: Jonathon MawGregg W Taylor, MD;  [facility];  Service: Cardiovascular;  Laterality: Left;  Jonathon MorasOwen is back up for Jonathon [employer]  . LEFT HEART CATHETERIZATION WITH CORONARY ANGIOGRAM [name] 06/11/2012   Procedure: LEFT HEART CATHETERIZATION WITH CORONARY ANGIOGRAM;  Surgeon: Jonathon GessJonathan J Berry, MD;  [facility];  Service: Cardiovascular;  Laterality: [name];  . PERCUTANEOUS PLACEMENT INTRAVASCULAR STENT CERVICAL CAROTID ARTERY       Family History  Problem Relation Age of Onset  . Cancer Brother      Social History   Socioeconomic History  . Marital status: Married    Spouse name: Not on file  . Number of children: Not on file  . Years of education: Not on file  . Highest education level: Not on file  Social Needs  . Financial resource strain: Not on file  . Food insecurity - worry: Not on file  . Food insecurity - inability: Not on file  . Transportation needs - medical: Not on file  . Transportation needs - non-medical: Not on file  Occupational History  . Not on file  Tobacco Use  . Smoking status: Former Smoker    Packs/day: 1.50    Years: 40.00    Pack years: 60.00    Types: Cigarettes    Last attempt to quit: 06/11/2012    Years since quitting: 5.2  . Smokeless tobacco: Never Used  Substance and Sexual Activity  . Alcohol use: Yes    Alcohol/week: 8.4 oz    Types: 14 Cans of beer per week  . Drug use: No  . Sexual activity: Yes  Other Topics Concern  . Not on file  Social History Narrative  . Not on file     BP 136/76 (BP Location: Right Arm)   Pulse (!) 56   Ht 5\' 10"  (1.778 m)   Wt 176 lb (79.8 kg)   SpO2 97%   BMI 25.25 kg/m   Physical Exam:  Well appearing NAD HEENT: Unremarkable Neck:  No JVD, no thyromegally Lymphatics:  No adenopathy Back:  No CVA tenderness Lungs:  Clear with no wheezes, ICD incision has a small area of  thin skin but no drainage or erythema. HEART:  Regular rate rhythm, no murmurs, no rubs, no clicks Abd:  soft, positive bowel sounds, no organomegally, no rebound, no guarding Ext:  2 plus pulses, no edema, no cyanosis, no clubbing Skin:  No rashes no nodules Neuro:  CN II through XII intact, motor grossly intact   DEVICE  Normal device function.  See PaceArt for details.   Assess/Plan: 1. Possible ICD infection - his progression has halted and he is without evidence of active infection. He will undergo watchful waiting. If he develops any drainage, he will have  his device removed with no plans for another.  2. ICM - he denies anginal symptoms. No change in meds. He will continue his current meds. 3. Chronic systolic heart failure - he will continue his current meds. He will maintain a low sodium diet.  Leonia Reeves.D.

## 2017-09-05 NOTE — Patient Instructions (Signed)
Medication Instructions:  Your physician recommends that you continue on your current medications as directed. Please refer to the Current Medication list given to you today.   Labwork: NONE  Testing/Procedures: NONE   Follow-Up: Your physician wants you to follow-up in: 6 Months with Dr. Taylor.  You will receive a reminder letter in the mail two months in advance. If you don't receive a letter, please call our office to schedule the follow-up appointment.   Any Other Special Instructions Will Be Listed Below (If Applicable).     If you need a refill on your cardiac medications before your next appointment, please call your pharmacy.  Thank you for choosing [facility]!   

## 2017-10-15 IMAGING — DX DG LUMBAR SPINE COMPLETE 4+V
5 series · 5 of 5 positions shown · non-contrast
Comparison: None.

CLINICAL DATA: Chronic low back pain with radiation and left leg,
initial encounter

EXAM:
LUMBAR SPINE - COMPLETE 4+ VIEW

[l-spine ap]
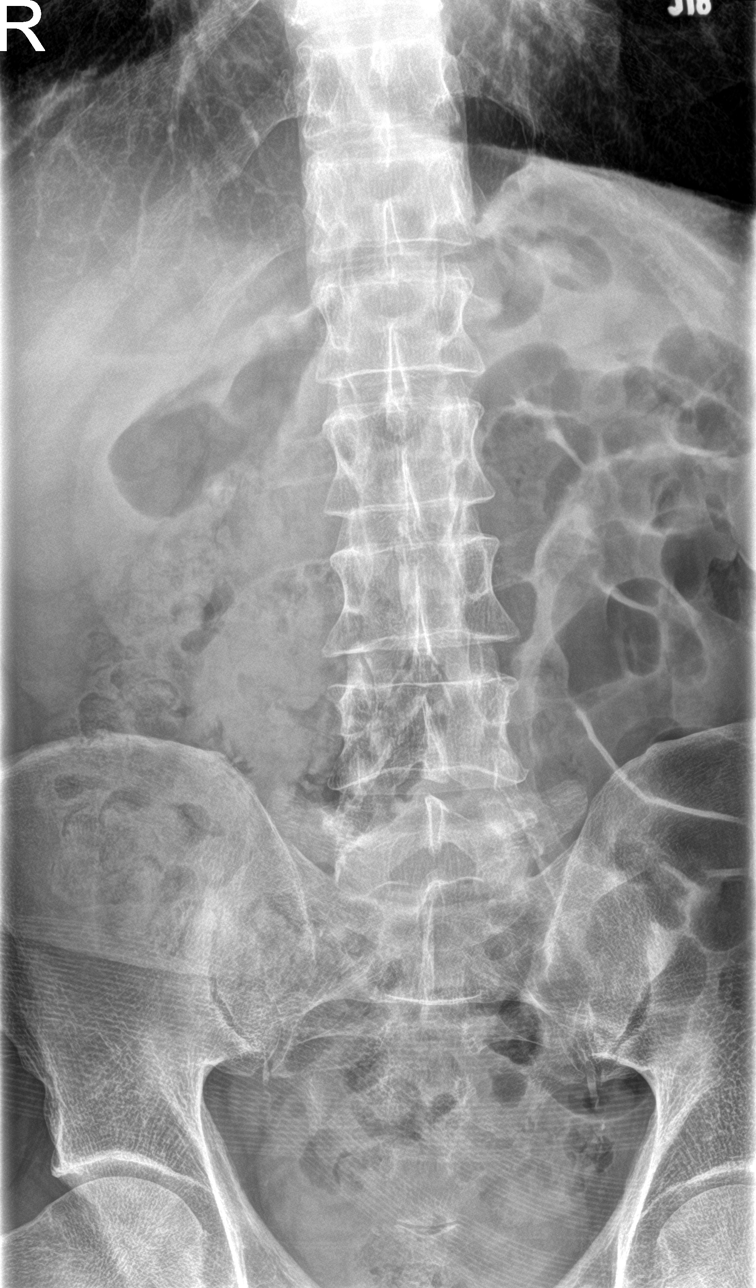

[l-spine obl (1 of 2)]
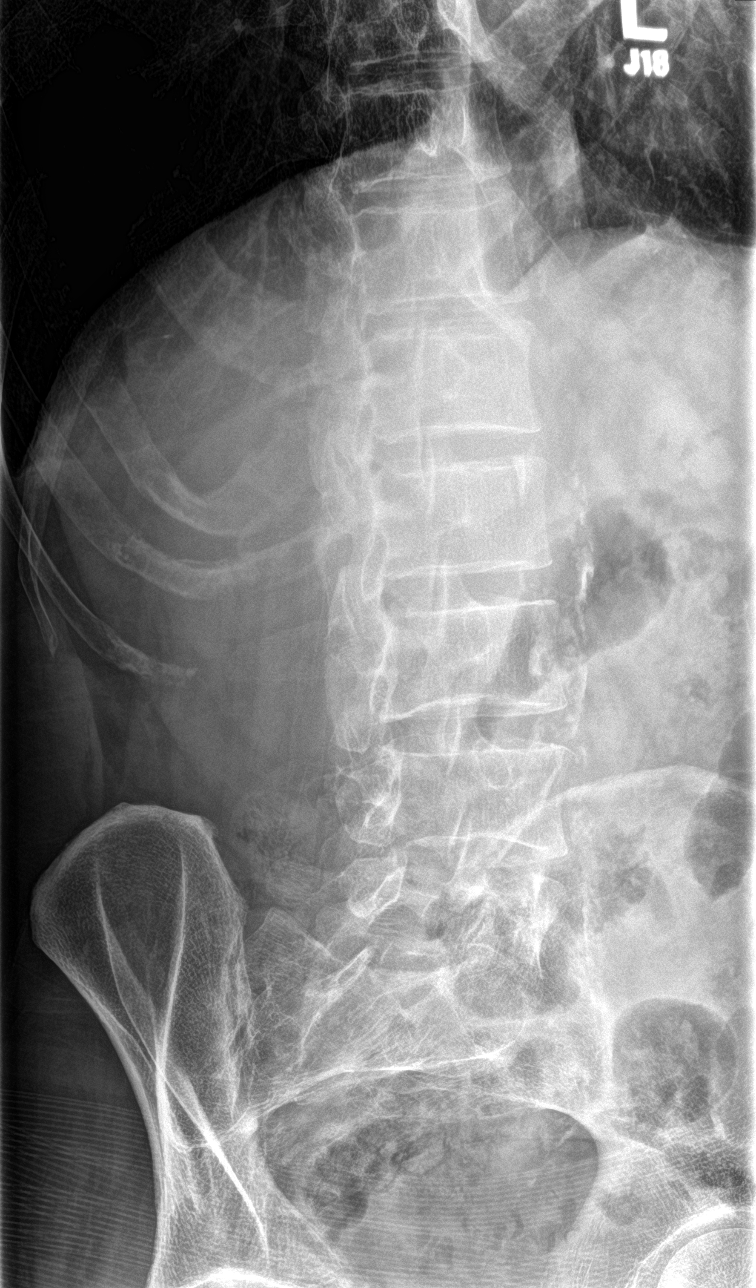

[l-spine obl (2 of 2)]
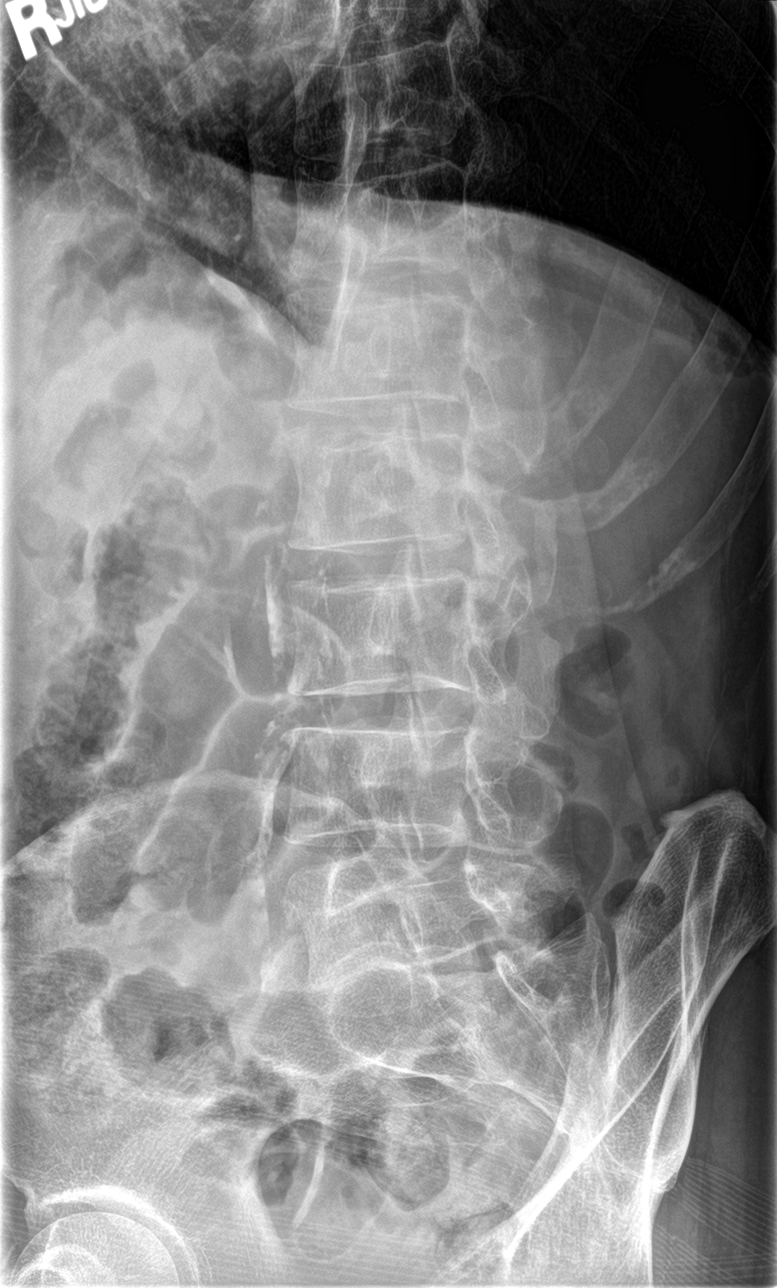

[l-spine lat]
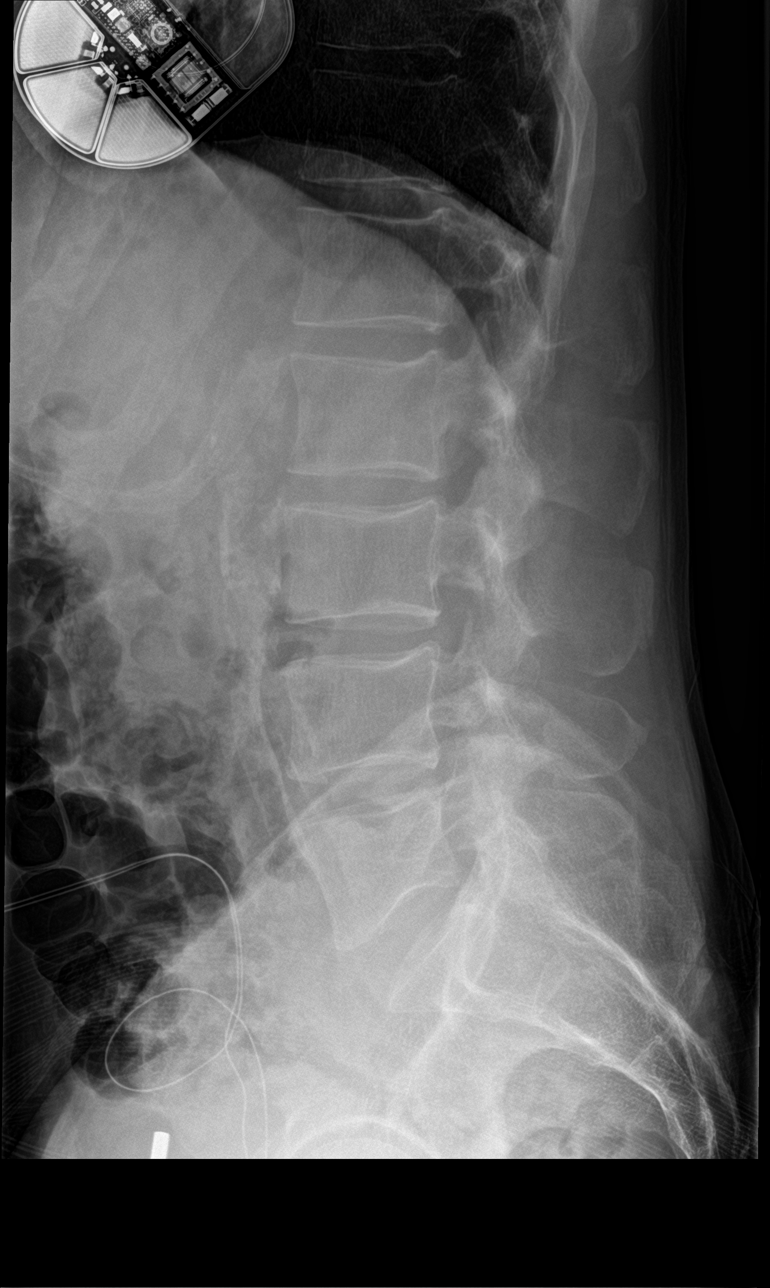

[l-spine spot]
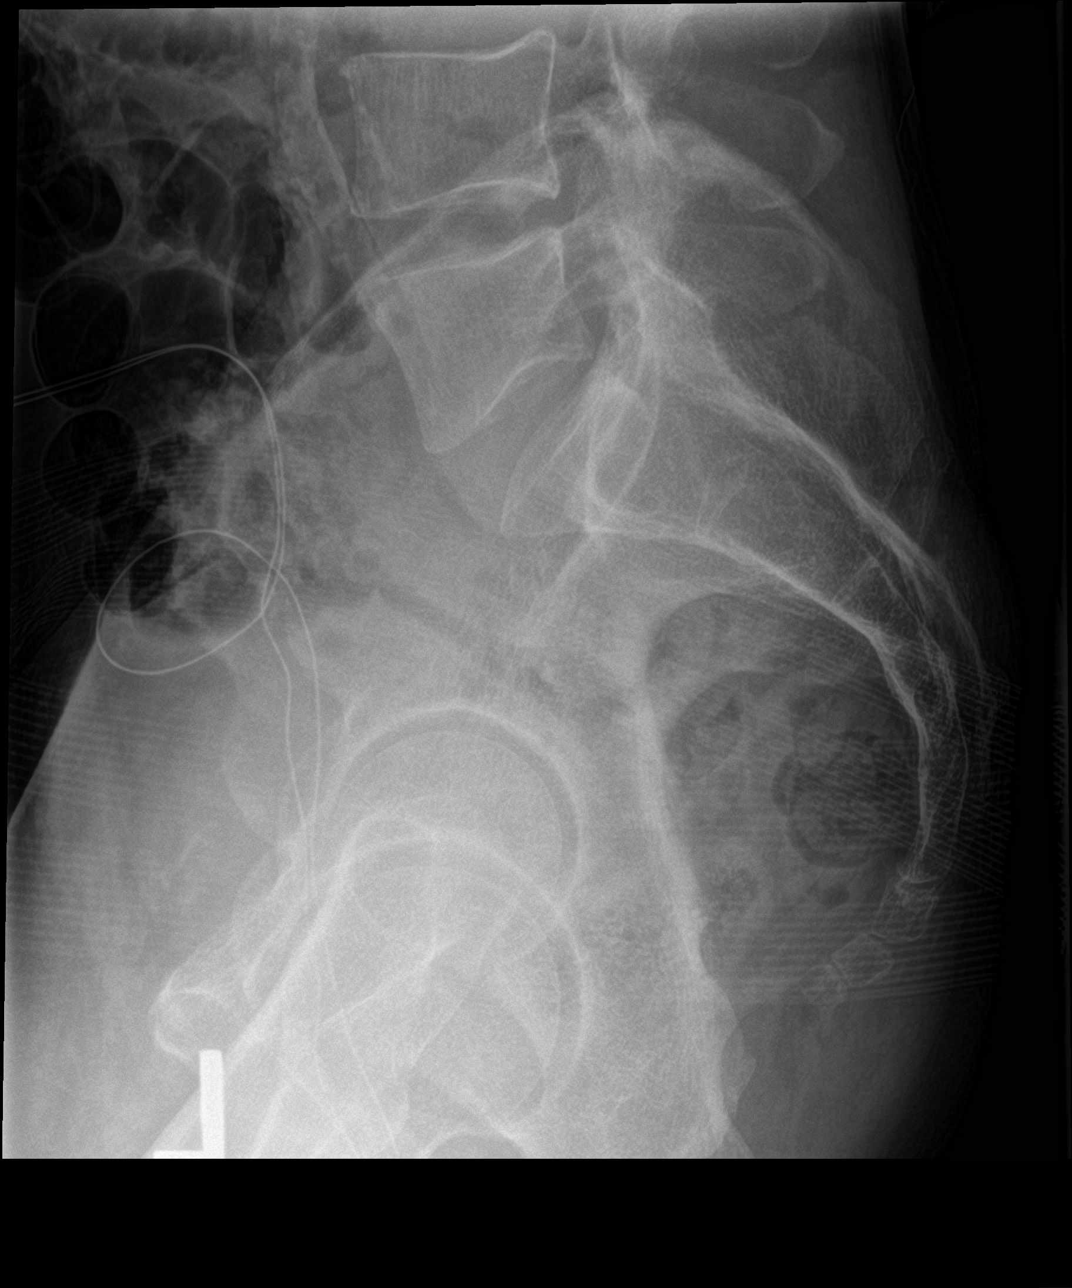

[5 of 5 positions shown; findings below may reference images not displayed]

FINDINGS: Five lumbar type vertebral bodies are well visualized. Vertebral
body height is well maintained. No spondylolysis or
spondylolisthesis is seen. Diffuse aortic calcifications are noted.
IMPRESSION: No acute abnormality noted.

## 2017-11-19 ENCOUNTER — Ambulatory Visit (INDEPENDENT_AMBULATORY_CARE_PROVIDER_SITE_OTHER): Payer: Medicare HMO | Admitting: *Deleted

## 2017-11-19 DIAGNOSIS — I255 Ischemic cardiomyopathy: Secondary | ICD-10-CM

## 2017-11-19 NOTE — Progress Notes (Signed)
Remote ICD transmission.   

## 2017-11-20 ENCOUNTER — Encounter: Payer: Self-pay | Admitting: Cardiology

## 2017-11-30 LAB — CUP PACEART REMOTE DEVICE CHECK
Battery Remaining Percentage: 70 %
Date Time Interrogation Session: 20190212123300
Implantable Lead Model: 3401
MDC IDC LEAD IMPLANT DT: 20160616
MDC IDC LEAD LOCATION: 753860
MDC IDC PG IMPLANT DT: 20160616
Pulse Gen Serial Number: 116179

## 2018-02-09 ENCOUNTER — Other Ambulatory Visit: Payer: Self-pay | Admitting: Cardiovascular Disease

## 2018-02-10 NOTE — Telephone Encounter (Signed)
REFILL 

## 2018-02-18 ENCOUNTER — Ambulatory Visit (INDEPENDENT_AMBULATORY_CARE_PROVIDER_SITE_OTHER): Payer: Medicare HMO | Admitting: *Deleted

## 2018-02-18 ENCOUNTER — Other Ambulatory Visit: Payer: Self-pay | Admitting: Cardiovascular Disease

## 2018-02-18 DIAGNOSIS — I255 Ischemic cardiomyopathy: Secondary | ICD-10-CM

## 2018-02-18 NOTE — Progress Notes (Signed)
Remote ICD transmission.   

## 2018-02-19 LAB — CUP PACEART REMOTE DEVICE CHECK
Implantable Lead Location: 753860
Implantable Lead Model: 3401
MDC IDC LEAD IMPLANT DT: 20160616
MDC IDC MSMT BATTERY REMAINING PERCENTAGE: 68 %
MDC IDC PG IMPLANT DT: 20160616
MDC IDC PG SERIAL: 116179
MDC IDC SESS DTM: 20190514120600

## 2018-02-21 ENCOUNTER — Encounter: Payer: Self-pay | Admitting: Cardiology

## 2018-03-04 ENCOUNTER — Other Ambulatory Visit: Payer: Self-pay | Admitting: Cardiovascular Disease

## 2018-03-07 ENCOUNTER — Ambulatory Visit (INDEPENDENT_AMBULATORY_CARE_PROVIDER_SITE_OTHER): Payer: Medicare HMO | Admitting: Internal Medicine

## 2018-03-07 ENCOUNTER — Encounter: Payer: Self-pay | Admitting: Internal Medicine

## 2018-03-07 VITALS — BP 136/80 | HR 53 | Ht 70.0 in | Wt 169.4 lb

## 2018-03-07 DIAGNOSIS — I255 Ischemic cardiomyopathy: Secondary | ICD-10-CM

## 2018-03-07 NOTE — Patient Instructions (Signed)
Medication Instructions:  Your physician recommends that you continue on your current medications as directed. Please refer to the Current Medication list given to you today.   Labwork: NONE   Testing/Procedures: NONE   Follow-Up: Your physician wants you to follow-up in: 1 Year with Dr. Taylor. You will receive a reminder letter in the mail two months in advance. If you don't receive a letter, please call our office to schedule the follow-up appointment.   Any Other Special Instructions Will Be Listed Below (If Applicable).     If you need a refill on your cardiac medications before your next appointment, please call your pharmacy.  Thank you for choosing [facility]!   

## 2018-03-07 NOTE — Progress Notes (Signed)
HPI Jonathon Ryan returns today for ICD followup. He had a Reunion SICD placed 3 years ago after presenting with a pocket erosion 2 years after initial device implant. He was found to have a near erosion of his SICD but this appears to have stabilized. No erythema or drainage. He denies chest pain, sob, or edema. No syncope. No Known Allergies   Current Outpatient Medications  Medication Sig Dispense Refill  . aspirin 81 MG tablet Take 81 mg by mouth daily.    . carvedilol (COREG) 25 MG tablet Take 12.5 tablets by mouth 2 (two) times daily.    . clopidogrel (PLAVIX) 75 MG tablet TAKE 1 TABLET BY MOUTH ONCE DAILY 90 tablet 3  . fenofibrate (TRICOR) 145 MG tablet TAKE ONE TABLET BY MOUTH ONCE DAILY 30 tablet 11  . lisinopril (PRINIVIL,ZESTRIL) 10 MG tablet TAKE 1 TABLET BY MOUTH ONCE DAILY 90 tablet 2  . LORazepam (ATIVAN) 0.5 MG tablet Take 0.5 mg by mouth 3 (three) times daily.     [name] omega-3 acid ethyl esters (LOVAZA) 1 G capsule Take 1 g by mouth daily.    [name] omeprazole (PRILOSEC) 40 MG capsule Take 40 mg by mouth daily.     . rosuvastatin (CRESTOR) 40 MG tablet Take 40 mg by mouth daily.     No current facility-administered medications for this visit.      Past Medical History:  Diagnosis Date  . Anxiety   . CAD (coronary artery disease) 9/13   s/p CABG  . Cardiac arrest due to underlying cardiac condition (HCC)    successfully rescucitated/ cooled  . Cardiac defibrillator in place   . Cerebrovascular disease 10/15/2012  . Chronic systolic dysfunction of left ventricle   . GERD (gastroesophageal reflux disease)   . Headache(784.0)   . Hyperlipidemia   . Hypertension   . Infection of pacemaker pocket (HCC)    of ICD with subsequent explant  . Ischemic cardiomyopathy    s/p BiV ICD implant Feb 2014 - [address] [name] [place], [place] 7829FA-21 (serial # [id number]) right atrial lead and a [name] [place], model 3086-57Q (serial number [id number]) right ventricular  defibrillator lead. [name] Quartet model [phone] - 86 (serial number [id number]) LV lead. [name] Quadra Assura model [place] 9528 [phone] (serial Number [id number]) biventricular ICD.   [name] Left bundle branch block   . Peripheral arterial disease (HCC)   . PONV (postoperative nausea and vomiting)   . Renal artery stenosis (HCC)   . Shortness of breath dyspnea    with exertion  . Spinal headache     ROS:   All systems reviewed and negative except as noted in the HPI.   Past Surgical History:  Procedure Laterality Date  . BI-VENTRICULAR IMPLANTABLE CARDIOVERTER DEFIBRILLATOR [name] 11/25/2012   Procedure: BI-VENTRICULAR IMPLANTABLE CARDIOVERTER DEFIBRILLATOR  (CRT-D);  Surgeon: Hillis Range, MD;  Location[facility] CATH LAB;  Service: Cardiovascular;  Laterality: [name];  . CAROTID ANGIOGRAM Bilateral 10/14/2012   Procedure: CAROTID ANGIOGRAM;  Surgeon: Nada Libman, MD;  Location: [facility] CATH LAB;  Service: Cardiovascular;  Laterality: Bilateral;  . CAROTID STENT INSERTION [name] 10/14/2012   Procedure: CAROTID STENT INSERTION;  Surgeon: Nada Libman, MD;  Location: [facility] CATH LAB;  Service: Cardiovascular;  Laterality: [name];  . COLONOSCOPY [name] 08/18/2015   Procedure: COLONOSCOPY;  Surgeon: Malissa Hippo, MD;  Location: [facility] ENDO SUITE;  Service: Endoscopy;  Laterality: [name];  830  . CORONARY ARTERY BYPASS GRAFT  06/11/2012   Procedure: CORONARY ARTERY BYPASS GRAFTING (CABG);  Surgeon: [name], MD;  Location: [facility] OR;  Service: Open Heart Surgery;  Laterality: [name];  Coronary Artery Bypass Grafting times four using left internal mammary artery and right greater saphenous vein endoscopically harvested.  . EP IMPLANTABLE DEVICE [name] 03/24/2015   Procedure: SubQ ICD Implant;  Surgeon: Marinus Maw, MD;  Location: [facility] INVASIVE CV LAB;  Service: Cardiovascular;  Laterality: [name];  . ICD LEAD REMOVAL Left 10/04/2014   Procedure: REMOVAL Saint Jude ICD with lead;  Surgeon: Marinus Maw, MD;  Location: [facility] OR;   Service: Cardiovascular;  Laterality: Left;  Jonathon Ryan is back up for [employer]  . LEFT HEART CATHETERIZATION WITH CORONARY ANGIOGRAM [name] 06/11/2012   Procedure: LEFT HEART CATHETERIZATION WITH CORONARY ANGIOGRAM;  Surgeon: Runell Gess, MD;  Location: [facility] CATH LAB;  Service: Cardiovascular;  Laterality: [name];  . PERCUTANEOUS PLACEMENT INTRAVASCULAR STENT CERVICAL CAROTID ARTERY       Family History  Problem Relation Age of Onset  . Cancer Brother      Social History   Socioeconomic History  . Marital status: Married    Spouse name: Not on file  . Number of children: Not on file  . Years of education: Not on file  . Highest education level: Not on file  Occupational History  . Not on file  Social Needs  . Financial resource strain: Not on file  . Food insecurity:    Worry: Not on file    Inability: Not on file  . Transportation needs:    Medical: Not on file    Non-medical: Not on file  Tobacco Use  . Smoking status: Former Smoker    Packs/day: 1.50    Years: 40.00    Pack years: 60.00    Types: Cigarettes    Last attempt to quit: 06/11/2012    Years since quitting: 5.7  . Smokeless tobacco: Never Used  Substance and Sexual Activity  . Alcohol use: Yes    Alcohol/week: 8.4 oz    Types: 14 Cans of beer per week  . Drug use: No  . Sexual activity: Yes  Lifestyle  . Physical activity:    Days per week: Not on file    Minutes per session: Not on file  . Stress: Not on file  Relationships  . Social connections:    Talks on phone: Not on file    Gets together: Not on file    Attends religious service: Not on file    Active member of club or organization: Not on file    Attends meetings of clubs or organizations: Not on file    Relationship status: Not on file  . Intimate partner violence:    Fear of current or ex partner: Not on file    Emotionally abused: Not on file    Physically abused: Not on file    Forced sexual activity: Not on file  Other Topics Concern  . Not  on file  Social History Narrative  . Not on file     BP 136/80   Pulse (!) 53   Ht  (1.778 m)   Wt 169 lb 6.4 oz (76.8 kg)   SpO2 99% Comment: on room air  BMI 24.31 kg/m   Physical Exam:  Well appearing 62 yo man, NAD HEENT: Unremarkable Neck:  No JVD, no thyromegally Lymphatics:  No adenopathy Back:  No CVA tenderness Lungs:  Clear with no wheezes, incision with  an area of thinning but stable HEART:  Regular rate rhythm, no murmurs, no rubs, no clicks Abd:  soft, positive bowel sounds, no organomegally, no rebound, no guarding Ext:  2 plus pulses, no edema, no cyanosis, no clubbing Skin:  No rashes no nodules Neuro:  CN II through XII intact, motor grossly intact  EKG - none  DEVICE  Normal device function.  See PaceArt for details.   Assess/Plan: 1. ICD - his Alyce Pagan is working normally. He has about 2/3 device longevity left. We will recheck in several months. 2. Chronic systolic heart failure - he has class 2 symptoms and will continue his current meds. 3. Infection - he has had no progression and has no symptoms of any systemic infection. He will undergo watchful waiting.  Leonia Reeves.D.

## 2018-03-18 ENCOUNTER — Ambulatory Visit: Payer: Medicare HMO | Admitting: Cardiovascular Disease

## 2018-03-18 ENCOUNTER — Encounter: Payer: Self-pay | Admitting: Cardiovascular Disease

## 2018-03-18 VITALS — BP 140/72 | HR 55 | Ht 70.0 in | Wt 170.0 lb

## 2018-03-18 DIAGNOSIS — I10 Essential (primary) hypertension: Secondary | ICD-10-CM | POA: Diagnosis not present

## 2018-03-18 DIAGNOSIS — E7800 Pure hypercholesterolemia, unspecified: Secondary | ICD-10-CM | POA: Diagnosis not present

## 2018-03-18 DIAGNOSIS — I739 Peripheral vascular disease, unspecified: Secondary | ICD-10-CM

## 2018-03-18 DIAGNOSIS — I6523 Occlusion and stenosis of bilateral carotid arteries: Secondary | ICD-10-CM | POA: Diagnosis not present

## 2018-03-18 DIAGNOSIS — I255 Ischemic cardiomyopathy: Secondary | ICD-10-CM

## 2018-03-18 DIAGNOSIS — I447 Left bundle-branch block, unspecified: Secondary | ICD-10-CM | POA: Diagnosis not present

## 2018-03-18 DIAGNOSIS — I701 Atherosclerosis of renal artery: Secondary | ICD-10-CM

## 2018-03-18 NOTE — Assessment & Plan Note (Signed)
History of ischemic heart myopathy with an EF in the 25 to 30% range.  He initially had a by V ICD placed by Dr. [name] but unfortunately he did have a pocket infection and ultimately had a single-chamber device replaced by Dr. Ladona Ridgelaylor which he is following closely.

## 2018-03-18 NOTE — Assessment & Plan Note (Signed)
History of carotid artery disease status post left ICA stenting by myself 10/14/2012 under the "Sapphire protocol which we follow by duplex ultrasound on annual basis.

## 2018-03-18 NOTE — Progress Notes (Signed)
03/18/2018 Jonathon Ryan   05/17/1956  [id number]  Primary Physician Jonathon CatchingNyland, Leonard, MD Primary Cardiologist: Jonathon GessJonathan J Brenda Cowher MD FACP, Jonathon CenterFACC, ProsperFAHA, Jonathon Ryan  HPI:  Jonathon Ryan is a 62 y.o.  thin-appearing, married Caucasian male with no children who I last saw in the office  03/15/2017. He had witnessed cardiac arrest with bystander CPR and defibrillation, arctic sun who presented for emergent cardiac catheterization by myself revealing left main 3-vessel disease and he ultimately underwent emergency coronary artery bypass grafting by Dr. Kathlee NationsPeter Van Ryan with LIMA to his LAD, vein to a PDA, sequential vein to OM1 and 2. His EF was 25% to 30%. He made a full neurologic recovery. He is wearing a LifeVest. His other problems include hypertension and hyperlipidemia and discontinued tobacco abuse which he stopped June 21, 2012. He also has peripheral vascular occlusive disease with high-grade left internal carotid artery stenosis followed by duplex ultrasound, high-grade right renal artery stenosis and right iliac stenosis though he denies claudication. We have gotten Dopplers on both his renals and his lower extremities. He had a Myoview stress test that showed an inferior scar without ischemia with an EF in the 25%-30% range confirmed by 2D echo. I performed carotid stenting on him under the "sapphire protocol" for high risk asymptomatic patients on October 14, 2012, with an excellent result. Followup Dopplers of his carotidis performed on October 28, 2012, were normal. Dr. Hillis RangeJames Ryan performed implantation of a BiV ICD on November 25, 2012, which has resulted in marked improvement in him clinically with regards to exercise tolerance. We continue to follow his carotid and renal Dopplers which have remained stable. His last carotid, renal and lower actually a partial Doppler studies performed in the last one 2 years have been stable. He denies claudication. He did unfortunately suffer a pocket infection  of his ICD requiring explant. He also had a subcutaneous ICD placed as followed by Jonathon Ryan. He denies chest pain or shortness of breath.  I saw him a year ago he is remained stable.  He continues to not deny chest pain or shortness of breath.     Current Meds  Medication Sig  . aspirin 81 MG tablet Take 81 mg by mouth daily.  . carvedilol (COREG) 25 MG tablet Take 12.5 tablets by mouth 2 (two) times daily.  . clopidogrel (PLAVIX) 75 MG tablet TAKE 1 TABLET BY MOUTH ONCE DAILY  . fenofibrate (TRICOR) 145 MG tablet TAKE ONE TABLET BY MOUTH ONCE DAILY  . lisinopril (PRINIVIL,ZESTRIL) 10 MG tablet TAKE 1 TABLET BY MOUTH ONCE DAILY  . LORazepam (ATIVAN) 0.5 MG tablet Take 0.5 mg by mouth 3 (three) times daily.   [name]. omega-3 acid ethyl esters (LOVAZA) 1 G capsule Take 1 g by mouth daily.  [name]. omeprazole (PRILOSEC) 40 MG capsule Take 40 mg by mouth daily.   . rosuvastatin (CRESTOR) 40 MG tablet Take 40 mg by mouth daily.     No Known Allergies  Social History   Socioeconomic History  . Marital status: Married    Spouse name: Not on file  . Number of children: Not on file  . Years of education: Not on file  . Highest education level: Not on file  Occupational History  . Not on file  Social Needs  . Financial resource strain: Not on file  . Food insecurity:    Worry: Not on file    Inability: Not on file  . Transportation needs:    Medical:  Not on file    Non-medical: Not on file  Tobacco Use  . Smoking status: Former Smoker    Packs/day: 1.50    Years: 40.00    Pack years: 60.00    Types: Cigarettes    Last attempt to quit: 06/11/2012    Years since quitting: 5.7  . Smokeless tobacco: Never Used  Substance and Sexual Activity  . Alcohol use: Yes    Alcohol/week: 8.4 oz    Types: 14 Cans of beer per week  . Drug use: No  . Sexual activity: Yes  Lifestyle  . Physical activity:    Days per week: Not on file    Minutes per session: Not on file  . Stress: Not on file    Relationships  . Social connections:    Talks on phone: Not on file    Gets together: Not on file    Attends religious service: Not on file    Active member of club or organization: Not on file    Attends meetings of clubs or organizations: Not on file    Relationship status: Not on file  . Intimate partner violence:    Fear of current or ex partner: Not on file    Emotionally abused: Not on file    Physically abused: Not on file    Forced sexual activity: Not on file  Other Topics Concern  . Not on file  Social History Narrative  . Not on file     Review of Systems: General: negative for chills, fever, night sweats or weight changes.  Cardiovascular: negative for chest pain, dyspnea on exertion, edema, orthopnea, palpitations, paroxysmal nocturnal dyspnea or shortness of breath Dermatological: negative for rash Respiratory: negative for cough or wheezing Urologic: negative for hematuria Abdominal: negative for nausea, vomiting, diarrhea, bright red blood per rectum, melena, or hematemesis Neurologic: negative for visual changes, syncope, or dizziness All other systems reviewed and are otherwise negative except as noted above.    Blood pressure 140/72, pulse (!) 55, height 5\' 10"  (1.778 m), weight 170 lb (77.1 kg).  General appearance: alert and no distress Neck: no adenopathy, no JVD, supple, symmetrical, trachea midline, thyroid not enlarged, symmetric, no tenderness/mass/nodules and Loud right carotid bruit Lungs: clear to auscultation bilaterally Heart: regular rate and rhythm, S1, S2 normal, no murmur, click, rub or gallop Extremities: extremities normal, atraumatic, no cyanosis or edema Pulses: 2+ and symmetric Skin: Skin color, texture, turgor normal. No rashes or lesions Neurologic: Alert and oriented X 3, normal strength and tone. Normal symmetric reflexes. Normal coordination and gait  EKG sinus bradycardia 55 with left bundle branch block.  I personally reviewed  this EKG.  ASSESSMENT AND PLAN:   STEMI (ST elevation myocardial infarction) (HCC) History of STEMI 9/13 bystander CPR, defibrillation, Arctic sun myself revealing left main/three-vessel disease and bypass grafting by Dr. Lovett Sox the LIMA to his LAD, vein to PDA, sequential vein to OM 1 and 2 full neurologic recovery.  His EF was in the 25 to 30% range.  Denies chest pain or shortness of breath.  A Myoview stress test performed subsequent to that did show inferior scar without ischemia.  HLD (hyperlipidemia) History of hyperlipidemia on statin therapy  Cardiomyopathy, ischemic History of ischemic heart myopathy with an EF in the 25 to 30% range.  He initially had a by V ICD placed by [name] but unfortunately he did have a pocket infection and ultimately had a single-chamber device replaced by [name] which he  is following closely.  PVD, 90% Rt RAS, 80% Rt Iliac at cath History of peripheral arterial disease with known right renal artery stenosis which we are following by duplex ultrasound.  Carotid artery stenosis, LICA stent 10/14/12 History of carotid artery disease status post left ICA stenting by myself 10/14/2012 under the "Sapphire protocol which we follow by duplex ultrasound on annual basis.  LBBB (left bundle branch block) Chronic  Hypertension History of essential hypertension with blood pressure measured at 140/72.  He is on carvedilol lisinopril.  Continue current meds at current dosing.      Jonathon Gess MD FACP,[name],[name], [facility] 03/18/2018 9:20 AM

## 2018-03-18 NOTE — Assessment & Plan Note (Signed)
History of STEMI 9/13 bystander CPR, defibrillation, Arctic sun myself revealing left main/three-vessel disease and bypass grafting by Dr. Lovett SoxPeter Vantrigt the LIMA to his LAD, vein to PDA, sequential vein to OM 1 and 2 full neurologic recovery.  His EF was in the 25 to 30% range.  Denies chest pain or shortness of breath.  A Myoview stress test performed subsequent to that did show inferior scar without ischemia.

## 2018-03-18 NOTE — Assessment & Plan Note (Signed)
History of essential hypertension with blood pressure measured at 140/72.  He is on carvedilol lisinopril.  Continue current meds at current dosing.

## 2018-03-18 NOTE — Assessment & Plan Note (Signed)
Chronic. 

## 2018-03-18 NOTE — Patient Instructions (Signed)
Medication Instructions:   NO CHANGE  Testing/Procedures:  Your physician has requested that you have a carotid duplex. This test is an ultrasound of the carotid arteries in your neck. It looks at blood flow through these arteries that supply the brain with blood. Allow one hour for this exam. There are no restrictions or special instructions.   Your physician has requested that you have a renal artery duplex. During this test, an ultrasound is used to evaluate blood flow to the kidneys. Allow one hour for this exam. Do not eat after midnight the day before and avoid carbonated beverages. Take your medications as you usually do.   Your physician has requested that you have a lower extremity arterial duplex. During this test, ultrasound are used to evaluate arterial blood flow in the legs. Allow one hour for this exam. There are no restrictions or special instructions.   Follow-Up:  Your physician wants you to follow-up in: ONE YEAR WITH DR San MorelleBERRY You will receive a reminder letter in the mail two months in advance. If you don't receive a letter, please call our office to schedule the follow-up appointment.   If you need a refill on your cardiac medications before your next appointment, please call your pharmacy.

## 2018-03-18 NOTE — Assessment & Plan Note (Signed)
History of peripheral arterial disease with known right renal artery stenosis which we are following by duplex ultrasound.

## 2018-03-18 NOTE — Assessment & Plan Note (Signed)
History of hyperlipidemia on statin therapy. 

## 2018-03-28 ENCOUNTER — Ambulatory Visit (HOSPITAL_COMMUNITY)
Admission: RE | Admit: 2018-03-28 | Discharge: 2018-03-28 | Disposition: A | Discharge status: Home / Self Care | Payer: Medicare HMO | Source: Ambulatory Visit | Attending: Cardiovascular Disease | Admitting: Cardiovascular Disease

## 2018-03-28 DIAGNOSIS — Z87891 Personal history of nicotine dependence: Secondary | ICD-10-CM | POA: Insufficient documentation

## 2018-03-28 DIAGNOSIS — E785 Hyperlipidemia, unspecified: Secondary | ICD-10-CM | POA: Insufficient documentation

## 2018-03-28 DIAGNOSIS — I6523 Occlusion and stenosis of bilateral carotid arteries: Secondary | ICD-10-CM

## 2018-03-28 DIAGNOSIS — I2510 Atherosclerotic heart disease of native coronary artery without angina pectoris: Secondary | ICD-10-CM | POA: Insufficient documentation

## 2018-03-28 DIAGNOSIS — Z955 Presence of coronary angioplasty implant and graft: Secondary | ICD-10-CM | POA: Diagnosis not present

## 2018-03-28 DIAGNOSIS — Z09 Encounter for follow-up examination after completed treatment for conditions other than malignant neoplasm: Secondary | ICD-10-CM | POA: Diagnosis not present

## 2018-03-28 DIAGNOSIS — I701 Atherosclerosis of renal artery: Secondary | ICD-10-CM | POA: Diagnosis not present

## 2018-03-28 DIAGNOSIS — I10 Essential (primary) hypertension: Secondary | ICD-10-CM | POA: Insufficient documentation

## 2018-03-31 ENCOUNTER — Other Ambulatory Visit: Payer: Self-pay | Admitting: *Deleted

## 2018-03-31 ENCOUNTER — Other Ambulatory Visit: Payer: Self-pay | Admitting: Cardiovascular Disease

## 2018-03-31 DIAGNOSIS — I6523 Occlusion and stenosis of bilateral carotid arteries: Secondary | ICD-10-CM

## 2018-03-31 NOTE — Telephone Encounter (Signed)
Rx request sent to pharmacy.  

## 2018-04-01 ENCOUNTER — Ambulatory Visit (HOSPITAL_COMMUNITY)
Admission: RE | Admit: 2018-04-01 | Discharge: 2018-04-01 | Disposition: A | Discharge status: Home / Self Care | Payer: Medicare HMO | Source: Ambulatory Visit | Attending: Cardiovascular Disease | Admitting: Cardiovascular Disease

## 2018-04-01 DIAGNOSIS — I739 Peripheral vascular disease, unspecified: Secondary | ICD-10-CM | POA: Diagnosis not present

## 2018-04-02 LAB — CUP PACEART INCLINIC DEVICE CHECK
Date Time Interrogation Session: 20190626095614
Implantable Lead Implant Date: 20160616
Implantable Lead Location: 753860
Implantable Lead Model: 3401
MDC IDC PG IMPLANT DT: 20160616
MDC IDC PG SERIAL: 116179

## 2018-04-08 ENCOUNTER — Other Ambulatory Visit: Payer: Self-pay | Admitting: *Deleted

## 2018-04-08 DIAGNOSIS — I739 Peripheral vascular disease, unspecified: Secondary | ICD-10-CM

## 2018-05-20 ENCOUNTER — Ambulatory Visit (INDEPENDENT_AMBULATORY_CARE_PROVIDER_SITE_OTHER): Payer: Medicare HMO | Admitting: *Deleted

## 2018-05-20 DIAGNOSIS — I469 Cardiac arrest, cause unspecified: Secondary | ICD-10-CM

## 2018-05-20 DIAGNOSIS — I255 Ischemic cardiomyopathy: Secondary | ICD-10-CM | POA: Diagnosis not present

## 2018-05-20 NOTE — Progress Notes (Signed)
Remote ICD transmission.   

## 2018-06-15 LAB — CUP PACEART REMOTE DEVICE CHECK
Date Time Interrogation Session: 20190908135827
Implantable Lead Implant Date: 20160616
Implantable Lead Location: 753860
Implantable Lead Model: 3401
Implantable Pulse Generator Implant Date: 20160616
MDC IDC PG SERIAL: 116179

## 2018-08-09 ENCOUNTER — Other Ambulatory Visit: Payer: Self-pay | Admitting: Cardiovascular Disease

## 2018-08-19 ENCOUNTER — Ambulatory Visit (INDEPENDENT_AMBULATORY_CARE_PROVIDER_SITE_OTHER): Payer: Medicare HMO | Admitting: *Deleted

## 2018-08-19 DIAGNOSIS — I255 Ischemic cardiomyopathy: Secondary | ICD-10-CM

## 2018-08-19 DIAGNOSIS — I469 Cardiac arrest, cause unspecified: Secondary | ICD-10-CM

## 2018-08-19 NOTE — Progress Notes (Signed)
Remote ICD transmission.   

## 2018-10-19 LAB — CUP PACEART REMOTE DEVICE CHECK
Battery Remaining Percentage: 62 %
Date Time Interrogation Session: 20191112123300
Implantable Lead Implant Date: 20160616
Implantable Pulse Generator Implant Date: 20160616
MDC IDC LEAD LOCATION: 753860
Pulse Gen Serial Number: 116179

## 2018-11-18 ENCOUNTER — Ambulatory Visit (INDEPENDENT_AMBULATORY_CARE_PROVIDER_SITE_OTHER): Payer: Medicare HMO

## 2018-11-18 DIAGNOSIS — I469 Cardiac arrest, cause unspecified: Secondary | ICD-10-CM

## 2018-11-18 DIAGNOSIS — I255 Ischemic cardiomyopathy: Secondary | ICD-10-CM | POA: Diagnosis not present

## 2018-11-19 LAB — CUP PACEART REMOTE DEVICE CHECK
Date Time Interrogation Session: 20200212031900
Implantable Lead Location: 753860
Implantable Lead Model: 3401
Implantable Pulse Generator Implant Date: 20160616
MDC IDC LEAD IMPLANT DT: 20160616
MDC IDC MSMT BATTERY REMAINING PERCENTAGE: 59 %
MDC IDC PG SERIAL: 116179

## 2018-11-22 ENCOUNTER — Other Ambulatory Visit: Payer: Self-pay | Admitting: Cardiovascular Disease

## 2018-11-22 DIAGNOSIS — I255 Ischemic cardiomyopathy: Secondary | ICD-10-CM

## 2018-12-01 NOTE — Progress Notes (Signed)
Remote ICD transmission.   

## 2019-02-17 ENCOUNTER — Ambulatory Visit (INDEPENDENT_AMBULATORY_CARE_PROVIDER_SITE_OTHER): Payer: Medicare HMO | Admitting: *Deleted

## 2019-02-17 ENCOUNTER — Other Ambulatory Visit: Payer: Self-pay

## 2019-02-17 DIAGNOSIS — I255 Ischemic cardiomyopathy: Secondary | ICD-10-CM | POA: Diagnosis not present

## 2019-02-17 DIAGNOSIS — I469 Cardiac arrest, cause unspecified: Secondary | ICD-10-CM

## 2019-02-18 ENCOUNTER — Telehealth: Payer: Self-pay

## 2019-02-18 NOTE — Telephone Encounter (Signed)
Left message for patient to remind of missed remote transmission.  

## 2019-02-19 ENCOUNTER — Other Ambulatory Visit: Payer: Self-pay | Admitting: Cardiovascular Disease

## 2019-02-19 DIAGNOSIS — I255 Ischemic cardiomyopathy: Secondary | ICD-10-CM

## 2019-02-19 LAB — CUP PACEART REMOTE DEVICE CHECK
Battery Remaining Percentage: 57 %
Date Time Interrogation Session: 20200513220300
Implantable Lead Implant Date: 20160616
Implantable Lead Location: 753860
Implantable Lead Model: 3401
Implantable Pulse Generator Implant Date: 20160616
Pulse Gen Serial Number: 116179

## 2019-03-02 ENCOUNTER — Other Ambulatory Visit: Payer: Self-pay | Admitting: Internal Medicine

## 2019-03-06 NOTE — Progress Notes (Signed)
Remote ICD transmission.   

## 2019-03-12 ENCOUNTER — Telehealth: Payer: Self-pay | Admitting: Cardiovascular Disease

## 2019-03-12 NOTE — Telephone Encounter (Signed)
smrtphone/ my chart active/ consent/ pre reg completed

## 2019-03-18 ENCOUNTER — Telehealth: Payer: Self-pay

## 2019-03-18 ENCOUNTER — Telehealth (INDEPENDENT_AMBULATORY_CARE_PROVIDER_SITE_OTHER): Payer: Medicare HMO | Admitting: Cardiovascular Disease

## 2019-03-18 VITALS — BP 140/70 | HR 69 | Ht 70.0 in | Wt 156.0 lb

## 2019-03-18 DIAGNOSIS — I10 Essential (primary) hypertension: Secondary | ICD-10-CM

## 2019-03-18 DIAGNOSIS — Z95810 Presence of automatic (implantable) cardiac defibrillator: Secondary | ICD-10-CM

## 2019-03-18 DIAGNOSIS — Z8674 Personal history of sudden cardiac arrest: Secondary | ICD-10-CM

## 2019-03-18 DIAGNOSIS — I2510 Atherosclerotic heart disease of native coronary artery without angina pectoris: Secondary | ICD-10-CM

## 2019-03-18 DIAGNOSIS — Z95820 Peripheral vascular angioplasty status with implants and grafts: Secondary | ICD-10-CM

## 2019-03-18 DIAGNOSIS — E782 Mixed hyperlipidemia: Secondary | ICD-10-CM

## 2019-03-18 DIAGNOSIS — I255 Ischemic cardiomyopathy: Secondary | ICD-10-CM

## 2019-03-18 DIAGNOSIS — I701 Atherosclerosis of renal artery: Secondary | ICD-10-CM

## 2019-03-18 DIAGNOSIS — I6522 Occlusion and stenosis of left carotid artery: Secondary | ICD-10-CM

## 2019-03-18 DIAGNOSIS — I739 Peripheral vascular disease, unspecified: Secondary | ICD-10-CM

## 2019-03-18 DIAGNOSIS — I2583 Coronary atherosclerosis due to lipid rich plaque: Secondary | ICD-10-CM

## 2019-03-18 DIAGNOSIS — I519 Heart disease, unspecified: Secondary | ICD-10-CM

## 2019-03-18 NOTE — Progress Notes (Signed)
Virtual Visit via Telephone Note   This visit type was conducted due to national recommendations for restrictions regarding the COVID-19 Pandemic (e.g. social distancing) in an effort to limit this patient's exposure and mitigate transmission in our community.  Due to his co-morbid illnesses, this patient is at least at moderate risk for complications without adequate follow up.  This format is felt to be most appropriate for this patient at this time.  The patient did not have access to video technology/had technical difficulties with video requiring transitioning to audio format only (telephone).  All issues noted in this document were discussed and addressed.  No physical exam could be performed with this format.  Please refer to the patient's chart for his  consent to telehealth for [facility].   Date:  03/18/2019   ID:  Jonathon PootBenny E Marciel, DOB 07/02/1956, MRN [id number]  Patient Location: Home Provider Location: Home  PCP:  Joette CatchingNyland, Leonard, MD  Cardiologist: Dr. Nanetta BattyJonathan Javarie Crisp Electrophysiologist:  Lewayne BuntingGregg Taylor, MD   Evaluation Performed:  Follow-Up Visit  Chief Complaint: Annual follow-up CAD and ischemic cardiomyopathy  History of Present Illness:    Jonathon Ryan is a 63 y.o.  thin-appearing, married Caucasian male with no children who I last saw in the office  03/18/2018. He had witnessed cardiac arrest with bystander CPR and defibrillation, arctic sun who presented for emergent cardiac catheterization by myself revealing left main 3-vessel disease and he ultimately underwent emergency coronary artery bypass grafting by Dr. Kathlee NationsPeter Van Trigt with LIMA to his LAD, vein to a PDA, sequential vein to OM1 and 2. His EF was 25% to 30%. He made a full neurologic recovery. He is wearing a LifeVest. His other problems include hypertension and hyperlipidemia and discontinued tobacco abuse which he stopped June 21, 2012. He also has peripheral vascular occlusive disease with high-grade left  internal carotid artery stenosis followed by duplex ultrasound, high-grade right renal artery stenosis and right iliac stenosis though he denies claudication. We have gotten Dopplers on both his renals and his lower extremities. He had a Myoview stress test that showed an inferior scar without ischemia with an EF in the 25%-30% range confirmed by 2D echo. I performed carotid stenting on him under the "sapphire protocol" for high risk asymptomatic patients on October 14, 2012, with an excellent result. Followup Dopplers of his carotidis performed on October 28, 2012, were normal. Dr. [name] performed implantation of a BiV ICD on November 25, 2012, which has resulted in marked improvement in him clinically with regards to exercise tolerance. We continue to follow his carotid and renal Dopplers which have remained stable.His last carotid, renal and lower actually a partial Doppler studies performed in the last one 2 years have been stable.He denies claudication. He did unfortunately suffer a pocket infection of his ICD requiring explant. He also had a subcutaneous ICD placed as followed by Dr. Ladona Ridgelaylor.  I saw him a year ago he is remained stable.  He continues to not deny chest pain or shortness of breath.  Since I saw him 12 months ago he is remained stable.  He denies chest pain or shortness of breath.  He does not get out and play golf without limitation.  He does have a subcutaneous ICD and has had no discharges.  The patient does not have symptoms concerning for COVID-19 infection (fever, chills, cough, or new shortness of breath).    Past Medical History:  Diagnosis Date  . Anxiety   . CAD (coronary  artery disease) 9/13   s/p CABG  . Cardiac arrest due to underlying cardiac condition ([place])    successfully rescucitated/ cooled  . Cardiac defibrillator in place   . Cerebrovascular disease 10/15/2012  . Chronic systolic dysfunction of left ventricle   . GERD (gastroesophageal reflux disease)    . Headache(784.0)   . Hyperlipidemia   . Hypertension   . Infection of pacemaker pocket ([place])    of ICD with subsequent explant  . Ischemic cardiomyopathy    s/p BiV ICD implant Feb 2014 - [place], [place] 2088TC-52 (serial # [id number]) right atrial lead and a [place], model 7122-65Q (serial number [id number]) right ventricular defibrillator lead. [place] - 86 (serial number [id number]) LV lead. [place] 3365 [phone] (serial Number [id number]) biventricular ICD.   [name] Left bundle branch block   . Peripheral arterial disease ([place])   . PONV (postoperative nausea and vomiting)   . Renal artery stenosis ([place])   . Shortness of breath dyspnea    with exertion  . Spinal headache    Past Surgical History:  Procedure Laterality Date  . BI-VENTRICULAR IMPLANTABLE CARDIOVERTER DEFIBRILLATOR [name] 11/25/2012   Procedure: BI-VENTRICULAR IMPLANTABLE CARDIOVERTER DEFIBRILLATOR  (CRT-D);  Surgeon: Thompson Grayer, MD;  Location[facility] LAB;  Service: Cardiovascular;  Laterality: [name];  . CAROTID ANGIOGRAM Bilateral 10/14/2012   Procedure: CAROTID ANGIOGRAM;  Surgeon: Serafina Mitchell, MD;  Location: [facility] CATH LAB;  Service: Cardiovascular;  Laterality: Bilateral;  . CAROTID STENT INSERTION [name] 10/14/2012   Procedure: CAROTID STENT INSERTION;  Surgeon: Serafina Mitchell, MD;  Location: [facility] CATH LAB;  Service: Cardiovascular;  Laterality: [name];  . COLONOSCOPY [name] 08/18/2015   Procedure: COLONOSCOPY;  Surgeon: Rogene Houston, MD;  [facility];  Service: Endoscopy;  Laterality: [name];  830  . CORONARY ARTERY BYPASS GRAFT  06/11/2012   Procedure: CORONARY ARTERY BYPASS GRAFTING (CABG);  Surgeon: Jonathon Poot, MD;  Location: [place];  Service: Open Heart Surgery;  Laterality: [name];  Coronary Artery Bypass Grafting times four using left internal mammary artery and right greater saphenous vein endoscopically harvested.  . EP IMPLANTABLE  DEVICE [name] 03/24/2015   Procedure: SubQ ICD Implant;  Surgeon: Evans Lance, MD;  Location: [place] CV LAB;  Service: Cardiovascular;  Laterality: [name];  . ICD LEAD REMOVAL Left 10/04/2014   Procedure: [place] Jude ICD with lead;  Surgeon: Evans Lance, MD;  Location: [place];  Service: Cardiovascular;  Laterality: Left;  [name] is back up for [employer]  . LEFT HEART CATHETERIZATION WITH CORONARY ANGIOGRAM [name] 06/11/2012   Procedure: LEFT HEART CATHETERIZATION WITH CORONARY ANGIOGRAM;  Surgeon: Lorretta Harp, MD;  Location[facility] C[facility] LAB;  Service: Cardiovascular;  Laterality: [name];  . PERCUTANEOUS PLACEMENT INTRAVASCULAR STENT CERVICAL CAROTID ARTERY       No outpatient medications have been marked as taking for the 03/18/19 encounter (Appointment) with Lorretta Harp, MD.     Allergies:   Patient has no known allergies.   Social History   Tobacco Use  . Smoking status: Former Smoker    Packs/day: 1.50    Years: 40.00    Pack years: 60.00    Types: Cigarettes    Last attempt to quit: 06/11/2012    Years since quitting: 6.7  . Smokeless tobacco: Never Used  Substance Use Topics  . Alcohol use: Yes    Alcohol/week: 14.0 standard drinks  Types: 14 Cans of beer per week  . Drug use: No     Family Hx: The patient's family history includes Cancer in his brother.  ROS:   Please see the history of present illness.     All other systems reviewed and are negative.   Prior CV studies:   The following studies were reviewed today:  None  Labs/Other Tests and Data Reviewed:    EKG:  No ECG reviewed.  Recent Labs: No results found for requested labs within last 8760 hours.   Recent Lipid Panel Lab Results  Component Value Date/Time   CHOL 151 01/06/2014 01:42 PM   TRIG 354 (H) 01/06/2014 01:42 PM   HDL 25 (L) 01/06/2014 01:42 PM   CHOLHDL 6.0 01/06/2014 01:42 PM   LDLCALC 55 01/06/2014 01:42 PM    Wt Readings from Last 3 Encounters:  03/18/18 170 lb (77.1 kg)   03/07/18 169 lb 6.4 oz (76.8 kg)  09/05/17 176 lb (79.8 kg)     Objective:    Vital Signs:  There were no vitals taken for this visit.   VITAL SIGNS:  reviewed a complete physical exam was not performed today since this was a virtual telemedicine phone visit  ASSESSMENT & PLAN:    1. Coronary artery disease- history of CAD status post witnessed sudden cardiac death September 2013 with urgent cath revealing left main/three-vessel disease.  He ultimately underwent CABG by Dr. Lovett SoxPeter Vantrigt with a LIMA to his LAD, vein to PDA and sequential vein to OM1 and OM 2.  He denies chest pain. 2. Ischemic cardiomyopathy- history of ischemic heart myopathy with echo performed last 02/23/2015 revealing EF of 30%.  He remains on carvedilol and lisinopril.  He has no symptoms of heart failure 3. ICD implantation- history of ICD implant by Dr. [name] 11/25/2012 (biventricular device) which ultimately had a pocket infection was explanted.  He had a subcutaneous ICD then placed by Dr. Sharrell KuGreg Taylor.  He said no discharges. 4. Essential hypertension-history of essential hypertension with recent blood pressure measured at 140/70 on carvedilol and lisinopril 5. Hyperlipidemia- history of hyperlipidemia on Crestor and fenofibrate with lipid profile performed by his PCP. 6. Carotid artery disease- history of remote left internal carotid stenting by myself under the "Sapphire protocol" with carotid Dopplers performed 03/28/2018 which were normal 7. Renal artery stenosis- history of right renal artery stenosis with Dopplers performed 03/28/2018 revealing a renal aortic ratio 4.3 which has remained stable as have his renal dimensions.  COVID-19 Education: The signs and symptoms of COVID-19 were discussed with the patient and how to seek care for testing (follow up with PCP or arrange E-visit).  The importance of social distancing was discussed today.  Time:   Today, I have spent 7 minutes with the patient with telehealth  technology discussing the above problems.     Medication Adjustments/Labs and Tests Ordered: Current medicines are reviewed at length with the patient today.  Concerns regarding medicines are outlined above.   Tests Ordered: No orders of the defined types were placed in this encounter.   Medication Changes: No orders of the defined types were placed in this encounter.   Disposition:  Follow up in 1 year(s)  Signed, Nanetta BattyJonathan Sheffield Hawker, MD  03/18/2019 8:38 AM    [facility]

## 2019-03-18 NOTE — Patient Instructions (Signed)
Medication Instructions:  Your physician recommends that you continue on your current medications as directed. Please refer to the Current Medication list given to you today.  If you need a refill on your cardiac medications before your next appointment, please call your pharmacy.   Lab work: NONE If you have labs (blood work) drawn today and your tests are completely normal, you will receive your results only by: [name] MyChart Message (if you have MyChart) OR . A paper copy in the mail If you have any lab test that is abnormal or we need to change your treatment, we will call you to review the results.  Testing/Procedures: Your physician has requested that you have an echocardiogram. Echocardiography is a painless test that uses sound waves to create images of your heart. It provides your doctor with information about the size and shape of your heart and how well your heart's chambers and valves are working. This procedure takes approximately one hour. There are no restrictions for this procedure.LOCATION: [facility], [place] [address] TO BE SCHEDULED FOR 1-2 MONTHS FROM TODAY. YOU WILL BE CONTACTED BY A SCHEDULER TO SET UP THIS APPOINTMENT.    Follow-Up: At [facility], you and your health needs are our priority.  As part of our continuing mission to provide you with exceptional heart care, we have created designated Provider Care Teams.  These Care Teams include your primary Cardiologist (physician) and Advanced Practice Providers (APPs -  Physician Assistants and Nurse Practitioners) who all work together to provide you with the care you need, when you need it. You will need a follow up appointment in 12 months WITH DR. Gwenlyn Found.  Please call our office 2 months in advance to schedule this appointment.      Any Other Special Instructions Will Be Listed Below (If Applicable). REFERRAL TO DR. Ramah (IMPLANTABLE CARDIOVERTER DEFIBRILLATOR) APPOINTMENT. YOU WILL  BE CONTACTED BY A SCHEDULER TO SET UP AN APPOINTMENT.

## 2019-03-18 NOTE — Telephone Encounter (Signed)
DPR ON FILE. Left detailed message on pt VM regarding 6/10 avs instructions  AVS released to [facility] ([facility])

## 2019-03-20 ENCOUNTER — Ambulatory Visit: Payer: Self-pay | Admitting: Family Medicine

## 2019-03-24 ENCOUNTER — Other Ambulatory Visit: Payer: Self-pay | Admitting: Cardiovascular Disease

## 2019-04-02 ENCOUNTER — Other Ambulatory Visit: Payer: Self-pay

## 2019-04-02 ENCOUNTER — Other Ambulatory Visit (HOSPITAL_COMMUNITY): Payer: Self-pay | Admitting: Cardiovascular Disease

## 2019-04-02 ENCOUNTER — Other Ambulatory Visit: Payer: Self-pay | Admitting: Cardiovascular Disease

## 2019-04-02 ENCOUNTER — Ambulatory Visit (HOSPITAL_BASED_OUTPATIENT_CLINIC_OR_DEPARTMENT_OTHER)
Admission: RE | Admit: 2019-04-02 | Discharge: 2019-04-02 | Disposition: A | Discharge status: Home / Self Care | Payer: Medicare HMO | Source: Ambulatory Visit | Attending: Cardiovascular Disease | Admitting: Cardiovascular Disease

## 2019-04-02 ENCOUNTER — Ambulatory Visit (HOSPITAL_COMMUNITY)
Admission: RE | Admit: 2019-04-02 | Discharge: 2019-04-02 | Disposition: A | Discharge status: Home / Self Care | Payer: Medicare HMO | Source: Ambulatory Visit | Attending: Internal Medicine | Admitting: Internal Medicine

## 2019-04-02 DIAGNOSIS — I739 Peripheral vascular disease, unspecified: Secondary | ICD-10-CM | POA: Insufficient documentation

## 2019-04-02 DIAGNOSIS — I6523 Occlusion and stenosis of bilateral carotid arteries: Secondary | ICD-10-CM

## 2019-04-02 DIAGNOSIS — I701 Atherosclerosis of renal artery: Secondary | ICD-10-CM

## 2019-04-13 ENCOUNTER — Other Ambulatory Visit (HOSPITAL_COMMUNITY): Payer: Medicare HMO

## 2019-04-15 ENCOUNTER — Other Ambulatory Visit: Payer: Self-pay

## 2019-04-15 ENCOUNTER — Ambulatory Visit (HOSPITAL_COMMUNITY): Payer: Medicare HMO | Attending: Cardiovascular Disease

## 2019-04-15 DIAGNOSIS — E785 Hyperlipidemia, unspecified: Secondary | ICD-10-CM | POA: Diagnosis not present

## 2019-04-15 DIAGNOSIS — I10 Essential (primary) hypertension: Secondary | ICD-10-CM | POA: Insufficient documentation

## 2019-04-15 DIAGNOSIS — I519 Heart disease, unspecified: Secondary | ICD-10-CM

## 2019-04-15 DIAGNOSIS — I447 Left bundle-branch block, unspecified: Secondary | ICD-10-CM | POA: Diagnosis not present

## 2019-04-15 DIAGNOSIS — I255 Ischemic cardiomyopathy: Secondary | ICD-10-CM | POA: Diagnosis not present

## 2019-04-15 DIAGNOSIS — I2510 Atherosclerotic heart disease of native coronary artery without angina pectoris: Secondary | ICD-10-CM | POA: Diagnosis not present

## 2019-04-16 ENCOUNTER — Encounter: Payer: Self-pay | Admitting: Internal Medicine

## 2019-04-16 ENCOUNTER — Ambulatory Visit (INDEPENDENT_AMBULATORY_CARE_PROVIDER_SITE_OTHER): Payer: Medicare HMO | Admitting: Internal Medicine

## 2019-04-16 VITALS — BP 127/57 | HR 65 | Temp 98.7°F | Ht 70.0 in | Wt 161.0 lb

## 2019-04-16 DIAGNOSIS — Z95810 Presence of automatic (implantable) cardiac defibrillator: Secondary | ICD-10-CM | POA: Diagnosis not present

## 2019-04-16 DIAGNOSIS — I255 Ischemic cardiomyopathy: Secondary | ICD-10-CM

## 2019-04-16 LAB — CUP PACEART INCLINIC DEVICE CHECK
Date Time Interrogation Session: 20200709180949
Implantable Lead Implant Date: 20160616
Implantable Lead Location: 753860
Implantable Lead Model: 3401
Implantable Pulse Generator Implant Date: 20160616
Pulse Gen Serial Number: 116179

## 2019-04-16 NOTE — Progress Notes (Signed)
HPI Mr. Carolin CoyRoach returns today for ongoing evaluation and management of chronic systolic heart failure, and ischemic cardiomyopathy, left bundle branch block, status post ICD insertion.  The patient has never received an ICD therapy.  He initially had a biventricular device placed, complicated 2 years after initial implant by skin erosion and subsequent requirement for system extraction.  The patient underwent reinsertion of a subcutaneous device.  Approximately 2 years ago and 2 years after initial implantation, the patient was noted to have thinning of the skin around his ICD site.  This has not worsened but it is not improved.  He does not have fevers or chills and his device is not tender.  The patient has class II heart failure symptoms and denies chest pain. No Known Allergies   Current Outpatient Medications  Medication Sig Dispense Refill  . aspirin 81 MG tablet Take 81 mg by mouth daily.    . carvedilol (COREG) 25 MG tablet Take 12.5 tablets by mouth 2 (two) times daily.    . carvedilol (COREG) 25 MG tablet TAKE 1 TABLET BY MOUTH TWICE DAILY WITH MEALS 180 tablet 3  . clopidogrel (PLAVIX) 75 MG tablet Take 1 tablet by mouth once daily 90 tablet 0  . fenofibrate (TRICOR) 145 MG tablet Take 1 tablet by mouth once daily 90 tablet 3  . lisinopril (ZESTRIL) 10 MG tablet Take 1 tablet by mouth once daily 90 tablet 3  . LORazepam (ATIVAN) 0.5 MG tablet Take 0.5 mg by mouth 3 (three) times daily.     [name]. omega-3 acid ethyl esters (LOVAZA) 1 G capsule Take 1 g by mouth daily.    [name]. omeprazole (PRILOSEC) 40 MG capsule Take 40 mg by mouth daily.     . rosuvastatin (CRESTOR) 40 MG tablet Take 40 mg by mouth daily.     No current facility-administered medications for this visit.      Past Medical History:  Diagnosis Date  . Anxiety   . CAD (coronary artery disease) 9/13   s/p CABG  . Cardiac arrest due to underlying cardiac condition (HCC)    successfully rescucitated/ cooled  . Cardiac  defibrillator in place   . Cerebrovascular disease 10/15/2012  . Chronic systolic dysfunction of left ventricle   . GERD (gastroesophageal reflux disease)   . Headache(784.0)   . Hyperlipidemia   . Hypertension   . Infection of pacemaker pocket (H[facility])    of ICD with subsequent explant  . Ischemic cardiomyopathy    s/p BiV ICD implant Feb 2014 - [address] [name] [place], [place] 3086VH-842088TC-52 (serial # [id number]) right atrial lead and a [name] [place], model 6962-95M7122-65Q (serial number [id number]) right ventricular defibrillator lead. [name] Quartet model [id number] - 86 (serial number [id number]) LV lead. [name] Quadra [place] model [place] [id number] [phone] (serial Number [id number]) biventricular ICD.   [name]. Left bundle branch block   . Peripheral arterial disease (HCC)   . PONV (postoperative nausea and vomiting)   . Renal artery stenosis (HCC)   . Shortness of breath dyspnea    with exertion  . Spinal headache     ROS:   All systems reviewed and negative except as noted in the HPI.   Past Surgical History:  Procedure Laterality Date  . BI-VENTRICULAR IMPLANTABLE CARDIOVERTER DEFIBRILLATOR [name] 11/25/2012   Procedure: BI-VENTRICULAR IMPLANTABLE CARDIOVERTER DEFIBRILLATOR  (CRT-D);  Surgeon: [name], MD;  [facility];  Service: Cardiovascular;  Laterality: [name];  . CAROTID  ANGIOGRAM Bilateral 10/14/2012   Procedure: CAROTID ANGIOGRAM;  Surgeon: [name], MD;  [facility];  Service: Cardiovascular;  Laterality: Bilateral;  . CAROTID STENT INSERTION [name] 10/14/2012   Procedure: CAROTID STENT INSERTION;  Surgeon: [name], MD;  [facility];  Service: Cardiovascular;  Laterality: [name];  . COLONOSCOPY [name] 08/18/2015   Procedure: COLONOSCOPY;  Surgeon: Malissa HippoNajeeb U Rehman, MD;  [facility];  Service: Endoscopy;  Laterality: [name];  830  . CORONARY ARTERY BYPASS GRAFT  06/11/2012   Procedure: CORONARY ARTERY BYPASS GRAFTING (CABG);  Surgeon: Kerin PernaPeter Van  Trigt, MD;  [facility];  Service: Open Heart Surgery;  Laterality: [name];  Coronary Artery Bypass Grafting times four using left internal mammary artery and right greater saphenous vein endoscopically harvested.  . EP IMPLANTABLE DEVICE [name] 03/24/2015   Procedure: SubQ ICD Implant;  Surgeon: Marinus MawGregg W , MD;  [facility];  Service: Cardiovascular;  Laterality: [name];  . 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 LEFT HEART CATHETERIZATION WITH CORONARY ANGIOGRAM [name] 06/11/2012   Procedure: LEFT HEART CATHETERIZATION WITH CORONARY ANGIOGRAM;  Surgeon: [name], MD;  [facility];  Service: Cardiovascular;  Laterality: [name];  . PERCUTANEOUS PLACEMENT INTRAVASCULAR STENT CERVICAL CAROTID ARTERY       Family History  Problem Relation Age of Onset  . Cancer Brother      Social History   Socioeconomic History  . Marital status: Married    Spouse name: Not on file  . Number of children: Not on file  . Years of education: Not on file  . Highest education level: Not on file  Occupational History  . Not on file  Social Needs  . Financial resource strain: Not on file  . Food insecurity    Worry: Not on file    Inability: Not on file  . Transportation needs    Medical: Not on file    Non-medical: Not on file  Tobacco Use  . Smoking status: Former Smoker    Packs/day: 1.50    Years: 40.00    Pack years: 60.00    Types: Cigarettes    Quit date: 06/11/2012    Years since quitting: 6.8  . Smokeless tobacco: Never Used  Substance and Sexual Activity  . Alcohol use: Yes    Alcohol/week: 14.0 standard drinks    Types: 14 Cans of beer per week  . Drug use: No  . Sexual activity: Yes  Lifestyle  . Physical activity    Days per week: Not on file    Minutes per session: Not on file  . Stress: Not  on file  Relationships  . Social Musicianconnections    Talks on phone: Not on file    Gets together: Not on file    Attends religious service: Not on file    Active member of club or organization: Not on file    Attends meetings of clubs or organizations: Not on file    Relationship status: Not on file  . Intimate partner violence    Fear of current or ex partner: Not on file    Emotionally abused: Not on file    Physically abused: Not on file    Forced sexual activity: Not on file  Other Topics Concern  .  Not on file  Social History Narrative  . Not on file     BP (!) 127/57 (BP Location: Left Arm)   Pulse 65   Temp 98.7 F (37.1 C)   Ht 5\' 10"  (1.778 m)   Wt 161 lb (73 kg)   SpO2 98%   BMI 23.10 kg/m   Physical Exam:  Well appearing 63 year old man, NAD HEENT: Unremarkable Neck:  No JVD, no thyromegally Lymphatics:  No adenopathy Back:  No CVA tenderness Lungs:  Clear, with no wheezes, rales, or rhonchi; the skin around the subcutaneous ICD is thin and but still intact HEART:  Regular rate rhythm, no murmurs, no rubs, no clicks Abd:  soft, positive bowel sounds, no organomegally, no rebound, no guarding Ext:  2 plus pulses, no edema, no cyanosis, no clubbing Skin:  No rashes no nodules Neuro:  CN II through XII intact, motor grossly intact  DEVICE  Normal device function.  See PaceArt for details.  Normal subcutaneous ICD function  Assess/Plan: 1.  Ischemic cardiomyopathy -the patient denies anginal symptoms.  He will continue his current medications 2.  Chronic systolic heart failure -his current symptoms are class II.  He is encouraged to maintain a low-sodium diet.  No change in medications. 3.  Subcutaneous ICD -his device is working normally.  He has thinning of the skin over his device but it remains intact.  I expect that ultimately the device will come through the skin and we will have to remove it.  At that point, we will have to make an additional decision  about whether another device should be placed or not.  Cristopher Peru, MD

## 2019-04-16 NOTE — Patient Instructions (Signed)
Medication Instructions: Your physician recommends that you continue on your current medications as directed. Please refer to the Current Medication list given to you today.   Labwork: none  Procedures/Testing: none  Follow-Up: Remote 05/19/2019  , Dr Lovena Le in 1 year  Any Additional Special Instructions Will Be Listed Below (If Applicable).     If you need a refill on your cardiac medications before your next appointment, please call your pharmacy.     Thank you for choosing [facility] !

## 2019-04-23 ENCOUNTER — Other Ambulatory Visit: Payer: Self-pay

## 2019-04-24 ENCOUNTER — Encounter: Payer: Self-pay | Admitting: Family Medicine

## 2019-04-24 ENCOUNTER — Ambulatory Visit (INDEPENDENT_AMBULATORY_CARE_PROVIDER_SITE_OTHER): Payer: Medicare HMO | Admitting: Family Medicine

## 2019-04-24 VITALS — BP 123/71 | HR 58 | Temp 98.6°F | Ht 70.0 in | Wt 158.2 lb

## 2019-04-24 DIAGNOSIS — I10 Essential (primary) hypertension: Secondary | ICD-10-CM

## 2019-04-24 DIAGNOSIS — I2510 Atherosclerotic heart disease of native coronary artery without angina pectoris: Secondary | ICD-10-CM

## 2019-04-24 DIAGNOSIS — R7309 Other abnormal glucose: Secondary | ICD-10-CM

## 2019-04-24 DIAGNOSIS — I739 Peripheral vascular disease, unspecified: Secondary | ICD-10-CM

## 2019-04-24 DIAGNOSIS — E782 Mixed hyperlipidemia: Secondary | ICD-10-CM

## 2019-04-24 DIAGNOSIS — Z95810 Presence of automatic (implantable) cardiac defibrillator: Secondary | ICD-10-CM

## 2019-04-24 DIAGNOSIS — R739 Hyperglycemia, unspecified: Secondary | ICD-10-CM

## 2019-04-24 DIAGNOSIS — I701 Atherosclerosis of renal artery: Secondary | ICD-10-CM

## 2019-04-24 DIAGNOSIS — I6522 Occlusion and stenosis of left carotid artery: Secondary | ICD-10-CM

## 2019-04-24 DIAGNOSIS — I2583 Coronary atherosclerosis due to lipid rich plaque: Secondary | ICD-10-CM

## 2019-04-24 DIAGNOSIS — F5101 Primary insomnia: Secondary | ICD-10-CM

## 2019-04-24 LAB — BAYER DCA HB A1C WAIVED: HB A1C (BAYER DCA - WAIVED): 5.3 % (ref <7.0)

## 2019-04-24 MED ORDER — TRAZODONE HCL 50 MG PO TABS: 25.0000 mg | ORAL_TABLET | Freq: Every evening | ORAL | 3 refills | Status: DC | PRN

## 2019-04-24 NOTE — Progress Notes (Signed)
BP 123/71   Pulse (!) 58   Temp 98.6 F (37 C) (Other (Comment))   Ht 5' 10"  (1.778 m)   Wt 158 lb 3.2 oz (71.8 kg)   BMI 22.70 kg/m    Subjective:    Patient ID: Jonathon Ryan, male    DOB: 11/10/1955, 63 y.o.   MRN: [id number]  HPI: Jonathon Ryan is a 63 y.o. male presenting on 04/24/2019 for New Patient (Initial Visit) (est care- [place] )   HPI Hypertension Patient is currently on carvedilol and lisinopril, and their blood pressure today is 123/71. Patient denies any lightheadedness or dizziness. Patient denies headaches, blurred vision, chest pains, shortness of breath, or weakness. Denies any side effects from medication and is content with current medication.   Hyperlipidemia Patient is coming in for recheck of his hyperlipidemia. The patient is currently taking fenofibrate and Crestor. They deny any issues with myalgias or history of liver damage from it. They deny any focal numbness or weakness or chest pain.   Heart disease and CAD and PAD CVD Patient has a diagnosis of heart disease and CAD and PAD and CBD and has a cardiologist and electrophysiologist and has an AICD, currently his AICD is more closer to his lower ribs because he had an infection in the previous 1 but he started to have problems with this 1 as well.  But he follows with cardiologist for this.  Patient use the Ativan for insomnia but discussed the potential for dementia and would like to try trazodone instead, he will just use it as needed  Relevant past medical, surgical, family and social history reviewed and updated as indicated. Interim medical history since our last visit reviewed. Allergies and medications reviewed and updated.  Review of Systems  Constitutional: Negative for chills and fever.  HENT: Negative for congestion, ear pain and tinnitus.   Eyes: Negative for visual disturbance.  Respiratory: Negative for cough, shortness of breath and wheezing.   Cardiovascular: Negative for chest pain,  palpitations and leg swelling.  Gastrointestinal: Negative for abdominal pain, blood in stool, constipation and diarrhea.  Genitourinary: Negative for dysuria and hematuria.  Musculoskeletal: Negative for back pain, gait problem and myalgias.  Skin: Negative for rash.  Neurological: Negative for dizziness, weakness and headaches.  Psychiatric/Behavioral: Negative for suicidal ideas.  All other systems reviewed and are negative.   Per HPI unless specifically indicated above  Social History   Socioeconomic History  . Marital status: Married    Spouse name: Not on file  . Number of children: Not on file  . Years of education: Not on file  . Highest education level: Not on file  Occupational History  . Not on file  Social Needs  . Financial resource strain: Not on file  . Food insecurity    Worry: Not on file    Inability: Not on file  . Transportation needs    Medical: Not on file    Non-medical: Not on file  Tobacco Use  . Smoking status: Former Smoker    Packs/day: 1.50    Years: 40.00    Pack years: 60.00    Types: Cigarettes    Quit date: 06/11/2012    Years since quitting: 6.8  . Smokeless tobacco: Never Used  Substance and Sexual Activity  . Alcohol use: Yes    Alcohol/week: 14.0 standard drinks    Types: 14 Cans of beer per week  . Drug use: No  . Sexual activity: Yes  Lifestyle  .  Physical activity    Days per week: Not on file    Minutes per session: Not on file  . Stress: Not on file  Relationships  . Social Herbalist on phone: Not on file    Gets together: Not on file    Attends religious service: Not on file    Active member of club or organization: Not on file    Attends meetings of clubs or organizations: Not on file    Relationship status: Not on file  . Intimate partner violence    Fear of current or ex partner: Not on file    Emotionally abused: Not on file    Physically abused: Not on file    Forced sexual activity: Not on file   Other Topics Concern  . Not on file  Social History Narrative  . Not on file    Past Surgical History:  Procedure Laterality Date  . BI-VENTRICULAR IMPLANTABLE CARDIOVERTER DEFIBRILLATOR [name] 11/25/2012   Procedure: BI-VENTRICULAR IMPLANTABLE CARDIOVERTER DEFIBRILLATOR  (CRT-D);  Surgeon: Thompson Grayer, MD;  Location: [facility] CATH LAB;  Service: Cardiovascular;  Laterality: [name];  . CAROTID ANGIOGRAM Bilateral 10/14/2012   Procedure: CAROTID ANGIOGRAM;  Surgeon: Serafina Mitchell, MD;  Location: [facility] CATH LAB;  Service: Cardiovascular;  Laterality: Bilateral;  . CAROTID STENT INSERTION [name] 10/14/2012   Procedure: CAROTID STENT INSERTION;  Surgeon: Serafina Mitchell, MD;  Location[facility] CATH LAB;  Service: Cardiovascular;  Laterality: [name];  . COLONOSCOPY [name] 08/18/2015   Procedure: COLONOSCOPY;  Surgeon: Rogene Houston, MD;  Location: [facility];  Service: Endoscopy;  Laterality: [name];  830  . CORONARY ARTERY BYPASS GRAFT  06/11/2012   Procedure: CORONARY ARTERY BYPASS GRAFTING (CABG);  Surgeon: [name], MD;  Location: [place];  Service: Open Heart Surgery;  Laterality: [name];  Coronary Artery Bypass Grafting times four using left internal mammary artery and right greater saphenous vein endoscopically harvested.  . EP IMPLANTABLE DEVICE [name] 03/24/2015   Procedure: SubQ ICD Implant;  Surgeon: Evans Lance, MD;  Location: [facility] CV LAB;  Service: Cardiovascular;  Laterality: [name];  . ICD LEAD REMOVAL Left 10/04/2014   Procedure: [place] ICD with lead;  Surgeon: Evans Lance, MD;  Location: [place];  Service: Cardiovascular;  Laterality: Left;  [name] is back up for [employer]  . LEFT HEART CATHETERIZATION WITH CORONARY ANGIOGRAM [name] 06/11/2012   Procedure: LEFT HEART CATHETERIZATION WITH CORONARY ANGIOGRAM;  Surgeon: Lorretta Harp, MD;  Location: [facility] CATH LAB;  Service: Cardiovascular;  Laterality: [name];  . PERCUTANEOUS PLACEMENT INTRAVASCULAR STENT CERVICAL CAROTID ARTERY      Family History   Problem Relation Age of Onset  . Cancer Brother     Allergies as of 04/24/2019   No Known Allergies     Medication List       Accurate as of April 24, 2019  9:48 AM. If you have any questions, ask your nurse or doctor.        aspirin 81 MG tablet Take 81 mg by mouth daily.   carvedilol 25 MG tablet Commonly known as: COREG Take 12.5 tablets by mouth 2 (two) times daily. What changed: Another medication with the same name was removed. Continue taking this medication, and follow the directions you see here. Changed by: Fransisca Kaufmann Dettinger, MD   clopidogrel 75 MG tablet Commonly known as: PLAVIX Take 1 tablet by mouth once daily   fenofibrate 145 MG tablet Commonly known as: TRICOR  Take 1 tablet by mouth once daily   lisinopril 10 MG tablet Commonly known as: ZESTRIL Take 1 tablet by mouth once daily   LORazepam 0.5 MG tablet Commonly known as: ATIVAN Take 0.5 mg by mouth 3 (three) times daily.   omega-3 acid ethyl esters 1 g capsule Commonly known as: LOVAZA Take 1 g by mouth daily.   omeprazole 40 MG capsule Commonly known as: PRILOSEC Take 40 mg by mouth daily.   rosuvastatin 40 MG tablet Commonly known as: CRESTOR Take 40 mg by mouth daily.          Objective:    BP 123/71   Pulse (!) 58   Temp 98.6 F (37 C) (Other (Comment))   Ht 5' 10"  (1.778 m)   Wt 158 lb 3.2 oz (71.8 kg)   BMI 22.70 kg/m   Wt Readings from Last 3 Encounters:  04/24/19 158 lb 3.2 oz (71.8 kg)  04/16/19 161 lb (73 kg)  03/18/19 156 lb (70.8 kg)    Physical Exam Vitals signs and nursing note reviewed.  Constitutional:      General: He is not in acute distress.    Appearance: He is well-developed. He is not diaphoretic.  Eyes:     General: No scleral icterus.    Conjunctiva/sclera: Conjunctivae normal.  Neck:     Musculoskeletal: Neck supple.     Thyroid: No thyromegaly.  Cardiovascular:     Rate and Rhythm: Normal rate and regular rhythm.     Heart sounds: Normal  heart sounds. No murmur.     Comments: Bruising around where his pacemaker site is in the left lower chest wall, he says this is how the other 1 started and it got infected. Pulmonary:     Effort: Pulmonary effort is normal. No respiratory distress.     Breath sounds: Normal breath sounds. No stridor. No wheezing or rhonchi.  Lymphadenopathy:     Cervical: No cervical adenopathy.  Skin:    General: Skin is warm and dry.     Findings: No rash.  Neurological:     Mental Status: He is alert and oriented to person, place, and time.     Coordination: Coordination normal.  Psychiatric:        Behavior: Behavior normal.         Assessment & Plan:   Problem List Items Addressed This Visit      Cardiovascular and Mediastinum   PVD, 90% Rt RAS, 80% Rt Iliac at cath   CAD, 90% LM, Total RCA, 80-90% CFX- S/P urgent CABG X 11 Jun 2012   Relevant Orders   CBC with Differential/Platelet (Completed)   Lipid panel (Completed)   Carotid artery stenosis, LICA stent 05/08/18   Relevant Orders   Lipid panel (Completed)   Hypertension - Primary   Relevant Orders   CMP14+EGFR (Completed)   Renal artery stenosis (HCC)     Other   HLD (hyperlipidemia)   Relevant Orders   Lipid panel (Completed)   Automatic implantable cardioverter-defibrillator in situ    Other Visit Diagnoses    Primary insomnia       Relevant Medications   traZODone (DESYREL) 50 MG tablet   Elevated random blood glucose level       Relevant Orders   Bayer DCA Hb A1c Waived (Completed)    No changes in current medication, he sees cardiology and Dr. Alvester Chou and electrophysiology and Dr. Lovena Le they manage most of his cardiac medications but we will help monitor  blood work and see back in 4 months  Follow up plan: Return in about 4 months (around 08/25/2019), or if symptoms worsen or fail to improve, for Recheck hypertension and cholesterol.  Caryl Pina, MD [facility] 04/24/2019, 9:48 AM

## 2019-04-25 ENCOUNTER — Other Ambulatory Visit: Payer: Self-pay | Admitting: Physician Assistant

## 2019-04-25 DIAGNOSIS — E875 Hyperkalemia: Secondary | ICD-10-CM

## 2019-04-25 LAB — CBC WITH DIFFERENTIAL/PLATELET
Basophils Absolute: 0 10*3/uL (ref 0.0–0.2)
Basos: 1 %
EOS (ABSOLUTE): 0.2 10*3/uL (ref 0.0–0.4)
Eos: 2 %
Hematocrit: 41.9 % (ref 37.5–51.0)
Hemoglobin: 14.3 g/dL (ref 13.0–17.7)
Immature Grans (Abs): 0 10*3/uL (ref 0.0–0.1)
Immature Granulocytes: 0 %
Lymphocytes Absolute: 1.7 10*3/uL (ref 0.7–3.1)
Lymphs: 27 %
MCH: 29.1 pg (ref 26.6–33.0)
MCHC: 34.1 g/dL (ref 31.5–35.7)
MCV: 85 fL (ref 79–97)
Monocytes Absolute: 0.6 10*3/uL (ref 0.1–0.9)
Monocytes: 9 %
Neutrophils Absolute: 3.7 10*3/uL (ref 1.4–7.0)
Neutrophils: 61 %
Platelets: 338 10*3/uL (ref 150–450)
RBC: 4.91 x10E6/uL (ref 4.14–5.80)
RDW: 12.5 % (ref 11.6–15.4)
WBC: 6.2 10*3/uL (ref 3.4–10.8)

## 2019-04-25 LAB — CMP14+EGFR
ALT: 27 IU/L (ref 0–44)
AST: 28 IU/L (ref 0–40)
Albumin/Globulin Ratio: 2.1 (ref 1.2–2.2)
Albumin: 5.2 g/dL — ABNORMAL HIGH (ref 3.8–4.8)
Alkaline Phosphatase: 64 IU/L (ref 39–117)
BUN/Creatinine Ratio: 16 (ref 10–24)
BUN: 18 mg/dL (ref 8–27)
Bilirubin Total: 0.5 mg/dL (ref 0.0–1.2)
CO2: 23 mmol/L (ref 20–29)
Calcium: 10.8 mg/dL — ABNORMAL HIGH (ref 8.6–10.2)
Chloride: 98 mmol/L (ref 96–106)
Creatinine, Ser: 1.12 mg/dL (ref 0.76–1.27)
GFR calc Af Amer: 80 mL/min/{1.73_m2} (ref >59)
GFR calc non Af Amer: 69 mL/min/{1.73_m2} (ref >59)
Globulin, Total: 2.5 g/dL (ref 1.5–4.5)
Glucose: 93 mg/dL (ref 65–99)
Potassium: 5.9 mmol/L (ref 3.5–5.2)
Sodium: 142 mmol/L (ref 134–144)
Total Protein: 7.7 g/dL (ref 6.0–8.5)

## 2019-04-25 LAB — LIPID PANEL
Chol/HDL Ratio: 4.5 ratio (ref 0.0–5.0)
Cholesterol, Total: 162 mg/dL (ref 100–199)
HDL: 36 mg/dL — ABNORMAL LOW (ref >39)
LDL Calculated: 82 mg/dL (ref 0–99)
Triglycerides: 222 mg/dL — ABNORMAL HIGH (ref 0–149)
VLDL Cholesterol Cal: 44 mg/dL — ABNORMAL HIGH (ref 5–40)

## 2019-04-25 NOTE — Progress Notes (Signed)
[facility] called with panic value.  Potassium 5.9 Spoke with patient and got him to stop the mango juice he had been drinking, avoid fruit and tomatoes until Monday. Come in for recheck ORDER has been placed

## 2019-04-27 ENCOUNTER — Other Ambulatory Visit: Payer: Self-pay

## 2019-04-27 ENCOUNTER — Other Ambulatory Visit: Payer: Medicare HMO

## 2019-04-27 DIAGNOSIS — E875 Hyperkalemia: Secondary | ICD-10-CM

## 2019-04-28 LAB — BMP8+EGFR
BUN/Creatinine Ratio: 14 (ref 10–24)
BUN: 16 mg/dL (ref 8–27)
CO2: 20 mmol/L (ref 20–29)
Calcium: 10.4 mg/dL — ABNORMAL HIGH (ref 8.6–10.2)
Chloride: 101 mmol/L (ref 96–106)
Creatinine, Ser: 1.15 mg/dL (ref 0.76–1.27)
GFR calc Af Amer: 77 mL/min/{1.73_m2} (ref >59)
GFR calc non Af Amer: 67 mL/min/{1.73_m2} (ref >59)
Glucose: 93 mg/dL (ref 65–99)
Potassium: 4.6 mmol/L (ref 3.5–5.2)
Sodium: 138 mmol/L (ref 134–144)

## 2019-05-13 ENCOUNTER — Other Ambulatory Visit: Payer: Self-pay | Admitting: *Deleted

## 2019-05-13 MED ORDER — OMEPRAZOLE 40 MG PO CPDR: 40.0000 mg | DELAYED_RELEASE_CAPSULE | Freq: Every day | ORAL | 0 refills | Status: DC

## 2019-05-19 ENCOUNTER — Encounter: Payer: Medicare HMO | Admitting: *Deleted

## 2019-05-27 ENCOUNTER — Encounter: Payer: Self-pay | Admitting: Cardiology

## 2019-06-01 ENCOUNTER — Other Ambulatory Visit: Payer: Self-pay | Admitting: Internal Medicine

## 2019-06-12 ENCOUNTER — Ambulatory Visit (INDEPENDENT_AMBULATORY_CARE_PROVIDER_SITE_OTHER): Payer: Medicare HMO | Admitting: *Deleted

## 2019-06-12 DIAGNOSIS — I255 Ischemic cardiomyopathy: Secondary | ICD-10-CM

## 2019-06-12 LAB — CUP PACEART REMOTE DEVICE CHECK
Battery Remaining Percentage: 54 %
Date Time Interrogation Session: 20200904115300
Implantable Lead Implant Date: 20160616
Implantable Lead Location: 753860
Implantable Lead Model: 3401
Implantable Pulse Generator Implant Date: 20160616
Pulse Gen Serial Number: 116179

## 2019-06-24 ENCOUNTER — Encounter: Payer: Self-pay | Admitting: Cardiology

## 2019-06-24 NOTE — Progress Notes (Signed)
Remote ICD transmission.   

## 2019-07-23 ENCOUNTER — Other Ambulatory Visit: Payer: Self-pay | Admitting: Internal Medicine

## 2019-08-18 ENCOUNTER — Other Ambulatory Visit: Payer: Self-pay | Admitting: *Deleted

## 2019-08-18 MED ORDER — ROSUVASTATIN CALCIUM 40 MG PO TABS: 40.0000 mg | ORAL_TABLET | Freq: Every day | ORAL | 3 refills | Status: DC

## 2019-08-25 ENCOUNTER — Other Ambulatory Visit: Payer: Self-pay

## 2019-08-26 ENCOUNTER — Encounter: Payer: Self-pay | Admitting: Family Medicine

## 2019-08-26 ENCOUNTER — Ambulatory Visit (INDEPENDENT_AMBULATORY_CARE_PROVIDER_SITE_OTHER): Payer: Medicare HMO | Admitting: Family Medicine

## 2019-08-26 VITALS — BP 135/71 | HR 54 | Temp 97.1°F | Ht 70.0 in | Wt 158.2 lb

## 2019-08-26 DIAGNOSIS — Z1159 Encounter for screening for other viral diseases: Secondary | ICD-10-CM | POA: Diagnosis not present

## 2019-08-26 DIAGNOSIS — I2583 Coronary atherosclerosis due to lipid rich plaque: Secondary | ICD-10-CM

## 2019-08-26 DIAGNOSIS — K219 Gastro-esophageal reflux disease without esophagitis: Secondary | ICD-10-CM

## 2019-08-26 DIAGNOSIS — I2510 Atherosclerotic heart disease of native coronary artery without angina pectoris: Secondary | ICD-10-CM

## 2019-08-26 DIAGNOSIS — I10 Essential (primary) hypertension: Secondary | ICD-10-CM

## 2019-08-26 DIAGNOSIS — E782 Mixed hyperlipidemia: Secondary | ICD-10-CM | POA: Diagnosis not present

## 2019-08-26 DIAGNOSIS — Z114 Encounter for screening for human immunodeficiency virus [HIV]: Secondary | ICD-10-CM

## 2019-08-26 DIAGNOSIS — K4090 Unilateral inguinal hernia, without obstruction or gangrene, not specified as recurrent: Secondary | ICD-10-CM

## 2019-08-26 MED ORDER — OMEPRAZOLE 40 MG PO CPDR: 40.0000 mg | DELAYED_RELEASE_CAPSULE | Freq: Every day | ORAL | 3 refills | Status: DC

## 2019-08-26 NOTE — Progress Notes (Addendum)
BP 135/71   Pulse (!) 54   Temp (!) 97.1 F (36.2 C) (Temporal)   Ht 5' 10"  (1.778 m)   Wt 158 lb 3.2 oz (71.8 kg)   SpO2 98%   BMI 22.70 kg/m    Subjective:   Patient ID: Jonathon Ryan, male    DOB: 1956-05-10, 63 y.o.   MRN: [id number]  HPI: Jonathon Ryan is a 63 y.o. male presenting on 08/26/2019 for Hypertension (4 month follow up) and Groin Swelling (Patient states it has been there since Aug)   HPI Hypertension Patient is currently on lisinopril and carvedilol, and their blood pressure today is 135/71. Patient denies any lightheadedness or dizziness. Patient denies headaches, blurred vision, chest pains, shortness of breath, or weakness. Denies any side effects from medication and is content with current medication.   Hyperlipidemia Patient is coming in for recheck of his hyperlipidemia. The patient is currently taking Crestor and fenofibrate. They deny any issues with myalgias or history of liver damage from it. They deny any focal numbness or weakness or chest pain.   GERD Patient is currently on omeprazole.  She denies any major symptoms or abdominal pain or belching or burping. She denies any blood in her stool or lightheadedness or dizziness.   Has CAD Patient has CAD and stents CABG in the past and is currently still seeing a cardiologist and has AICD.  Patient has a new bulge in his left groin that he noticed a few months ago.  He says it will come out and then go back in and sometimes comes out more especially when he is out golfing but he can definitely feel the soreness in there sometimes and sometimes he feels soreness down into his left testicle.  He had not noticed it before until over the past few months.  It is most the time not painful he denies any constipation or blood in his stool.  Relevant past medical, surgical, family and social history reviewed and updated as indicated. Interim medical history since our last visit reviewed. Allergies and medications  reviewed and updated.  Review of Systems  Constitutional: Negative for chills and fever.  Eyes: Negative for visual disturbance.  Respiratory: Negative for shortness of breath and wheezing.   Cardiovascular: Negative for chest pain and leg swelling.  Genitourinary: Positive for scrotal swelling.  Musculoskeletal: Negative for back pain and gait problem.  Skin: Negative for rash.  Neurological: Negative for dizziness, weakness and light-headedness.  All other systems reviewed and are negative.   Per HPI unless specifically indicated above   Allergies as of 08/26/2019   No Known Allergies     Medication List       Accurate as of August 26, 2019  8:22 AM. If you have any questions, ask your nurse or doctor.        aspirin 81 MG tablet Take 81 mg by mouth daily.   carvedilol 25 MG tablet Commonly known as: COREG Take 12.5 tablets by mouth 2 (two) times daily.   clopidogrel 75 MG tablet Commonly known as: PLAVIX Take 1 tablet by mouth once daily   fenofibrate 145 MG tablet Commonly known as: TRICOR Take 1 tablet by mouth once daily   lisinopril 10 MG tablet Commonly known as: ZESTRIL Take 1 tablet by mouth once daily   omeprazole 40 MG capsule Commonly known as: PRILOSEC Take 1 capsule (40 mg total) by mouth daily.   rosuvastatin 40 MG tablet Commonly known as: CRESTOR Take 1  tablet (40 mg total) by mouth daily.   traZODone 50 MG tablet Commonly known as: DESYREL Take 0.5-1 tablets (25-50 mg total) by mouth at bedtime as needed for sleep.        Objective:   BP 135/71   Pulse (!) 54   Temp (!) 97.1 F (36.2 C) (Temporal)   Ht 5' 10"  (1.778 m)   Wt 158 lb 3.2 oz (71.8 kg)   SpO2 98%   BMI 22.70 kg/m   Wt Readings from Last 3 Encounters:  08/26/19 158 lb 3.2 oz (71.8 kg)  04/24/19 158 lb 3.2 oz (71.8 kg)  04/16/19 161 lb (73 kg)    Physical Exam Vitals signs and nursing note reviewed.  Constitutional:      General: He is not in acute  distress.    Appearance: He is well-developed. He is not diaphoretic.  Eyes:     General: No scleral icterus.    Conjunctiva/sclera: Conjunctivae normal.  Neck:     Musculoskeletal: Neck supple.     Thyroid: No thyromegaly.  Cardiovascular:     Rate and Rhythm: Normal rate and regular rhythm.     Heart sounds: Normal heart sounds. No murmur.  Pulmonary:     Effort: Pulmonary effort is normal. No respiratory distress.     Breath sounds: Normal breath sounds. No wheezing.  Abdominal:     Hernia: A hernia is present. Hernia is present in the left inguinal area (Reducible and nontender).  Lymphadenopathy:     Cervical: No cervical adenopathy.  Skin:    General: Skin is warm and dry.     Findings: No rash.  Neurological:     Mental Status: He is alert and oriented to person, place, and time.     Coordination: Coordination normal.  Psychiatric:        Behavior: Behavior normal.       Assessment & Plan:   Problem List Items Addressed This Visit      Cardiovascular and Mediastinum   CAD, 90% LM, Total RCA, 80-90% CFX- S/P urgent CABG X 11 Jun 2012   Hypertension - Primary   Relevant Orders   CMP14+EGFR     Digestive   GERD (gastroesophageal reflux disease)   Relevant Medications   omeprazole (PRILOSEC) 40 MG capsule     Other   HLD (hyperlipidemia)   Relevant Orders   Lipid panel    Other Visit Diagnoses    Need for hepatitis C screening test       Relevant Orders   Hepatitis C antibody   Screening for HIV without presence of risk factors       Relevant Orders   HIV antibody   Left inguinal hernia       Relevant Orders   Ambulatory referral to General Surgery      No change in current medication, seems to be doing well. Follow up plan: Return in about 4 months (around 12/24/2019), or if symptoms worsen or fail to improve, for Hyperlipidemia hypertension.  Counseling provided for all of the vaccine components Orders Placed This Encounter  Procedures  .  Hepatitis C antibody  . HIV antibody  . CMP14+EGFR  . Lipid panel    Caryl Pina, MD [facility] 08/26/2019, 8:22 AM

## 2019-08-26 NOTE — Addendum Note (Signed)
Addended by: Caryl Pina on: 08/26/2019 08:32 AM   Modules accepted: Orders

## 2019-08-27 ENCOUNTER — Ambulatory Visit (INDEPENDENT_AMBULATORY_CARE_PROVIDER_SITE_OTHER): Payer: Medicare HMO | Admitting: *Deleted

## 2019-08-27 DIAGNOSIS — Z0000 Encounter for general adult medical examination without abnormal findings: Secondary | ICD-10-CM | POA: Diagnosis not present

## 2019-08-27 LAB — CMP14+EGFR
ALT: 19 IU/L (ref 0–44)
AST: 25 IU/L (ref 0–40)
Albumin/Globulin Ratio: 1.9 (ref 1.2–2.2)
Albumin: 5.2 g/dL — ABNORMAL HIGH (ref 3.8–4.8)
Alkaline Phosphatase: 64 IU/L (ref 39–117)
BUN/Creatinine Ratio: 13 (ref 10–24)
BUN: 14 mg/dL (ref 8–27)
Bilirubin Total: 0.5 mg/dL (ref 0.0–1.2)
CO2: 20 mmol/L (ref 20–29)
Calcium: 10.8 mg/dL — ABNORMAL HIGH (ref 8.6–10.2)
Chloride: 102 mmol/L (ref 96–106)
Creatinine, Ser: 1.1 mg/dL (ref 0.76–1.27)
GFR calc Af Amer: 82 mL/min/{1.73_m2} (ref >59)
GFR calc non Af Amer: 71 mL/min/{1.73_m2} (ref >59)
Globulin, Total: 2.8 g/dL (ref 1.5–4.5)
Glucose: 85 mg/dL (ref 65–99)
Potassium: 5.4 mmol/L — ABNORMAL HIGH (ref 3.5–5.2)
Sodium: 144 mmol/L (ref 134–144)
Total Protein: 8 g/dL (ref 6.0–8.5)

## 2019-08-27 LAB — LIPID PANEL
Chol/HDL Ratio: 5.2 ratio — ABNORMAL HIGH (ref 0.0–5.0)
Cholesterol, Total: 172 mg/dL (ref 100–199)
HDL: 33 mg/dL — ABNORMAL LOW (ref >39)
LDL Chol Calc (NIH): 93 mg/dL (ref 0–99)
Triglycerides: 270 mg/dL — ABNORMAL HIGH (ref 0–149)
VLDL Cholesterol Cal: 46 mg/dL — ABNORMAL HIGH (ref 5–40)

## 2019-08-27 LAB — HEPATITIS C ANTIBODY: Hep C Virus Ab: <0.1 s/co ratio (ref 0.0–0.9)

## 2019-08-27 LAB — HIV ANTIBODY (ROUTINE TESTING W REFLEX): HIV Screen 4th Generation wRfx: NONREACTIVE

## 2019-08-27 NOTE — Patient Instructions (Signed)
Preventive Care 40-64 Years Old, Male Preventive care refers to lifestyle choices and visits with your health care provider that can promote health and wellness. This includes:  A yearly physical exam. This is also called an annual well check.  Regular dental and eye exams.  Immunizations.  Screening for certain conditions.  Healthy lifestyle choices, such as eating a healthy diet, getting regular exercise, not using drugs or products that contain nicotine and tobacco, and limiting alcohol use. What can I expect for my preventive care visit? Physical exam Your health care provider will check:  Height and weight. These may be used to calculate body mass index (BMI), which is a measurement that tells if you are at a healthy weight.  Heart rate and blood pressure.  Your skin for abnormal spots. Counseling Your health care provider may ask you questions about:  Alcohol, tobacco, and drug use.  Emotional well-being.  Home and relationship well-being.  Sexual activity.  Eating habits.  Work and work environment. What immunizations do I need?  Influenza (flu) vaccine  This is recommended every year. Tetanus, diphtheria, and pertussis (Tdap) vaccine  You may need a Td booster every 10 years. Varicella (chickenpox) vaccine  You may need this vaccine if you have not already been vaccinated. Zoster (shingles) vaccine  You may need this after age 60. Measles, mumps, and rubella (MMR) vaccine  You may need at least one dose of MMR if you were born in 1957 or later. You may also need a second dose. Pneumococcal conjugate (PCV13) vaccine  You may need this if you have certain conditions and were not previously vaccinated. Pneumococcal polysaccharide (PPSV23) vaccine  You may need one or two doses if you smoke cigarettes or if you have certain conditions. Meningococcal conjugate (MenACWY) vaccine  You may need this if you have certain conditions. Hepatitis A vaccine   You may need this if you have certain conditions or if you travel or work in places where you may be exposed to hepatitis A. Hepatitis B vaccine  You may need this if you have certain conditions or if you travel or work in places where you may be exposed to hepatitis B. Haemophilus influenzae type b (Hib) vaccine  You may need this if you have certain risk factors. Human papillomavirus (HPV) vaccine  If recommended by your health care provider, you may need three doses over 6 months. You may receive vaccines as individual doses or as more than one vaccine together in one shot (combination vaccines). Talk with your health care provider about the risks and benefits of combination vaccines. What tests do I need? Blood tests  Lipid and cholesterol levels. These may be checked every 5 years, or more frequently if you are over 50 years old.  Hepatitis C test.  Hepatitis B test. Screening  Lung cancer screening. You may have this screening every year starting at age 55 if you have a 30-pack-year history of smoking and currently smoke or have quit within the past 15 years.  Prostate cancer screening. Recommendations will vary depending on your family history and other risks.  Colorectal cancer screening. All adults should have this screening starting at age 50 and continuing until age 75. Your health care provider may recommend screening at age 45 if you are at increased risk. You will have tests every 1-10 years, depending on your results and the type of screening test.  Diabetes screening. This is done by checking your blood sugar (glucose) after you have not eaten   for a while (fasting). You may have this done every 1-3 years.  Sexually transmitted disease (STD) testing. Follow these instructions at home: Eating and drinking  Eat a diet that includes fresh fruits and vegetables, whole grains, lean protein, and low-fat dairy products.  Take vitamin and mineral supplements as recommended  by your health care provider.  Do not drink alcohol if your health care provider tells you not to drink.  If you drink alcohol: ? Limit how much you have to 0-2 drinks a day. ? Be aware of how much alcohol is in your drink. In the [place] one drink equals one 12 oz bottle of beer (355 mL), one 5 oz glass of wine (148 mL), or one 1 oz glass of hard liquor (44 mL). Lifestyle  Take daily care of your teeth and gums.  Stay active. Exercise for at least 30 minutes on 5 or more days each week.  Do not use any products that contain nicotine or tobacco, such as cigarettes, e-cigarettes, and chewing tobacco. If you need help quitting, ask your health care provider.  If you are sexually active, practice safe sex. Use a condom or other form of protection to prevent STIs (sexually transmitted infections).  Talk with your health care provider about taking a low-dose aspirin every day starting at age 33. What's next?  Go to your health care provider once a year for a well check visit.  Ask your health care provider how often you should have your eyes and teeth checked.  Stay up to date on all vaccines. This information is not intended to replace advice given to you by your health care provider. Make sure you discuss any questions you have with your health care provider. Document Released: 10/21/2015 Document Revised: 09/18/2018 Document Reviewed: 09/18/2018 Elsevier Patient Education  2020 [employer].

## 2019-08-27 NOTE — Progress Notes (Addendum)
MEDICARE ANNUAL WELLNESS VISIT  08/27/2019  Telephone Visit Disclaimer This Medicare AWV was conducted by telephone due to national recommendations for restrictions regarding the COVID-19 Pandemic (e.g. social distancing).  I verified, using two identifiers, that I am speaking with Jonathon Ryan or their authorized healthcare agent. I discussed the limitations, risks, security, and privacy concerns of performing an evaluation and management service by telephone and the potential availability of an in-person appointment in the future. The patient expressed understanding and agreed to proceed.   Subjective:  Jonathon Ryan is a 63 y.o. male patient of Dettinger, Elige RadonJoshua A, MD who had a Medicare Annual Wellness Visit today via telephone. Jonathon Ryan is Disabled and lives with their spouse and 2 grandsons. he has 2 children. he reports that he is socially active and does interact with friends/family regularly. he is moderately physically active and enjoys playing golf and spending time with his grand kids.  Patient Care Team: Dettinger, Elige RadonJoshua A, MD as PCP - General (Family Medicine) Marinus Mawaylor, Gregg W, MD as PCP - Electrophysiology (Cardiology) Donata ClayVan Trigt, Theron AristaPeter, MD as Attending Physician (Cardiothoracic Surgery) Runell GessBerry, Jonathan J, MD as Attending Physician (Cardiology)  Advanced Directives 08/27/2019 08/29/2015 08/18/2015 03/24/2015 10/05/2014 09/29/2014 11/25/2012  Does Patient Have a Medical Advance Directive? No No No No No No Patient does not have advance directive;Patient would like information  Would patient like information on creating a medical advance directive? No - Patient declined No - patient declined information No - patient declined information No - patient declined information No - patient declined information Yes English as a second language teacher- Educational materials given -  Pre-existing out of facility DNR order (yellow form or pink MOST form) - - - - - - No    Hospital Utilization Over the Past 12 Months: # of  hospitalizations or ER visits: 0 # of surgeries: 0  Review of Systems    Patient reports that his overall health is unchanged compared to last year.  History obtained from chart review  Patient Reported Readings (BP, Pulse, CBG, Weight, etc) none  Pain Assessment Pain : No/denies pain     Current Medications & Allergies (verified) Allergies as of 08/27/2019   No Known Allergies     Medication List       Accurate as of August 27, 2019 10:52 AM. If you have any questions, ask your nurse or doctor.        aspirin 81 MG tablet Take 81 mg by mouth daily.   carvedilol 25 MG tablet Commonly known as: COREG Take 12.5 tablets by mouth 2 (two) times daily.   clopidogrel 75 MG tablet Commonly known as: PLAVIX Take 1 tablet by mouth once daily   fenofibrate 145 MG tablet Commonly known as: TRICOR Take 1 tablet by mouth once daily   lisinopril 10 MG tablet Commonly known as: ZESTRIL Take 1 tablet by mouth once daily   omeprazole 40 MG capsule Commonly known as: PRILOSEC Take 1 capsule (40 mg total) by mouth daily.   rosuvastatin 40 MG tablet Commonly known as: CRESTOR Take 1 tablet (40 mg total) by mouth daily.   traZODone 50 MG tablet Commonly known as: DESYREL Take 0.5-1 tablets (25-50 mg total) by mouth at bedtime as needed for sleep.       History (reviewed): Past Medical History:  Diagnosis Date  . Anxiety   . CAD (coronary artery disease) 9/13   s/p CABG  . Cardiac arrest due to underlying cardiac condition [facility](HCC)    successfully rescucitated/  cooled  . Cardiac defibrillator in place   . Cerebrovascular disease 10/15/2012  . Chronic systolic dysfunction of left ventricle   . GERD (gastroesophageal reflux disease)   . Headache(784.0)   . Hyperlipidemia   . Hypertension   . 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 Ischemic cardiomyopathy    s/p BiV ICD implant Feb 2014 - [place], [place] 2088TC-52  (serial # [id number]) right atrial lead and a [place], model 7122-65Q (serial number [id number]) right ventricular defibrillator lead. [place] - 86 (serial number [id number]) LV lead. [place] 3365 [phone] (serial Number [id number]) biventricular ICD.   [name] Left bundle branch block   . Peripheral arterial disease ([place])   . PONV (postoperative nausea and vomiting)   . Renal artery stenosis ([place])   . Shortness of breath dyspnea    with exertion  . Spinal headache    Past Surgical History:  Procedure Laterality Date  . BI-VENTRICULAR IMPLANTABLE CARDIOVERTER DEFIBRILLATOR [name] 11/25/2012   Procedure: BI-VENTRICULAR IMPLANTABLE CARDIOVERTER DEFIBRILLATOR  (CRT-D);  Surgeon: Thompson Grayer, MD;  Location: [facility] CATH LAB;  Service: Cardiovascular;  Laterality: [name];  . CAROTID ANGIOGRAM Bilateral 10/14/2012   Procedure: CAROTID ANGIOGRAM;  Surgeon: Serafina Mitchell, MD;  Location[facility] CATH LAB;  Service: Cardiovascular;  Laterality: Bilateral;  . CAROTID STENT INSERTION [name] 10/14/2012   Procedure: CAROTID STENT INSERTION;  Surgeon: Serafina Mitchell, MD;  Location: [facility] CATH LAB;  Service: Cardiovascular;  Laterality: [name];  . COLONOSCOPY [name] 08/18/2015   Procedure: COLONOSCOPY;  Surgeon: Rogene Houston, MD;  Location: [facility]O [facility];  Service: Endoscopy;  Laterality: [name];  830  . CORONARY ARTERY BYPASS GRAFT  06/11/2012   Procedure: CORONARY ARTERY BYPASS GRAFTING (CABG);  Surgeon: Jonathon Poot, MD;  Location: [place];  Service: Open Heart Surgery;  Laterality: [name];  Coronary Artery Bypass Grafting times four using left internal mammary artery and right greater saphenous vein endoscopically harvested.  . EP IMPLANTABLE DEVICE [name] 03/24/2015   Procedure: SubQ ICD Implant;  Surgeon: Evans Lance, MD;  Location: [place] CV LAB;  Service: Cardiovascular;  Laterality: [name];  . ICD LEAD REMOVAL Left 10/04/2014   Procedure: [place] Jude ICD with  lead;  Surgeon: Evans Lance, MD;  Location: [place];  Service: Cardiovascular;  Laterality: Left;  [name] is back up for [employer]  . LEFT HEART CATHETERIZATION WITH CORONARY ANGIOGRAM [name] 06/11/2012   Procedure: LEFT HEART CATHETERIZATION WITH CORONARY ANGIOGRAM;  Surgeon: Lorretta Harp, MD;  Location: [facility] CATH LAB;  Service: Cardiovascular;  Laterality: [name];  . PERCUTANEOUS PLACEMENT INTRAVASCULAR STENT CERVICAL CAROTID ARTERY     Family History  Problem Relation Age of Onset  . Cancer Half-Brother        prostate  . Cancer Half-Brother        mouth   Social History   Socioeconomic History  . Marital status: Married    Spouse name: [name]  . Number of children: 2  . Years of education: 10  . Highest education level: GED or equivalent  Occupational History  . Occupation: disability  Social Needs  . Financial resource strain: Not hard at all  . Food insecurity    Worry: Never true    Inability: Never true  . Transportation needs    Medical: No    Non-medical: No  Tobacco Use  . Smoking status:  Former Smoker    Packs/day: 1.50    Years: 40.00    Pack years: 60.00    Types: Cigarettes    Quit date: 06/11/2012    Years since quitting: 7.2  . Smokeless tobacco: Never Used  Substance and Sexual Activity  . Alcohol use: Yes    Alcohol/week: 14.0 standard drinks    Types: 14 Cans of beer per week  . Drug use: Yes    Frequency: 2.0 times per week    Types: Marijuana    Comment: 2 per week  . Sexual activity: Yes    Comment: married and 2 step-daughters  Lifestyle  . Physical activity    Days per week: 5 days    Minutes per session: 30 min  . Stress: Not at all  Relationships  . Social connections    Talks on phone: More than three times a week    Gets together: More than three times a week    Attends religious service: Never    Active member of club or organization: No    Attends meetings of clubs or organizations: Never    Relationship status: Married  Other Topics  Concern  . Not on file  Social History Narrative  . Not on file    Activities of Daily Living In your present state of health, do you have any difficulty performing the following activities: 08/27/2019  Hearing? N  Vision? N  Comment wears glasses-last eye exam almost a year ago  Difficulty concentrating or making decisions? N  Walking or climbing stairs? N  Dressing or bathing? N  Doing errands, shopping? N  Preparing Food and eating ? N  Using the Toilet? N  In the past six months, have you accidently leaked urine? N  Do you have problems with loss of bowel control? N  Managing your Medications? N  Managing your Finances? N  Housekeeping or managing your Housekeeping? N  Some recent data might be hidden    Patient Education/ Literacy How often do you need to have someone help you when you read instructions, pamphlets, or other written materials from your doctor or pharmacy?: 1 - Never What is the last grade level you completed in school?: 9th grade-went back and got his GED  Exercise Current Exercise Habits: Home exercise routine, Type of exercise: walking, Time (Minutes): 30, Frequency (Times/Week): 5, Weekly Exercise (Minutes/Week): 150, Intensity: Mild, Exercise limited by: cardiac condition(s)  Diet Patient reports consuming 2 meals a day and 3 snack(s) a day Patient reports that his primary diet is: Regular Patient reports that she does have regular access to food.   Depression Screen PHQ 2/9 Scores 08/27/2019 08/26/2019 04/24/2019  PHQ - 2 Score 0 0 0     Fall Risk Fall Risk  08/27/2019 08/26/2019 04/24/2019  Falls in the past year? 0 0 0     Objective:  Jonathon Poot seemed alert and oriented and he participated appropriately during our telephone visit.  Blood Pressure Weight BMI  BP Readings from Last 3 Encounters:  08/26/19 135/71  04/24/19 123/71  04/16/19 (!) 127/57   Wt Readings from Last 3 Encounters:  08/26/19 158 lb 3.2 oz (71.8 kg)  04/24/19  158 lb 3.2 oz (71.8 kg)  04/16/19 161 lb (73 kg)   BMI Readings from Last 1 Encounters:  08/26/19 22.70 kg/m    *Unable to obtain current vital signs, weight, and BMI due to telephone visit type  Hearing/Vision  . Bernerd did not seem to have  difficulty with hearing/understanding during the telephone conversation . Reports that he has had a formal eye exam by an eye care professional within the past year . Reports that he has not had a formal hearing evaluation within the past year *Unable to fully assess hearing and vision during telephone visit type  Cognitive Function: 6CIT Screen 08/27/2019  What Year? 0 points  What month? 0 points  What time? 0 points  Count back from 20 0 points  Months in reverse 0 points  Repeat phrase 0 points  Total Score 0   (Normal:0-7, Significant for Dysfunction: >8)  Normal Cognitive Function Screening: Yes   Immunization & Health Maintenance Record Immunization History  Administered Date(s) Administered  . Influenza Split 06/18/2012, 07/08/2014, 07/17/2017  . Influenza, High Dose Seasonal PF 07/28/2018  . Influenza, Quadrivalent, Recombinant, Inj, Pf 07/22/2019  . Influenza,inj,Quad PF,6+ Mos 07/28/2018  . Pneumococcal Polysaccharide-23 10/09/2011, 06/17/2012  . Tdap 10/09/2011    Health Maintenance  Topic Date Due  . Hepatitis C Screening  06/12/1956  . HIV Screening  12/12/1969  . TETANUS/TDAP  10/08/2021  . COLONOSCOPY  08/17/2025  . INFLUENZA VACCINE  Completed       Assessment  This is a routine wellness examination for RAWLEY HARJU.  Health Maintenance: Due or Overdue Health Maintenance Due  Topic Date Due  . Hepatitis C Screening  [birth year]  . HIV Screening  12/12/1969    Jonathon Poot does not need a referral for Community Assistance: Care Management:   no Social Work:    no Prescription Assistance:  no Nutrition/Diabetes Education:  no   Plan:  Personalized Goals Goals Addressed            This Visit's  Progress   . DIET - INCREASE WATER INTAKE       Try to drink 6-8 glasses of water daily      Personalized Health Maintenance & Screening Recommendations  Shingles vaccine  Lung Cancer Screening Recommended: no (Low Dose CT Chest recommended if Age 70-80 years, 30 pack-year currently smoking OR have quit w/in past 15 years) Hepatitis C Screening recommended: yes-will offer at next visit with PCP HIV Screening recommended: no  Advanced Directives: Written information was not prepared per patient's request.  Referrals & Orders No orders of the defined types were placed in this encounter.   Follow-up Plan . Follow-up with Dettinger, Elige Radon, MD as planned . Consider Shingles vaccine at your next visit with your PCP   I have personally reviewed and noted the following in the patient's chart:   . Medical and social history . Use of alcohol, tobacco or illicit drugs  . Current medications and supplements . Functional ability and status . Nutritional status . Physical activity . Advanced directives . List of other physicians . Hospitalizations, surgeries, and ER visits in previous 12 months . Vitals . Screenings to include cognitive, depression, and falls . Referrals and appointments  In addition, I have reviewed and discussed with Jonathon Poot certain preventive protocols, quality metrics, and best practice recommendations. A written personalized care plan for preventive services as well as general preventive health recommendations is available and can be mailed to the patient at his request.      Hessie Diener, LPN  19/14/7829    I have reviewed and agree with the above AWV documentation.   Jannifer Rodney, FNP

## 2019-09-10 ENCOUNTER — Encounter: Payer: Self-pay | Admitting: General Surgery

## 2019-09-10 ENCOUNTER — Ambulatory Visit: Payer: Medicare HMO | Admitting: General Surgery

## 2019-09-10 ENCOUNTER — Other Ambulatory Visit: Payer: Self-pay

## 2019-09-10 VITALS — BP 166/80 | HR 73 | Temp 96.4°F | Resp 14 | Ht 70.0 in | Wt 158.6 lb

## 2019-09-10 DIAGNOSIS — K4090 Unilateral inguinal hernia, without obstruction or gangrene, not specified as recurrent: Secondary | ICD-10-CM

## 2019-09-10 NOTE — Patient Instructions (Signed)
See Cardiologist for risk stratification for surgery first of 2021. We will discuss his recommendations and I will discuss with anesthesia regarding the option for surgery at [facility].    Inguinal Hernia, Adult An inguinal hernia is when fat or your intestines push through a weak spot in a muscle where your leg meets your lower belly (groin). This causes a rounded lump (bulge). This kind of hernia could also be:  In your scrotum, if you are male.  In folds of skin around your vagina, if you are male. There are three types of inguinal hernias. These include:  Hernias that can be pushed back into the belly (are reducible). This type rarely causes pain.  Hernias that cannot be pushed back into the belly (are incarcerated).  Hernias that cannot be pushed back into the belly and lose their blood supply (are strangulated). This type needs emergency surgery. If you do not have symptoms, you may not need treatment. If you have symptoms or a large hernia, you may need surgery. Follow these instructions at home: Lifestyle  Do these things if told by your doctor so you do not have trouble pooping (constipation): ? Drink enough fluid to keep your pee (urine) pale yellow. ? Eat foods that have a lot of fiber. These include fresh fruits and vegetables, whole grains, and beans. ? Limit foods that are high in fat and processed sugars. These include foods that are fried or sweet. ? Take medicine for trouble pooping.  Avoid lifting heavy objects.  Avoid standing for long amounts of time.  Do not use any products that contain nicotine or tobacco. These include cigarettes and e-cigarettes. If you need help quitting, ask your doctor.  Stay at a healthy weight. General instructions  You may try to push your hernia in by very gently pressing on it when you are lying down. Do not try to force the bulge back in if it will not push in easily.  Watch your hernia for any changes in shape, size, or  color. Tell your doctor if you see any changes.  Take over-the-counter and prescription medicines only as told by your doctor.  Keep all follow-up visits as told by your doctor. This is important. Contact a doctor if:  You have a fever.  You have new symptoms.  Your symptoms get worse. Get help right away if:  The area where your leg meets your lower belly has: ? Pain that gets worse suddenly. ? A bulge that gets bigger suddenly, and it does not get smaller after that. ? A bulge that turns red or purple. ? A bulge that is painful when you touch it.  You are a man, and your scrotum: ? Suddenly feels painful. ? Suddenly changes in size.  You cannot push the hernia in by very gently pressing on it when you are lying down. Do not try to force the bulge back in if it will not push in easily.  You feel sick to your stomach (nauseous), and that feeling does not go away.  You throw up (vomit), and that keeps happening.  You have a fast heartbeat.  You cannot poop (have a bowel movement) or pass gas. These symptoms may be an emergency. Do not wait to see if the symptoms will go away. Get medical help right away. Call your local emergency services (911 in the [place] Summary  An inguinal hernia is when fat or your intestines push through a weak spot in a muscle where your leg  meets your lower belly (groin). This causes a rounded lump (bulge).  If you do not have symptoms, you may not need treatment. If you have symptoms or a large hernia, you may need surgery.  Avoid lifting heavy objects. Also avoid standing for long amounts of time.  Do not try to force the bulge back in if it will not push in easily. This information is not intended to replace advice given to you by your health care provider. Make sure you discuss any questions you have with your health care provider. Document Released: 10/25/2006 Document Revised: 10/26/2017 Document Reviewed: 06/26/2017 Elsevier Patient  Education  2020 Else[employer] Inc.   Open Hernia Repair, Adult  Open hernia repair is a surgical procedure to fix a hernia. A hernia occurs when an internal organ or tissue pushes out through a weak spot in the abdominal wall muscles. Hernias commonly occur in the groin and around the navel. Most hernias tend to get worse over time. Often, surgery is done to prevent the hernia from becoming bigger, uncomfortable, or an emergency. Emergency surgery may be needed if abdominal contents get stuck in the opening (incarcerated hernia) or the blood supply gets cut off (strangulated hernia). In an open repair, an incision is made in the abdomen to perform the surgery. Tell a health care provider about:  Any allergies you have.  All medicines you are taking, including vitamins, herbs, eye drops, creams, and over-the-counter medicines.  Any problems you or family members have had with anesthetic medicines.  Any blood or bone disorders you have.  Any surgeries you have had.  Any medical conditions you have, including any recent cold or flu symptoms.  Whether you are pregnant or may be pregnant. What are the risks? Generally, this is a safe procedure. However, problems may occur, including:  Long-lasting (chronic) pain.  Bleeding.  Infection.  Damage to the testicle. This can cause shrinking or swelling.  Damage to the bladder, blood vessels, intestine, or nerves near the hernia.  Trouble passing urine.  Allergic reactions to medicines.  Return of the hernia. Medicines  Ask your health care provider about: ? Changing or stopping your regular medicines. This is especially important if you are taking diabetes medicines or blood thinners. ? Taking medicines such as aspirin and ibuprofen. These medicines can thin your blood. Do not take these medicines before your procedure if your health care provider instructs you not to.  You may be given antibiotic medicine to help prevent infection.  General instructions  You may have blood tests or imaging studies.  Ask your health care provider how your surgical site will be marked or identified.  If you smoke, do not smoke for at least 2 weeks before your procedure or for as long as told by your health care provider.  Let your health care provider know if you develop a cold or any infection before your surgery.  Plan to have someone take you home from the hospital or clinic.  If you will be going home right after the procedure, plan to have someone with you for 24 hours. What happens during the procedure?  To reduce your risk of infection: ? Your health care team will wash or sanitize their hands. ? Your skin will be washed with soap. ? Hair may be removed from the surgical area.  An IV tube will be inserted into one of your veins.  You will be given one or more of the following: ? A medicine to help you relax (  sedative). ? A medicine to numb the area (local anesthetic). ? A medicine to make you fall asleep (general anesthetic).  Your surgeon will make an incision over the hernia.  The tissues of the hernia will be moved back into place.  The edges of the hernia may be stitched together.  The opening in the abdominal muscles will be closed with stitches (sutures). Or, your surgeon will place a mesh patch made of manmade (synthetic) material over the opening.  The incision will be closed.  A bandage (dressing) may be placed over the incision. The procedure may vary among health care providers and hospitals. What happens after the procedure?  Your blood pressure, heart rate, breathing rate, and blood oxygen level will be monitored until the medicines you were given have worn off.  You may be given medicine for pain.  Do not drive for 24 hours if you received a sedative. This information is not intended to replace advice given to you by your health care provider. Make sure you discuss any questions you have with  your health care provider. Document Released: 03/20/2001 Document Revised: 09/06/2017 Document Reviewed: 03/07/2016 Elsevier Patient Education  2020 [employer].

## 2019-09-10 NOTE — Progress Notes (Signed)
[facility] History and Physical  Reason for Referral: Left inguinal hernia  Referring Physician:  Dettinger, Fransisca Kaufmann, MD   Chief Complaint    New Patient (Initial Visit)      Jonathon Ryan is a 62 y.o. male.  HPI: Jonathon Ryan is a very pleasant 63 yo with a history of CAD, prior MI with cardiac arrest that was witnessed, ROSC, subsequent CABG in 2013, ischemic cardiomyopathy and heart failure (EF 30-35%) with defibrillator in place that has never fired.  He is seen by his cardiologist Dr. Gwenlyn Found and his electrophysiologist Dr. Lovena Le regularly. He reports that since about August 2020 he has noticed a left groin bulge that has been getting larger and causing him more discomfort. He denies that it has ever been stuck out and hard, and always has been able to be reduced.  It is alleviated by laying or sitting down. It is aggravated by activity. He reports no issues with nausea/vomiting or constipation associated with the hernia. He has never felt a bulge on the right. He is active and golfs regularly.  He denies any chest pain, shortness of breath or leg swelling. He has been on a stable regimen of cardiac medication.   He is worried about the hernia getting worse, and is worried about strangulation.  He says that the discomfort is bearable, but that when he has been up on his feet all day or active that it bothers him. He is worried that it will change his activity level and prevent him from play golf.   Past Medical History:  Diagnosis Date  . Anxiety   . CAD (coronary artery disease) 9/13   s/p CABG  . Cardiac arrest due to underlying cardiac condition ([place])    successfully rescucitated/ cooled  . Cardiac defibrillator in place   . Cerebrovascular disease 10/15/2012  . Chronic systolic dysfunction of left ventricle   . GERD (gastroesophageal reflux disease)   . Headache(784.0)   . Hyperlipidemia   . Hypertension   . Infection of pacemaker pocket ([place])    of ICD with  subsequent explant  . Ischemic cardiomyopathy    s/p BiV ICD implant Feb 2014 - [place], [place] 2088TC-52 (serial # [id number]) right atrial lead and a [place], model 7122-65Q (serial number [id number]) right ventricular defibrillator lead. [place] - 86 (serial number [id number]) LV lead. [place] 3365 [phone] (serial Number [id number]) biventricular ICD.   [name] Left bundle branch block   . Peripheral arterial disease ([place])   . PONV (postoperative nausea and vomiting)   . Renal artery stenosis ([place])   . Shortness of breath dyspnea    with exertion  . Spinal headache     Past Surgical History:  Procedure Laterality Date  . BI-VENTRICULAR IMPLANTABLE CARDIOVERTER DEFIBRILLATOR [name] 11/25/2012   Procedure: BI-VENTRICULAR IMPLANTABLE CARDIOVERTER DEFIBRILLATOR  (CRT-D);  Surgeon: Thompson Grayer, MD;  Location: [facility] CATH LAB;  Service: Cardiovascular;  Laterality: [name];  . CAROTID ANGIOGRAM Bilateral 10/14/2012   Procedure: CAROTID ANGIOGRAM;  Surgeon: Serafina Mitchell, MD;  Location: [facility] CATH LAB;  Service: Cardiovascular;  Laterality: Bilateral;  . CAROTID STENT INSERTION [name] 10/14/2012   Procedure: CAROTID STENT INSERTION;  Surgeon: Serafina Mitchell, MD;  Location: [facility] CATH LAB;  Service: Cardiovascular;  Laterality: [name];  . COLONOSCOPY [name] 08/18/2015   Procedure: COLONOSCOPY;  Surgeon: Rogene Houston, MD;  Location: [facility] ENDO SUITE;  Service: Endoscopy;  Laterality: [name];  830  . CORONARY ARTERY BYPASS GRAFT  06/11/2012   Procedure: CORONARY ARTERY BYPASS GRAFTING (CABG);  Surgeon: [name], MD;  Location: [facility] OR;  Service: Open Heart Surgery;  Laterality: [name];  Coronary Artery Bypass Grafting times four using left internal mammary artery and right greater saphenous vein endoscopically harvested.  . EP IMPLANTABLE DEVICE [name] 03/24/2015   Procedure: SubQ ICD Implant;  Surgeon: Marinus Maw, MD;  Location: [facility] INVASIVE CV LAB;   Service: Cardiovascular;  Laterality: [name];  . ICD LEAD REMOVAL Left 10/04/2014   Procedure: REMOVAL Saint Jude ICD with lead;  Surgeon: Marinus Maw, MD;  Location: [facility] OR;  Service: Cardiovascular;  Laterality: Left;  [name] is back up for [employer]  . LEFT HEART CATHETERIZATION WITH CORONARY ANGIOGRAM [name] 06/11/2012   Procedure: LEFT HEART CATHETERIZATION WITH CORONARY ANGIOGRAM;  Surgeon: Runell Gess, MD;  Location: [facility] CATH LAB;  Service: Cardiovascular;  Laterality: [name];  . PERCUTANEOUS PLACEMENT INTRAVASCULAR STENT CERVICAL CAROTID ARTERY      Family History  Problem Relation Age of Onset  . Cancer Half-Brother        prostate  . Cancer Half-Brother        mouth    Social History   Tobacco Use  . Smoking status: Former Smoker    Packs/day: 1.50    Years: 40.00    Pack years: 60.00    Types: Cigarettes    Quit date: 06/11/2012    Years since quitting: 7.2  . Smokeless tobacco: Never Used  Substance Use Topics  . Alcohol use: Yes    Alcohol/week: 14.0 standard drinks    Types: 14 Cans of beer per week  . Drug use: Yes    Frequency: 2.0 times per week    Types: Marijuana    Comment: 2 per week    Medications: I have reviewed the patient's current medications. Allergies as of 09/10/2019   No Known Allergies     Medication List       Accurate as of September 10, 2019  3:02 PM. If you have any questions, ask your nurse or doctor.        aspirin 81 MG tablet Take 81 mg by mouth daily.   carvedilol 25 MG tablet Commonly known as: COREG Take 12.5 tablets by mouth 2 (two) times daily.   clopidogrel 75 MG tablet Commonly known as: PLAVIX Take 1 tablet by mouth once daily   fenofibrate 145 MG tablet Commonly known as: TRICOR Take 1 tablet by mouth once daily   lisinopril 10 MG tablet Commonly known as: ZESTRIL Take 1 tablet by mouth once daily   omeprazole 40 MG capsule Commonly known as: PRILOSEC Take 1 capsule (40 mg total) by mouth daily.   rosuvastatin  40 MG tablet Commonly known as: CRESTOR Take 1 tablet (40 mg total) by mouth daily.   traZODone 50 MG tablet Commonly known as: DESYREL Take 0.5-1 tablets (25-50 mg total) by mouth at bedtime as needed for sleep.        ROS:  A comprehensive review of systems was negative except for: Gastrointestinal: positive for reflux symptoms and left groin bulge  Blood pressure (!) 166/80, pulse 73, temperature (!) 96.4 F (35.8 C), temperature source Temporal, resp. rate 14, height  (1.778 m), weight 158 lb 9.6 oz (71.9 kg), SpO2 97 %. Physical Exam Vitals signs reviewed.  Constitutional:      Appearance: Normal appearance.  HENT:     Head:  Normocephalic and atraumatic.     Nose: Nose normal.     Mouth/Throat:     Mouth: Mucous membranes are moist.  Eyes:     Extraocular Movements: Extraocular movements intact.     Pupils: Pupils are equal, round, and reactive to light.  Neck:     Musculoskeletal: Normal range of motion.  Cardiovascular:     Rate and Rhythm: Normal rate and regular rhythm.  Abdominal:     General: There is no distension.     Palpations: Abdomen is soft.     Tenderness: There is no abdominal tenderness.     Hernia: A hernia is present. Hernia is present in the left inguinal area. There is no hernia in the right inguinal area.     Comments: Reducible left inguinal hernia  Musculoskeletal: Normal range of motion.        General: No swelling.  Skin:    General: Skin is warm and dry.  Neurological:     General: No focal deficit present.     Mental Status: He is alert and oriented to person, place, and time.  Psychiatric:        Mood and Affect: Mood normal.        Behavior: Behavior normal.        Thought Content: Thought content normal.        Judgment: Judgment normal.        Results: ECHO 04/2019  IMPRESSIONS   1. The left ventricle has moderate-severely reduced systolic function, with an ejection fraction of 30-35%. The cavity size was normal.  Left ventricular diastolic Doppler parameters are consistent with pseudonormalization. Elevated mean left atrial pressure There is abnormal septal motion consistent with left bundle branch block. Left ventricular diffuse hypokinesis.  2. The right ventricle has mildly reduced systolic function. The cavity was normal. There is no increase in right ventricular wall thickness. Right ventricular systolic pressure is mildly elevated with an estimated pressure of 38.6 mmHg.  3. Left atrial size was moderately dilated.  Assessment & Plan:  Jonathon Ryan is a 63 y.o. male with a symptomatic left inguinal hernia that is getting larger and causing him more problems. He is worried about this limiting his activity, and he wants to get it repaired. We discussed his cardiac issues, and the real risk from a cardiac standpoint for surgery.  We discussed the potential that he needs surgery at [facility] pending what Dr. [name] thinks regarding his cardiac status and anesthesia risk.   Discussed the risk and benefits including, bleeding, infection, use of mesh, risk of recurrence, risk of nerve damage causing numbness or changes in sensation, risk of damage to the cord structures. The patient understands the risk and benefits of repair with mesh, and has decided to proceed.  We also discussed open versus laparoscopic surgery and the use of mesh. We discussed that I do open repairs with mesh, and that this is considered equivalent to laparoscopic surgery. We discussed reasons for opting for laparoscopic surgery including if a bilateral repair is needed or if a patient has a recurrence after an open repair.  We discussed that he can use a hernia belt in the mean time, and the risk of incarceration and strangulation, and when to go to the ED.  He will see Dr. [name], and we will have him risk stratified. We will then determine the appropriate place for him to have surgery. A block or spinal could be an option, but I would have  to  discuss with anesthesia and ensure they were comfortable. We discussed that he would have to come off his Plavix, and that a spinal could result in urinary retention if that were the chosen plan.   Will have patient notify us once he has seen Dr. Allyson SabalBerry.   All questions were answered to the satisfaction of the patient.     Lucretia RoersLindsay C  09/10/2019, 3:02 PM

## 2019-09-11 ENCOUNTER — Ambulatory Visit (INDEPENDENT_AMBULATORY_CARE_PROVIDER_SITE_OTHER): Payer: Medicare HMO | Admitting: *Deleted

## 2019-09-11 DIAGNOSIS — I255 Ischemic cardiomyopathy: Secondary | ICD-10-CM | POA: Diagnosis not present

## 2019-09-12 LAB — CUP PACEART REMOTE DEVICE CHECK
Battery Remaining Percentage: 50 %
Date Time Interrogation Session: 20201204081300
Implantable Lead Implant Date: 20160616
Implantable Lead Location: 753860
Implantable Lead Model: 3401
Implantable Pulse Generator Implant Date: 20160616
Pulse Gen Serial Number: 116179

## 2019-09-14 DIAGNOSIS — K4090 Unilateral inguinal hernia, without obstruction or gangrene, not specified as recurrent: Secondary | ICD-10-CM | POA: Insufficient documentation

## 2019-09-24 ENCOUNTER — Other Ambulatory Visit: Payer: Self-pay | Admitting: Cardiovascular Disease

## 2019-10-14 ENCOUNTER — Ambulatory Visit: Payer: Medicare HMO | Admitting: Cardiovascular Disease

## 2019-12-11 ENCOUNTER — Ambulatory Visit (INDEPENDENT_AMBULATORY_CARE_PROVIDER_SITE_OTHER): Payer: Medicare HMO | Admitting: *Deleted

## 2019-12-11 DIAGNOSIS — Z95810 Presence of automatic (implantable) cardiac defibrillator: Secondary | ICD-10-CM | POA: Diagnosis not present

## 2019-12-11 LAB — CUP PACEART REMOTE DEVICE CHECK
Battery Remaining Percentage: 47 %
Date Time Interrogation Session: 20210305081000
Implantable Lead Implant Date: 20160616
Implantable Lead Location: 753860
Implantable Lead Model: 3401
Implantable Pulse Generator Implant Date: 20160616
Pulse Gen Serial Number: 116179

## 2019-12-12 NOTE — Progress Notes (Signed)
ICD remote 

## 2019-12-21 ENCOUNTER — Other Ambulatory Visit: Payer: Self-pay | Admitting: Family Medicine

## 2019-12-21 ENCOUNTER — Other Ambulatory Visit: Payer: Self-pay

## 2019-12-21 ENCOUNTER — Other Ambulatory Visit: Payer: Medicare HMO

## 2019-12-21 DIAGNOSIS — I10 Essential (primary) hypertension: Secondary | ICD-10-CM

## 2019-12-21 DIAGNOSIS — E782 Mixed hyperlipidemia: Secondary | ICD-10-CM

## 2019-12-21 NOTE — Progress Notes (Signed)
Placed orders for patient to come in before visit.

## 2019-12-22 LAB — CBC WITH DIFFERENTIAL/PLATELET
Basophils Absolute: 0 10*3/uL (ref 0.0–0.2)
Basos: 0 %
EOS (ABSOLUTE): 0.2 10*3/uL (ref 0.0–0.4)
Eos: 2 %
Hematocrit: 40.4 % (ref 37.5–51.0)
Hemoglobin: 13.7 g/dL (ref 13.0–17.7)
Immature Grans (Abs): 0 10*3/uL (ref 0.0–0.1)
Immature Granulocytes: 0 %
Lymphocytes Absolute: 1.6 10*3/uL (ref 0.7–3.1)
Lymphs: 21 %
MCH: 28.9 pg (ref 26.6–33.0)
MCHC: 33.9 g/dL (ref 31.5–35.7)
MCV: 85 fL (ref 79–97)
Monocytes Absolute: 0.5 10*3/uL (ref 0.1–0.9)
Monocytes: 6 %
Neutrophils Absolute: 5.4 10*3/uL (ref 1.4–7.0)
Neutrophils: 71 %
Platelets: 333 10*3/uL (ref 150–450)
RBC: 4.74 x10E6/uL (ref 4.14–5.80)
RDW: 12.4 % (ref 11.6–15.4)
WBC: 7.7 10*3/uL (ref 3.4–10.8)

## 2019-12-22 LAB — CMP14+EGFR
ALT: 19 IU/L (ref 0–44)
AST: 23 IU/L (ref 0–40)
Albumin/Globulin Ratio: 1.9 (ref 1.2–2.2)
Albumin: 4.8 g/dL (ref 3.8–4.8)
Alkaline Phosphatase: 64 IU/L (ref 39–117)
BUN/Creatinine Ratio: 16 (ref 10–24)
BUN: 14 mg/dL (ref 8–27)
Bilirubin Total: 0.2 mg/dL (ref 0.0–1.2)
CO2: 24 mmol/L (ref 20–29)
Calcium: 10.3 mg/dL — ABNORMAL HIGH (ref 8.6–10.2)
Chloride: 104 mmol/L (ref 96–106)
Creatinine, Ser: 0.9 mg/dL (ref 0.76–1.27)
GFR calc Af Amer: 103 mL/min/{1.73_m2} (ref >59)
GFR calc non Af Amer: 89 mL/min/{1.73_m2} (ref >59)
Globulin, Total: 2.5 g/dL (ref 1.5–4.5)
Glucose: 94 mg/dL (ref 65–99)
Potassium: 5 mmol/L (ref 3.5–5.2)
Sodium: 143 mmol/L (ref 134–144)
Total Protein: 7.3 g/dL (ref 6.0–8.5)

## 2019-12-22 LAB — LIPID PANEL
Chol/HDL Ratio: 4.9 ratio (ref 0.0–5.0)
Cholesterol, Total: 153 mg/dL (ref 100–199)
HDL: 31 mg/dL — ABNORMAL LOW (ref >39)
LDL Chol Calc (NIH): 78 mg/dL (ref 0–99)
Triglycerides: 265 mg/dL — ABNORMAL HIGH (ref 0–149)
VLDL Cholesterol Cal: 44 mg/dL — ABNORMAL HIGH (ref 5–40)

## 2019-12-24 ENCOUNTER — Encounter: Payer: Self-pay | Admitting: Family Medicine

## 2019-12-24 ENCOUNTER — Ambulatory Visit (INDEPENDENT_AMBULATORY_CARE_PROVIDER_SITE_OTHER): Payer: Medicare HMO | Admitting: Family Medicine

## 2019-12-24 DIAGNOSIS — F5101 Primary insomnia: Secondary | ICD-10-CM | POA: Diagnosis not present

## 2019-12-24 DIAGNOSIS — I10 Essential (primary) hypertension: Secondary | ICD-10-CM | POA: Diagnosis not present

## 2019-12-24 DIAGNOSIS — K219 Gastro-esophageal reflux disease without esophagitis: Secondary | ICD-10-CM | POA: Diagnosis not present

## 2019-12-24 DIAGNOSIS — E782 Mixed hyperlipidemia: Secondary | ICD-10-CM | POA: Diagnosis not present

## 2019-12-24 MED ORDER — CARVEDILOL 6.25 MG PO TABS: 6.2500 mg | ORAL_TABLET | Freq: Two times a day (BID) | ORAL | 3 refills | Status: DC

## 2019-12-24 MED ORDER — MIRTAZAPINE 30 MG PO TABS: 30.0000 mg | ORAL_TABLET | Freq: Every day | ORAL | 3 refills | Status: DC

## 2019-12-24 NOTE — Progress Notes (Signed)
Virtual Visit via telephone Note  I connected with Jonathon Ryan on 12/24/19 at 1052 by telephone and verified that I am speaking with the correct person using two identifiers. Jonathon Ryan is currently located at home and no other people are currently with her during visit. The provider, Fransisca Kaufmann Letonya Mangels, MD is located in their office at time of visit.  Call ended at 1102  I discussed the limitations, risks, security and privacy concerns of performing an evaluation and management service by telephone and the availability of in person appointments. I also discussed with the patient that there may be a patient responsible charge related to this service. The patient expressed understanding and agreed to proceed.  116/54 Hr 63 98% O2 History and Present Illness: Hypertension Patient is currently on carvedilol and lisinopril, and their blood pressure today is 116/54. Patient denies any lightheadedness or dizziness. Patient denies headaches, blurred vision, chest pains, shortness of breath, or weakness. Denies any side effects from medication and is content with current medication.   Hyperlipidemia Patient is coming in for recheck of his hyperlipidemia. The patient is currently taking crestor. They deny any issues with myalgias or history of liver damage from it. They deny any focal numbness or weakness or chest pain.   GERD Patient is currently on omeprazole.  She denies any major symptoms or abdominal pain or belching or burping. She denies any blood in her stool or lightheadedness or dizziness.   Insomnia,  Patient is on trazodone and it is not working.   No diagnosis found.  Outpatient Encounter Medications as of 12/24/2019  Medication Sig  . aspirin 81 MG tablet Take 81 mg by mouth daily.  . carvedilol (COREG) 25 MG tablet TAKE 1 TABLET BY MOUTH TWICE DAILY WITH MEALS  . clopidogrel (PLAVIX) 75 MG tablet Take 1 tablet by mouth once daily  . fenofibrate (TRICOR) 145 MG tablet Take 1  tablet by mouth once daily  . lisinopril (ZESTRIL) 10 MG tablet Take 1 tablet by mouth once daily  . omeprazole (PRILOSEC) 40 MG capsule Take 1 capsule (40 mg total) by mouth daily.  . rosuvastatin (CRESTOR) 40 MG tablet Take 1 tablet (40 mg total) by mouth daily.  . traZODone (DESYREL) 50 MG tablet Take 0.5-1 tablets (25-50 mg total) by mouth at bedtime as needed for sleep.   No facility-administered encounter medications on file as of 12/24/2019.    Review of Systems  Constitutional: Positive for fatigue. Negative for chills and fever.  Eyes: Negative for visual disturbance.  Respiratory: Negative for shortness of breath and wheezing.   Cardiovascular: Negative for chest pain and leg swelling.  Musculoskeletal: Negative for back pain and gait problem.  Skin: Negative for rash.  Neurological: Negative for dizziness, weakness and light-headedness.  All other systems reviewed and are negative.   Observations/Objective: Patient sounds comfortable and in no acute distress   Assessment and Plan: Problem List Items Addressed This Visit      Cardiovascular and Mediastinum   Hypertension - Primary   Relevant Medications   carvedilol (COREG) 6.25 MG tablet   Other Relevant Orders   CBC with Differential/Platelet   CMP14+EGFR     Digestive   GERD (gastroesophageal reflux disease)   Relevant Orders   CBC with Differential/Platelet     Other   HLD (hyperlipidemia)   Relevant Medications   carvedilol (COREG) 6.25 MG tablet   Other Relevant Orders   Lipid panel      Will lower carvedilol  because of fatigue and energy and low bp  Will try Remeron for sleep instead of trazodone.  Follow up plan: Return in about 6 months (around 06/25/2020), or if symptoms worsen or fail to improve, for htn and hld.     I discussed the assessment and treatment plan with the patient. The patient was provided an opportunity to ask questions and all were answered. The patient agreed with the plan  and demonstrated an understanding of the instructions.   The patient was advised to call back or seek an in-person evaluation if the symptoms worsen or if the condition fails to improve as anticipated.  The above assessment and management plan was discussed with the patient. The patient verbalized understanding of and has agreed to the management plan. Patient is aware to call the clinic if symptoms persist or worsen. Patient is aware when to return to the clinic for a follow-up visit. Patient educated on when it is appropriate to go to the emergency department.    I provided 10 minutes of non-face-to-face time during this encounter.    Worthy Rancher, MD

## 2019-12-25 ENCOUNTER — Encounter: Payer: Self-pay | Admitting: Internal Medicine

## 2019-12-25 ENCOUNTER — Ambulatory Visit: Payer: Medicare HMO | Admitting: Internal Medicine

## 2019-12-25 ENCOUNTER — Telehealth: Payer: Self-pay | Admitting: *Deleted

## 2019-12-25 VITALS — BP 136/72 | HR 72 | Temp 98.7°F | Ht 70.0 in | Wt 163.0 lb

## 2019-12-25 DIAGNOSIS — Z95810 Presence of automatic (implantable) cardiac defibrillator: Secondary | ICD-10-CM

## 2019-12-25 DIAGNOSIS — I255 Ischemic cardiomyopathy: Secondary | ICD-10-CM | POA: Diagnosis not present

## 2019-12-25 NOTE — Progress Notes (Signed)
HPI [name] returns today for followup. He is a pleasant 64 yo man with a h/o chronic systolic heart failure, ICM, s/p MI and is s/p S-ICD insertion. He has felt well. He has had thinning at his ICD insertion site and has a remote h/o his ICD eroding. He denies fever or chills.  No Known Allergies   Current Outpatient Medications  Medication Sig Dispense Refill  . aspirin 81 MG tablet Take 81 mg by mouth daily.    . carvedilol (COREG) 6.25 MG tablet Take 1 tablet (6.25 mg total) by mouth 2 (two) times daily with a meal. 180 tablet 3  . clopidogrel (PLAVIX) 75 MG tablet Take 1 tablet by mouth once daily 90 tablet 3  . fenofibrate (TRICOR) 145 MG tablet Take 1 tablet by mouth once daily 90 tablet 3  . lisinopril (ZESTRIL) 10 MG tablet Take 1 tablet by mouth once daily 90 tablet 3  . mirtazapine (REMERON) 30 MG tablet Take 1 tablet (30 mg total) by mouth at bedtime. 90 tablet 3  . omeprazole (PRILOSEC) 40 MG capsule Take 1 capsule (40 mg total) by mouth daily. 90 capsule 3  . rosuvastatin (CRESTOR) 40 MG tablet Take 1 tablet (40 mg total) by mouth daily. 90 tablet 3   No current facility-administered medications for this visit.     Past Medical History:  Diagnosis Date  . Anxiety   . CAD (coronary artery disease) 9/13   s/p CABG  . Cardiac arrest due to underlying cardiac condition ([place])    successfully rescucitated/ cooled  . Cardiac defibrillator in place   . Cerebrovascular disease 10/15/2012  . Chronic systolic dysfunction of left ventricle   . GERD (gastroesophageal reflux disease)   . Headache(784.0)   . Hyperlipidemia   . Hypertension   . Infection of pacemaker pocket ([place])    of ICD with subsequent explant  . Ischemic cardiomyopathy    s/p BiV ICD implant Feb 2014 - [place], [place] 2088TC-52 (serial # [id number]) right atrial lead and a [place], model 7122-65Q (serial number [id number]) right ventricular defibrillator lead. [place] - 86 (serial number [id number]) LV lead. [place] 3365 [phone] (serial Number [id number]) biventricular ICD.   [name] Left bundle branch block   . Peripheral arterial disease ([place])   . PONV (postoperative nausea and vomiting)   . Renal artery stenosis ([place])   . Shortness of breath dyspnea    with exertion  . Spinal headache     ROS:   All systems reviewed and negative except as noted in the HPI.   Past Surgical History:  Procedure Laterality Date  . BI-VENTRICULAR IMPLANTABLE CARDIOVERTER DEFIBRILLATOR [name] 11/25/2012   Procedure: BI-VENTRICULAR IMPLANTABLE CARDIOVERTER DEFIBRILLATOR  (CRT-D);  Surgeon: Thompson Grayer, MD;  Location: [facility] CATH LAB;  Service: Cardiovascular;  Laterality: [name];  . CAROTID ANGIOGRAM Bilateral 10/14/2012   Procedure: CAROTID ANGIOGRAM;  Surgeon: Serafina Mitchell, MD;  Location: [facility] CATH LAB;  Service: Cardiovascular;  Laterality: Bilateral;  . CAROTID STENT INSERTION [name] 10/14/2012   Procedure: CAROTID STENT INSERTION;  Surgeon: Serafina Mitchell, MD;  Location: [facility] CATH LAB;  Service: Cardiovascular;  Laterality: [name];  . COLONOSCOPY [name] 08/18/2015   Procedure: COLONOSCOPY;  Surgeon: Rogene Houston, MD;  [facility];  Service: Endoscopy;  Laterality: [name];  830  . CORONARY ARTERY BYPASS GRAFT  06/11/2012   Procedure: CORONARY ARTERY  BYPASS GRAFTING (CABG);  Surgeon: [name], MD;  Location: [facility] OR;  Service: Open Heart Surgery;  Laterality: [name];  Coronary Artery Bypass Grafting times four using left internal mammary artery and right greater saphenous vein endoscopically harvested.  . EP IMPLANTABLE DEVICE [name] 03/24/2015   Procedure: SubQ ICD Implant;  Surgeon: Marinus Maw, MD;  Location: [facility] INVASIVE CV LAB;  Service: Cardiovascular;  Laterality: [name];  . ICD LEAD REMOVAL Left 10/04/2014   Procedure: REMOVAL Saint Jude ICD with lead;  Surgeon: Marinus Maw, MD;  Location: [facility] OR;  Service: Cardiovascular;   Laterality: Left;  Jonathon Ryan is back up for [employer]  . LEFT HEART CATHETERIZATION WITH CORONARY ANGIOGRAM [name] 06/11/2012   Procedure: LEFT HEART CATHETERIZATION WITH CORONARY ANGIOGRAM;  Surgeon: Runell Gess, MD;  Location: [facility] CATH LAB;  Service: Cardiovascular;  Laterality: [name];  . PERCUTANEOUS PLACEMENT INTRAVASCULAR STENT CERVICAL CAROTID ARTERY       Family History  Problem Relation Age of Onset  . Cancer Half-Brother        prostate  . Cancer Half-Brother        mouth     Social History   Socioeconomic History  . Marital status: Married    Spouse name: [place]  . Number of children: 2  . Years of education: 10  . Highest education level: GED or equivalent  Occupational History  . Occupation: disability  Tobacco Use  . Smoking status: Former Smoker    Packs/day: 1.50    Years: 40.00    Pack years: 60.00    Types: Cigarettes    Quit date: 06/11/2012    Years since quitting: 7.5  . Smokeless tobacco: Never Used  Substance and Sexual Activity  . Alcohol use: Yes    Alcohol/week: 14.0 standard drinks    Types: 14 Cans of beer per week  . Drug use: Yes    Frequency: 2.0 times per week    Types: Marijuana    Comment: 2 per week  . Sexual activity: Yes    Comment: married and 2 step-daughters  Other Topics Concern  . Not on file  Social History Narrative  . Not on file   Social Determinants of Health   Financial Resource Strain: Low Risk   . Difficulty of Paying Living Expenses: Not hard at all  Food Insecurity: No Food Insecurity  . Worried About Programme researcher, broadcasting/film/video in the Last Year: Never true  . Ran Out of Food in the Last Year: Never true  Transportation Needs: No Transportation Needs  . Lack of Transportation (Medical): No  . Lack of Transportation (Non-Medical): No  Physical Activity: Sufficiently Active  . Days of Exercise per Week: 5 days  . Minutes of Exercise per Session: 30 min  Stress: No Stress Concern Present  . Feeling of Stress : Not at all   Social Connections: Somewhat Isolated  . Frequency of Communication with Friends and Family: More than three times a week  . Frequency of Social Gatherings with Friends and Family: More than three times a week  . Attends Religious Services: Never  . Active Member of Clubs or Organizations: No  . Attends Banker Meetings: Never  . Marital Status: Married  Catering manager Violence: Not At Risk  . Fear of Current or Ex-Partner: No  . Emotionally Abused: No  . Physically Abused: No  . Sexually Abused: No     BP 136/72   Pulse 72   Temp 98.7 F (37.1  C)   Ht 5\' 10"  (1.778 m)   Wt 163 lb (73.9 kg)   SpO2 97%   BMI 23.39 kg/m   Physical Exam:  Well appearing NAD HEENT: Unremarkable Neck:  No JVD, no thyromegally Lymphatics:  No adenopathy Back:  No CVA tenderness Lungs:  Clear with no wheezes HEART:  Regular rate rhythm, no murmurs, no rubs, no clicks Abd:  soft, positive bowel sounds, no organomegally, no rebound, no guarding Ext:  2 plus pulses, no edema, no cyanosis, no clubbing Skin:  No rashes no nodules Neuro:  CN II through XII intact, motor grossly intact  DEVICE  Normal device function.  See PaceArt for details.   Assess/Plan: 1. Chronic systolic heart failure - his symptoms are class 2. He will continue his current meds. 2. CAD - he is s/p MI. He denies anginal symptoms.  3. S-ICD - his device is currently working normally. He does have some thinning of his skin. He will undergo watchful waiting. I discussed the advisory notification with the patient and encouraged him to continue with remote monitoring.  .D.

## 2019-12-25 NOTE — Patient Instructions (Signed)
Medication Instructions:  Your physician recommends that you continue on your current medications as directed. Please refer to the Current Medication list given to you today.  *If you need a refill on your cardiac medications before your next appointment, please call your pharmacy*   Lab Work: NONE   If you have labs (blood work) drawn today and your tests are completely normal, you will receive your results only by: . MyChart Message (if you have MyChart) OR . A paper copy in the mail If you have any lab test that is abnormal or we need to change your treatment, we will call you to review the results.   Testing/Procedures: NONE    Follow-Up: At [facility], you and your health needs are our priority.  As part of our continuing mission to provide you with exceptional heart care, we have created designated Provider Care Teams.  These Care Teams include your primary Cardiologist (physician) and Advanced Practice Providers (APPs -  Physician Assistants and Nurse Practitioners) who all work together to provide you with the care you need, when you need it.  We recommend signing up for the patient portal called "MyChart".  Sign up information is provided on this After Visit Summary.  MyChart is used to connect with patients for Virtual Visits (Telemedicine).  Patients are able to view lab/test results, encounter notes, upcoming appointments, etc.  Non-urgent messages can be sent to your provider as well.   To learn more about what you can do with MyChart, go to [email or web address].    Your next appointment:   1 year(s)  The format for your next appointment:   In Person  Provider:   Gregg Taylor, MD   Other Instructions Thank you for choosing [facility]!    

## 2019-12-25 NOTE — Telephone Encounter (Signed)
Patient presented to appointment on 12/25/19 and was seen by [name].  See encounter note for details.

## 2019-12-25 NOTE — Telephone Encounter (Signed)
LMOVM requesting call back to [place] office.

## 2020-03-11 ENCOUNTER — Ambulatory Visit (INDEPENDENT_AMBULATORY_CARE_PROVIDER_SITE_OTHER): Payer: Medicare HMO | Admitting: *Deleted

## 2020-03-11 DIAGNOSIS — I255 Ischemic cardiomyopathy: Secondary | ICD-10-CM

## 2020-03-11 LAB — CUP PACEART REMOTE DEVICE CHECK
Battery Remaining Percentage: 44 %
Date Time Interrogation Session: 20210604081700
Implantable Lead Implant Date: 20160616
Implantable Lead Location: 753860
Implantable Lead Model: 3401
Implantable Pulse Generator Implant Date: 20160616
Pulse Gen Serial Number: 116179

## 2020-03-16 NOTE — Progress Notes (Signed)
Remote ICD transmission.   

## 2020-03-28 ENCOUNTER — Other Ambulatory Visit: Payer: Self-pay | Admitting: Cardiovascular Disease

## 2020-03-29 ENCOUNTER — Ambulatory Visit: Payer: Medicare HMO | Admitting: Cardiovascular Disease

## 2020-04-01 ENCOUNTER — Ambulatory Visit (HOSPITAL_COMMUNITY)
Admission: RE | Admit: 2020-04-01 | Discharge: 2020-04-01 | Disposition: A | Discharge status: Home / Self Care | Payer: Medicare HMO | Source: Ambulatory Visit | Attending: Cardiology | Admitting: Cardiology

## 2020-04-01 ENCOUNTER — Other Ambulatory Visit: Payer: Self-pay

## 2020-04-01 ENCOUNTER — Encounter: Payer: Self-pay | Admitting: Cardiovascular Disease

## 2020-04-01 ENCOUNTER — Ambulatory Visit (HOSPITAL_BASED_OUTPATIENT_CLINIC_OR_DEPARTMENT_OTHER)
Admission: RE | Admit: 2020-04-01 | Discharge: 2020-04-01 | Disposition: A | Discharge status: Home / Self Care | Payer: Medicare HMO | Source: Ambulatory Visit | Attending: Cardiovascular Disease | Admitting: Cardiovascular Disease

## 2020-04-01 ENCOUNTER — Other Ambulatory Visit (HOSPITAL_COMMUNITY): Payer: Self-pay | Admitting: Cardiovascular Disease

## 2020-04-01 ENCOUNTER — Ambulatory Visit: Payer: Medicare HMO | Admitting: Cardiovascular Disease

## 2020-04-01 VITALS — BP 148/80 | HR 56 | Ht 70.0 in | Wt 168.0 lb

## 2020-04-01 DIAGNOSIS — I6522 Occlusion and stenosis of left carotid artery: Secondary | ICD-10-CM

## 2020-04-01 DIAGNOSIS — I739 Peripheral vascular disease, unspecified: Secondary | ICD-10-CM

## 2020-04-01 DIAGNOSIS — I6523 Occlusion and stenosis of bilateral carotid arteries: Secondary | ICD-10-CM | POA: Diagnosis not present

## 2020-04-01 DIAGNOSIS — Z720 Tobacco use: Secondary | ICD-10-CM | POA: Diagnosis not present

## 2020-04-01 DIAGNOSIS — I255 Ischemic cardiomyopathy: Secondary | ICD-10-CM | POA: Diagnosis not present

## 2020-04-01 DIAGNOSIS — E782 Mixed hyperlipidemia: Secondary | ICD-10-CM | POA: Diagnosis not present

## 2020-04-01 DIAGNOSIS — I2510 Atherosclerotic heart disease of native coronary artery without angina pectoris: Secondary | ICD-10-CM

## 2020-04-01 DIAGNOSIS — I701 Atherosclerosis of renal artery: Secondary | ICD-10-CM

## 2020-04-01 DIAGNOSIS — I10 Essential (primary) hypertension: Secondary | ICD-10-CM

## 2020-04-01 DIAGNOSIS — Z95810 Presence of automatic (implantable) cardiac defibrillator: Secondary | ICD-10-CM

## 2020-04-01 DIAGNOSIS — Z95828 Presence of other vascular implants and grafts: Secondary | ICD-10-CM

## 2020-04-01 DIAGNOSIS — I2583 Coronary atherosclerosis due to lipid rich plaque: Secondary | ICD-10-CM

## 2020-04-01 NOTE — Assessment & Plan Note (Signed)
History of renal artery stenosis bilaterally with iliac disease at the time of cath. We have been following his renal Doppler studies most recently done 04/01/2020 which revealed stable renal artery stenosis with a right renal aortic ratio of 3.92 and a left of 2.93.

## 2020-04-01 NOTE — Assessment & Plan Note (Signed)
History of carotid artery stenosis status post carotid artery stenting by myself under the "Sapphire protocol" 10/14/2012. Recent carotid Dopplers reveal this to be widely open.

## 2020-04-01 NOTE — Progress Notes (Signed)
04/01/2020 Jonathon Ryan   1955-10-21  [id number]  Primary Physician Dettinger, Elige Radon, MD Primary Cardiologist: Runell Gess MD Nicholes Calamity, [place]  HPI:  Jonathon Ryan is a 64 y.o.  thin-appearing, married Caucasian male with no children who I last spoke to on the phone for a virtual telemedicine phone visit 03/18/2019. He had witnessed cardiac arrest with bystander CPR and defibrillation, arctic sun who presented for emergent cardiac catheterization by myself revealing left main 3-vessel disease and he ultimately underwent emergency coronary artery bypass grafting by Dr. Kathlee Nations Trigt with LIMA to his LAD, vein to a PDA, sequential vein to OM1 and 2. His EF was 25% to 30%. He made a full neurologic recovery. He is wearing a LifeVest. His other problems include hypertension and hyperlipidemia and discontinued tobacco abuse which he stopped June 21, 2012. He also has peripheral vascular occlusive disease with high-grade left internal carotid artery stenosis followed by duplex ultrasound, high-grade right renal artery stenosis and right iliac stenosis though he denies claudication. We have gotten Dopplers on both his renals and his lower extremities. He had a Myoview stress test that showed an inferior scar without ischemia with an EF in the 25%-30% range confirmed by 2D echo. I performed carotid stenting on him under the "sapphire protocol" for high risk asymptomatic patients on October 14, 2012, with an excellent result. Followup Dopplers of his carotidis performed on October 28, 2012, were normal. Dr. Hillis Range performed implantation of a BiV ICD on November 25, 2012, which has resulted in marked improvement in him clinically with regards to exercise tolerance. We continue to follow his carotid and renal Dopplers which have remained stable.His last carotid, renal and lower actually a partial Doppler studies performed in the last one 2 years have been stable.He denies claudication.  He did unfortunately suffer a pocket infection of his ICD requiring explant. He also had a subcutaneous ICD placed as followed by Dr.Taylor.  I saw him a year ago he is remained stable. He continues to not deny chest pain or shortness of breath.  Since I saw him 12 months ago he is remained stable.  He denies chest pain or shortness of breath.  He does not get out and play golf without limitation.  He does have a subcutaneous ICD and has had no discharges. He is followed by [name] for this who has noticed some thinning of the skin surrounding this without significant breakdown.   Current Meds  Medication Sig  . aspirin 81 MG tablet Take 81 mg by mouth daily.  . carvedilol (COREG) 25 MG tablet Take 25 mg by mouth 2 (two) times daily with a meal.  . clopidogrel (PLAVIX) 75 MG tablet Take 1 tablet by mouth once daily  . fenofibrate (TRICOR) 145 MG tablet Take 1 tablet by mouth once daily  . lisinopril (ZESTRIL) 10 MG tablet Take 1 tablet by mouth once daily  . omeprazole (PRILOSEC) 40 MG capsule Take 1 capsule (40 mg total) by mouth daily.  . rosuvastatin (CRESTOR) 40 MG tablet Take 1 tablet (40 mg total) by mouth daily.  . traZODone (DESYREL) 50 MG tablet Take 50 mg by mouth at bedtime.  . [DISCONTINUED] mirtazapine (REMERON) 30 MG tablet Take 1 tablet (30 mg total) by mouth at bedtime.     No Known Allergies  Social History   Socioeconomic History  . Marital status: Married    Spouse name: [place] Lions  . Number of children: 2  .  Years of education: 62  . Highest education level: GED or equivalent  Occupational History  . Occupation: disability  Tobacco Use  . Smoking status: Former Smoker    Packs/day: 1.50    Years: 40.00    Pack years: 60.00    Types: Cigarettes    Quit date: 06/11/2012    Years since quitting: 7.8  . Smokeless tobacco: Never Used  Vaping Use  . Vaping Use: Never used  Substance and Sexual Activity  . Alcohol use: Yes    Alcohol/week: 14.0 standard drinks      Types: 14 Cans of beer per week  . Drug use: Yes    Frequency: 2.0 times per week    Types: Marijuana    Comment: 2 per week  . Sexual activity: Yes    Comment: married and 2 step-daughters  Other Topics Concern  . Not on file  Social History Narrative  . Not on file   Social Determinants of Health   Financial Resource Strain: Low Risk   . Difficulty of Paying Living Expenses: Not hard at all  Food Insecurity: No Food Insecurity  . Worried About Charity fundraiser in the Last Year: Never true  . Ran Out of Food in the Last Year: Never true  Transportation Needs: No Transportation Needs  . Lack of Transportation (Medical): No  . Lack of Transportation (Non-Medical): No  Physical Activity: Sufficiently Active  . Days of Exercise per Week: 5 days  . Minutes of Exercise per Session: 30 min  Stress: No Stress Concern Present  . Feeling of Stress : Not at all  Social Connections: Moderately Isolated  . Frequency of Communication with Friends and Family: More than three times a week  . Frequency of Social Gatherings with Friends and Family: More than three times a week  . Attends Religious Services: Never  . Active Member of Clubs or Organizations: No  . Attends Archivist Meetings: Never  . Marital Status: Married  Human resources officer Violence: Not At Risk  . Fear of Current or Ex-Partner: No  . Emotionally Abused: No  . Physically Abused: No  . Sexually Abused: No     Review of Systems: General: negative for chills, fever, night sweats or weight changes.  Cardiovascular: negative for chest pain, dyspnea on exertion, edema, orthopnea, palpitations, paroxysmal nocturnal dyspnea or shortness of breath Dermatological: negative for rash Respiratory: negative for cough or wheezing Urologic: negative for hematuria Abdominal: negative for nausea, vomiting, diarrhea, bright red blood per rectum, melena, or hematemesis Neurologic: negative for visual changes, syncope, or  dizziness All other systems reviewed and are otherwise negative except as noted above.    Blood pressure (!) 148/80, pulse (!) 56, height 5\' 10"  (1.778 m), weight 168 lb (76.2 kg).  General appearance: alert and no distress Neck: no adenopathy, no carotid bruit, no JVD, supple, symmetrical, trachea midline and thyroid not enlarged, symmetric, no tenderness/mass/nodules Lungs: clear to auscultation bilaterally Heart: regular rate and rhythm, S1, S2 normal, no murmur, click, rub or gallop Extremities: extremities normal, atraumatic, no cyanosis or edema Pulses: 2+ and symmetric Skin: Skin color, texture, turgor normal. No rashes or lesions Neurologic: Alert and oriented X 3, normal strength and tone. Normal symmetric reflexes. Normal coordination and gait  EKG sinus bradycardia 56 with left bundle branch block. I personally reviewed this EKG.  ASSESSMENT AND PLAN:   Tobacco abuse Remote  HLD (hyperlipidemia) History of hyperlipidemia on statin therapy as well as fenofibrate with lipid profile  performed 12/21/2019 revealing total cholesterol 153, LDL 78 and HDL 43.  Ischemic cardiomyopathy History of witnessed cardiac arrest with CPR and defibrillation status post emergency cardiac catheterization by myself revealing left main/three-vessel disease. Ultimately underwent CABG by Dr. Maren Beach with a LIMA to his LAD, vein to the PDA and sequential vein to OM1 and OM 2. This was performed in September 2013. He denies chest pain or shortness of breath. His EF remains depressed in the 35% range.  PVD, 90% Rt RAS, 80% Rt Iliac at cath History of renal artery stenosis bilaterally with iliac disease at the time of cath. We have been following his renal Doppler studies most recently done 04/01/2020 which revealed stable renal artery stenosis with a right renal aortic ratio of 3.92 and a left of 2.93.  Carotid artery stenosis, LICA stent 10/14/12 History of carotid artery stenosis status post carotid  artery stenting by myself under the "Sapphire protocol" 10/14/2012. Recent carotid Dopplers reveal this to be widely open.  Hypertension History of essential hypertension with blood pressure measured today 148/80. He is on carvedilol and lisinopril.  Automatic implantable cardioverter-defibrillator in situ History of Bi V  ICD implant by [name] 11/25/2012. Unfortunately, he had a pocket infection and had this explanted. He had a subcutaneous ICD placed by [name] who follows this. He has not enjoyed an increase in his EF however.      Runell Gess MD FACP,[name],[name], [facility] 04/01/2020 11:50 AM

## 2020-04-01 NOTE — Assessment & Plan Note (Signed)
History of hyperlipidemia on statin therapy as well as fenofibrate with lipid profile performed 12/21/2019 revealing total cholesterol 153, LDL 78 and HDL 43.

## 2020-04-01 NOTE — Patient Instructions (Signed)
Medication Instructions:  Your Physician recommend you continue on your current medication as directed.    *If you need a refill on your cardiac medications before your next appointment, please call your pharmacy*   Lab Work: None  Testing/Procedures: None   Follow-Up: At [facility], you and your health needs are our priority.  As part of our continuing mission to provide you with exceptional heart care, we have created designated Provider Care Teams.  These Care Teams include your primary Cardiologist (physician) and Advanced Practice Providers (APPs -  Physician Assistants and Nurse Practitioners) who all work together to provide you with the care you need, when you need it.  We recommend signing up for the patient portal called "MyChart".  Sign up information is provided on this After Visit Summary.  MyChart is used to connect with patients for Virtual Visits (Telemedicine).  Patients are able to view lab/test results, encounter notes, upcoming appointments, etc.  Non-urgent messages can be sent to your provider as well.   To learn more about what you can do with MyChart, go to [email or web address].    Your next appointment:   1 year(s)  The format for your next appointment:   In Person  Provider:   Jonathan Berry, MD     

## 2020-04-01 NOTE — Assessment & Plan Note (Signed)
History of Bi V  ICD implant by [name] 11/25/2012. Unfortunately, he had a pocket infection and had this explanted. He had a subcutaneous ICD placed by [name] who follows this. He has not enjoyed an increase in his EF however.

## 2020-04-01 NOTE — Assessment & Plan Note (Signed)
Remote  

## 2020-04-01 NOTE — Assessment & Plan Note (Signed)
History of essential hypertension with blood pressure measured today 148/80. He is on carvedilol and lisinopril.

## 2020-04-01 NOTE — Assessment & Plan Note (Addendum)
History of witnessed cardiac arrest with CPR and defibrillation status post emergency cardiac catheterization by myself revealing left main/three-vessel disease. Ultimately underwent CABG by Dr. Maren Beach with a LIMA to his LAD, vein to the PDA and sequential vein to OM1 and OM 2. This was performed in September 2013. He denies chest pain or shortness of breath. His EF remains depressed in the 35% range.

## 2020-04-18 ENCOUNTER — Telehealth: Payer: Self-pay | Admitting: Internal Medicine

## 2020-04-18 NOTE — Telephone Encounter (Signed)
New message    Patient is going on a cross country trip for a year, does he need to take his monitor with him ?

## 2020-04-18 NOTE — Telephone Encounter (Signed)
Fwd to device clinic.

## 2020-04-18 NOTE — Telephone Encounter (Signed)
Patient advised to take remote monitor with him on his cross country trip. Patient will be gone for up to 1 year.

## 2020-05-17 ENCOUNTER — Other Ambulatory Visit: Payer: Self-pay | Admitting: Cardiovascular Disease

## 2020-05-17 DIAGNOSIS — I255 Ischemic cardiomyopathy: Secondary | ICD-10-CM

## 2020-05-27 ENCOUNTER — Other Ambulatory Visit: Payer: Self-pay | Admitting: Internal Medicine

## 2020-06-10 ENCOUNTER — Ambulatory Visit (INDEPENDENT_AMBULATORY_CARE_PROVIDER_SITE_OTHER): Payer: Medicare HMO | Admitting: *Deleted

## 2020-06-10 DIAGNOSIS — Z8674 Personal history of sudden cardiac arrest: Secondary | ICD-10-CM

## 2020-06-12 LAB — CUP PACEART REMOTE DEVICE CHECK
Battery Remaining Percentage: 42 %
Date Time Interrogation Session: 20210903112900
Implantable Lead Implant Date: 20160616
Implantable Lead Location: 753860
Implantable Lead Model: 3401
Implantable Pulse Generator Implant Date: 20160616
Pulse Gen Serial Number: 116179

## 2020-06-15 NOTE — Progress Notes (Signed)
Remote ICD transmission.   

## 2020-07-11 ENCOUNTER — Telehealth: Payer: Self-pay | Admitting: Internal Medicine

## 2020-07-11 NOTE — Telephone Encounter (Signed)
Picture received via mychart message.  Reviewed with Dr. Graciela Husbands and Dr. Lalla Brothers.  Both MD indicate pt should be seen for eval in office with possibility of scheduling extraction this week.  [name] is out of office this week.  Attempted to reach pt to determine if he is able to come for OV with Dr. Lalla Brothers tomorrow at 12:30pm (ok to overbook)  No answer, left message for pt to callback to confirm if able to come.    Sending message via my chart as well.

## 2020-07-11 NOTE — Telephone Encounter (Signed)
Spoke with pt, he states it is a known issue that he has had possibkle infection over ICD implant.  He went to [facility] at the instruction of on-call provider due to worsening infection.  He states the site is now draining purulent fluid and looks "worse"  He reprots at previous visit [name] indicated to call if the site looked worse as it may need to be removed.    Pt denies any chills or fever.    [facility] started pt on 10 day course of Doxycycline.    Instructions for sending photo via mychart have sent to pt, he indicated he will try to upload a picture as soon as he is somewhere with Internet connection.

## 2020-07-11 NOTE — Telephone Encounter (Signed)
New Message  Megan from [facility] called after hours line to let Dr Ladona Ridgel know the pt had Defib placed about 5 years ago, looks like the placement has changed and it also looks infected.   Please call patient

## 2020-07-12 ENCOUNTER — Encounter: Payer: Self-pay | Admitting: Internal Medicine

## 2020-07-12 ENCOUNTER — Other Ambulatory Visit: Payer: Self-pay

## 2020-07-12 ENCOUNTER — Encounter: Payer: Self-pay | Admitting: Cardiology

## 2020-07-12 ENCOUNTER — Ambulatory Visit (INDEPENDENT_AMBULATORY_CARE_PROVIDER_SITE_OTHER): Payer: Medicare HMO | Admitting: Cardiology

## 2020-07-12 ENCOUNTER — Other Ambulatory Visit (HOSPITAL_COMMUNITY)
Admission: RE | Admit: 2020-07-12 | Discharge: 2020-07-12 | Disposition: A | Discharge status: Home / Self Care | Payer: Medicare HMO | Source: Ambulatory Visit | Attending: Cardiology | Admitting: Cardiology

## 2020-07-12 VITALS — BP 112/64 | HR 61 | Ht 70.0 in | Wt 159.4 lb

## 2020-07-12 DIAGNOSIS — T829XXA Unspecified complication of cardiac and vascular prosthetic device, implant and graft, initial encounter: Secondary | ICD-10-CM | POA: Diagnosis not present

## 2020-07-12 DIAGNOSIS — I255 Ischemic cardiomyopathy: Secondary | ICD-10-CM | POA: Diagnosis not present

## 2020-07-12 DIAGNOSIS — Z20822 Contact with and (suspected) exposure to covid-19: Secondary | ICD-10-CM | POA: Insufficient documentation

## 2020-07-12 DIAGNOSIS — Z01812 Encounter for preprocedural laboratory examination: Secondary | ICD-10-CM | POA: Insufficient documentation

## 2020-07-12 DIAGNOSIS — Z95810 Presence of automatic (implantable) cardiac defibrillator: Secondary | ICD-10-CM | POA: Diagnosis not present

## 2020-07-12 LAB — CBC WITH DIFFERENTIAL/PLATELET
Basophils Absolute: 0 10*3/uL (ref 0.0–0.2)
Basos: 0 %
EOS (ABSOLUTE): 0.3 10*3/uL (ref 0.0–0.4)
Eos: 4 %
Hematocrit: 41.1 % (ref 37.5–51.0)
Hemoglobin: 14 g/dL (ref 13.0–17.7)
Lymphocytes Absolute: 1.7 10*3/uL (ref 0.7–3.1)
Lymphs: 23 %
MCH: 29.1 pg (ref 26.6–33.0)
MCHC: 34.1 g/dL (ref 31.5–35.7)
MCV: 85 fL (ref 79–97)
Monocytes Absolute: 0.6 10*3/uL (ref 0.1–0.9)
Monocytes: 8 %
Neutrophils Absolute: 5 10*3/uL (ref 1.4–7.0)
Neutrophils: 65 %
Platelets: 374 10*3/uL (ref 150–450)
RBC: 4.81 x10E6/uL (ref 4.14–5.80)
RDW: 13.6 % (ref 11.6–15.4)
WBC: 7.6 10*3/uL (ref 3.4–10.8)

## 2020-07-12 LAB — BASIC METABOLIC PANEL
BUN/Creatinine Ratio: 20 (ref 10–24)
BUN: 18 mg/dL (ref 8–27)
CO2: 24 mmol/L (ref 20–29)
Calcium: 10.2 mg/dL (ref 8.6–10.2)
Chloride: 103 mmol/L (ref 96–106)
Creatinine, Ser: 0.89 mg/dL (ref 0.76–1.27)
GFR calc Af Amer: 104 mL/min/{1.73_m2} (ref >59)
GFR calc non Af Amer: 90 mL/min/{1.73_m2} (ref >59)
Glucose: 89 mg/dL (ref 65–99)
Potassium: 4.2 mmol/L (ref 3.5–5.2)
Sodium: 137 mmol/L (ref 134–144)

## 2020-07-12 LAB — SARS CORONAVIRUS 2 (TAT 6-24 HRS): SARS Coronavirus 2: NEGATIVE

## 2020-07-12 NOTE — Patient Instructions (Signed)
Medication Instructions:  Your physician recommends that you continue on your current medications as directed. Please refer to the Current Medication list given to you today.  Labwork: You will get lab work today:  BMP and CBC  Testing/Procedures: None ordered.  Follow-Up:  SEE INSTRUCTION LETTER  Any Other Special Instructions Will Be Listed Below (If Applicable).  If you need a refill on your cardiac medications before your next appointment, please call your pharmacy.   

## 2020-07-12 NOTE — Progress Notes (Unsigned)
Pt with secondary prevention ICD for aborted cardiac arrest with erosion of SICD which requires explanatation  Ischemic cardiomyopathy, LBBB and chronic heart failure w previously implanted and removed endovascular device secondary to infection

## 2020-07-12 NOTE — H&P (View-Only) (Signed)
Electrophysiology Office Note:    Date:  07/12/2020   ID:  Jonathon Ryan, DOB 1955/11/28, MRN [id number]  PCP:  Dettinger, Elige Radon, MD  [facility] Cardiologist:  No primary care provider on file.  [facility] Electrophysiologist:  Lewayne Bunting, MD   Referring MD: Dettinger, Elige Radon, MD   Chief Complaint: S ICD erosion  History of Present Illness:    Jonathon Ryan is a 64 y.o. male who presents for an evaluation of S ICD erosion.  Patient tells me that for the last few days he has noticed his subcutaneous ICD generator had eroded through the left axillary incision.  Area of erosion is approximately 1 cm in diameter located on the inferior most border of the incision.  Surrounding area of tissue is adherent to the generator.  No pain in the pocket or visible pus draining.  Patient tells me that for months he has noticed skin darkening in that area and a progressive thinning of the tissue above the generator.  No fevers or chills.  There is also some mild discomfort along the left sternal border.  There is no swelling in this area.  Past Medical History:  Diagnosis Date  . Anxiety   . CAD (coronary artery disease) 9/13   s/p CABG  . Cardiac arrest due to underlying cardiac condition (HCC)    successfully rescucitated/ cooled  . Cardiac defibrillator in place   . Cerebrovascular disease 10/15/2012  . Chronic systolic dysfunction of left ventricle   . GERD (gastroesophageal reflux disease)   . Headache(784.0)   . Hyperlipidemia   . Hypertension   . Infection of pacemaker pocket (HCC)    of ICD with subsequent explant  . Ischemic cardiomyopathy    s/p BiV ICD implant Feb 2014 - [address]. [name] [place], [place] [address]RD-40 (serial # [id number]) right atrial lead and a [name] [place], model 8144-81E (serial number [id number]) right ventricular defibrillator lead. [name] Quartet model [phone] - 86 (serial number [id number]) LV lead. [name] Quadra Assura model [place]  9702 [phone] (serial Number [id number]) biventricular ICD.   [name] Left bundle branch block   . Peripheral arterial disease (HCC)   . PONV (postoperative nausea and vomiting)   . Renal artery stenosis (HCC)   . Shortness of breath dyspnea    with exertion  . Spinal headache     Past Surgical History:  Procedure Laterality Date  . BI-VENTRICULAR IMPLANTABLE CARDIOVERTER DEFIBRILLATOR [name] 11/25/2012   Procedure: BI-VENTRICULAR IMPLANTABLE CARDIOVERTER DEFIBRILLATOR  (CRT-D);  Surgeon: Hillis Range, MD;  Location: [facility] CATH LAB;  Service: Cardiovascular;  Laterality: [name];  . CAROTID ANGIOGRAM Bilateral 10/14/2012   Procedure: CAROTID ANGIOGRAM;  Surgeon: Nada Libman, MD;  Location: [facility] CATH LAB;  Service: Cardiovascular;  Laterality: Bilateral;  . CAROTID STENT INSERTION [name] 10/14/2012   Procedure: CAROTID STENT INSERTION;  Surgeon: Nada Libman, MD;  Location: [facility] CATH LAB;  Service: Cardiovascular;  Laterality: [name];  . COLONOSCOPY [name] 08/18/2015   Procedure: COLONOSCOPY;  Surgeon: Malissa Hippo, MD;  [facility] ENDO [facility];  Service: Endoscopy;  Laterality: [name];  830  . CORONARY ARTERY BYPASS GRAFT  06/11/2012   Procedure: CORONARY ARTERY BYPASS GRAFTING (CABG);  Surgeon: [name], MD;  Location: [facility] OR;  Service: Open Heart Surgery;  Laterality: [name];  Coronary Artery Bypass Grafting times four using left internal mammary artery and right greater saphenous vein endoscopically harvested.  . EP IMPLANTABLE DEVICE [name] 03/24/2015   Procedure:  SubQ ICD Implant;  Surgeon: Marinus Maw, MD;  Location: [facility] INVASIVE CV LAB;  Service: Cardiovascular;  Laterality: [name];  . ICD LEAD REMOVAL Left 10/04/2014   Procedure: REMOVAL Saint Jude ICD with lead;  Surgeon: Marinus Maw, MD;  Location: [facility] OR;  Service: Cardiovascular;  Laterality: Left;  [name] is back up for [employer]  . LEFT HEART CATHETERIZATION WITH CORONARY ANGIOGRAM [name] 06/11/2012   Procedure: LEFT HEART CATHETERIZATION WITH CORONARY ANGIOGRAM;   Surgeon: Runell Gess, MD;  Location: [facility] CATH LAB;  Service: Cardiovascular;  Laterality: [name];  . PERCUTANEOUS PLACEMENT INTRAVASCULAR STENT CERVICAL CAROTID ARTERY      Current Medications: Current Meds  Medication Sig  . aspirin 81 MG tablet Take 81 mg by mouth daily.  . carvedilol (COREG) 12.5 MG tablet Take 25 mg by mouth 2 (two) times daily with a meal.   . clopidogrel (PLAVIX) 75 MG tablet Take 1 tablet by mouth once daily (Patient taking differently: Take 75 mg by mouth daily. )  . doxycycline (VIBRA-TABS) 100 MG tablet Take 100 mg by mouth 2 (two) times daily.  . fenofibrate (TRICOR) 145 MG tablet Take 1 tablet by mouth once daily (Patient taking differently: Take 145 mg by mouth daily. )  . lisinopril (ZESTRIL) 10 MG tablet Take 1 tablet by mouth once daily (Patient taking differently: Take 10 mg by mouth daily. )  . mirtazapine (REMERON) 30 MG tablet Take 30 mg by mouth at bedtime.  [name] omeprazole (PRILOSEC) 40 MG capsule Take 1 capsule (40 mg total) by mouth daily.  . rosuvastatin (CRESTOR) 40 MG tablet Take 1 tablet (40 mg total) by mouth daily.     Allergies:   Patient has no known allergies.   Social History   Socioeconomic History  . Marital status: Married    Spouse name: [place] Lions  . Number of children: 2  . Years of education: 10  . Highest education level: GED or equivalent  Occupational History  . Occupation: disability  Tobacco Use  . Smoking status: Former Smoker    Packs/day: 1.50    Years: 40.00    Pack years: 60.00    Types: Cigarettes    Quit date: 06/11/2012    Years since quitting: 8.0  . Smokeless tobacco: Never Used  Vaping Use  . Vaping Use: Never used  Substance and Sexual Activity  . Alcohol use: Yes    Alcohol/week: 14.0 standard drinks    Types: 14 Cans of beer per week  . Drug use: Yes    Frequency: 2.0 times per week    Types: Marijuana    Comment: 2 per week  . Sexual activity: Yes    Comment: married and 2 step-daughters  Other  Topics Concern  . Not on file  Social History Narrative  . Not on file   Social Determinants of Health   Financial Resource Strain: Low Risk   . Difficulty of Paying Living Expenses: Not hard at all  Food Insecurity: No Food Insecurity  . Worried About Programme researcher, broadcasting/film/video in the Last Year: Never true  . Ran Out of Food in the Last Year: Never true  Transportation Needs: No Transportation Needs  . Lack of Transportation (Medical): No  . Lack of Transportation (Non-Medical): No  Physical Activity: Sufficiently Active  . Days of Exercise per Week: 5 days  . Minutes of Exercise per Session: 30 min  Stress: No Stress Concern Present  . Feeling of Stress : Not at all  Social Connections: Moderately Isolated  . Frequency of Communication with Friends and Family: More than three times a week  . Frequency of Social Gatherings with Friends and Family: More than three times a week  . Attends Religious Services: Never  . Active Member of Clubs or Organizations: No  . Attends Banker Meetings: Never  . Marital Status: Married     Family History: The patient's family history includes Cancer in his half-brother and half-brother.  ROS:   Please see the history of present illness.    All other systems reviewed and are negative.  EKGs/Labs/Other Studies Reviewed:     Recent Labs: 12/21/2019: ALT 19 07/12/2020: BUN 18; Creatinine, Ser 0.89; Hemoglobin 14.0; Platelets 374; Potassium 4.2; Sodium 137  Recent Lipid Panel    Component Value Date/Time   CHOL 153 12/21/2019 0830   TRIG 265 (H) 12/21/2019 0830   HDL 31 (L) 12/21/2019 0830   CHOLHDL 4.9 12/21/2019 0830   CHOLHDL 6.0 01/06/2014 1342   VLDL 71 (H) 01/06/2014 1342   LDLCALC 78 12/21/2019 0830    Physical Exam:    VS:  BP 112/64   Pulse 61   Ht 5\' 10"  (1.778 m)   Wt 159 lb 6.4 oz (72.3 kg)   SpO2 98%   BMI 22.87 kg/m     Wt Readings from Last 3 Encounters:  07/12/20 159 lb 6.4 oz (72.3 kg)  04/01/20 168  lb (76.2 kg)  12/25/19 163 lb (73.9 kg)     GEN:  Well nourished, well developed in no acute distress HEENT: Normal NECK: No JVD; No carotid bruits LYMPHATICS: No lymphadenopathy CARDIAC: RRR, no murmurs, rubs, gallops.  Subxiphoid incision well-healed without surrounding erythema.  Mid axillary incision with a 1 cm in diameter open wound with visible ICD generator within the dehisced incision.  Surrounding tissue is hyperpigmented.  Tissue surrounding the subcutaneous ICD generator is adherent to the can.  No tenderness in this area.  There are some mild tenderness on the left sternal border in the area of the subcutaneous lead. RESPIRATORY:  Clear to auscultation without rales, wheezing or rhonchi  ABDOMEN: Soft, non-tender, non-distended MUSCULOSKELETAL:  No edema; No deformity  SKIN: Warm and dry NEUROLOGIC:  Alert and oriented x 3 PSYCHIATRIC:  Normal affect   ASSESSMENT:    1. Ischemic cardiomyopathy   2. ICD (implantable cardioverter-defibrillator) in place   3. Infection involving implantable cardioverter-defibrillator (ICD), initial encounter (HCC)    PLAN:    In order of problems listed above:  1. Ischemic cardiomyopathy status post ICD Patient has a history of a prior transvenous device that was complicated by skin erosion.  The device was successfully extracted and the patient had a subsequent implant of a subcutaneous ICD.  He now presents with erosion of this device.  Given this is the second occurrence of an erosion of the device, it raises the question whether or not he has an intolerance to the metallic components surrounding the generator.  Regardless of the cause, it will require extraction.  We will plan to do this tomorrow under general anesthesia.  We will have to debride the mid axillary area given the diseased tissue surrounding the previous incision.  Plan to closely inspect the area surrounding the subxiphoid incision and left sternal border.  Risks and benefits  of the procedure were explained in detail to the patient and he wishes to proceed.   Medication Adjustments/Labs and Tests Ordered: Current medicines are reviewed at length with the patient  today.  Concerns regarding medicines are outlined above.  Orders Placed This Encounter  Procedures  . CBC w/Diff  . Basic Metabolic Panel (BMET)  . EKG 12-Lead   No orders of the defined types were placed in this encounter.    Signed, Steffanie Dunnameron Ireanna Finlayson, MD, [facility]  07/12/2020 5:09 PM    Electrophysiology [facility]

## 2020-07-12 NOTE — Telephone Encounter (Signed)
I woul like to see him next week. Ok to [employer]. GT

## 2020-07-12 NOTE — Progress Notes (Signed)
Electrophysiology Office Note:    Date:  07/12/2020   ID:  ISAIAH CIANCI, DOB 1955/11/28, MRN [id number]  PCP:  Dettinger, Elige Radon, MD  [facility] Cardiologist:  No primary care provider on file.  [facility] Electrophysiologist:  Lewayne Bunting, MD   Referring MD: Dettinger, Elige Radon, MD   Chief Complaint: S ICD erosion  History of Present Illness:    Jonathon Ryan is a 64 y.o. male who presents for an evaluation of S ICD erosion.  Patient tells me that for the last few days he has noticed his subcutaneous ICD generator had eroded through the left axillary incision.  Area of erosion is approximately 1 cm in diameter located on the inferior most border of the incision.  Surrounding area of tissue is adherent to the generator.  No pain in the pocket or visible pus draining.  Patient tells me that for months he has noticed skin darkening in that area and a progressive thinning of the tissue above the generator.  No fevers or chills.  There is also some mild discomfort along the left sternal border.  There is no swelling in this area.  Past Medical History:  Diagnosis Date  . Anxiety   . CAD (coronary artery disease) 9/13   s/p CABG  . Cardiac arrest due to underlying cardiac condition (HCC)    successfully rescucitated/ cooled  . Cardiac defibrillator in place   . Cerebrovascular disease 10/15/2012  . Chronic systolic dysfunction of left ventricle   . GERD (gastroesophageal reflux disease)   . Headache(784.0)   . Hyperlipidemia   . Hypertension   . Infection of pacemaker pocket (HCC)    of ICD with subsequent explant  . Ischemic cardiomyopathy    s/p BiV ICD implant Feb 2014 - [address]. [name] [place], [place] [address]RD-40 (serial # [id number]) right atrial lead and a [name] [place], model 8144-81E (serial number [id number]) right ventricular defibrillator lead. [name] Quartet model [phone] - 86 (serial number [id number]) LV lead. [name] Quadra [place]ura model [place]  9702 [phone] (serial Number [id number]) biventricular ICD.   [name] Left bundle branch block   . Peripheral arterial disease (HCC)   . PONV (postoperative nausea and vomiting)   . Renal artery stenosis (HCC)   . Shortness of breath dyspnea    with exertion  . Spinal headache     Past Surgical History:  Procedure Laterality Date  . BI-VENTRICULAR IMPLANTABLE CARDIOVERTER DEFIBRILLATOR [name] 11/25/2012   Procedure: BI-VENTRICULAR IMPLANTABLE CARDIOVERTER DEFIBRILLATOR  (CRT-D);  Surgeon: Hillis Range, MD;  Location: [facility] CATH LAB;  Service: Cardiovascular;  Laterality: [name];  . CAROTID ANGIOGRAM Bilateral 10/14/2012   Procedure: CAROTID ANGIOGRAM;  Surgeon: Nada Libman, MD;  Location: [facility] CATH LAB;  Service: Cardiovascular;  Laterality: Bilateral;  . CAROTID STENT INSERTION [name] 10/14/2012   Procedure: CAROTID STENT INSERTION;  Surgeon: Nada Libman, MD;  Location: [facility] CATH LAB;  Service: Cardiovascular;  Laterality: [name];  . COLONOSCOPY [name] 08/18/2015   Procedure: COLONOSCOPY;  Surgeon: Malissa Hippo, MD;  [facility];  Service: Endoscopy;  Laterality: [name];  830  . CORONARY ARTERY BYPASS GRAFT  06/11/2012   Procedure: CORONARY ARTERY BYPASS GRAFTING (CABG);  Surgeon: [name], MD;  Location: [facility] OR;  Service: Open Heart Surgery;  Laterality: [name];  Coronary Artery Bypass Grafting times four using left internal mammary artery and right greater saphenous vein endoscopically harvested.  . EP IMPLANTABLE DEVICE [name] 03/24/2015   Procedure:  SubQ ICD Implant;  Surgeon: Marinus Maw, MD;  Location: [facility] INVASIVE CV LAB;  Service: Cardiovascular;  Laterality: [name];  . ICD LEAD REMOVAL Left 10/04/2014   Procedure: REMOVAL Saint Jude ICD with lead;  Surgeon: Marinus Maw, MD;  Location: [facility] OR;  Service: Cardiovascular;  Laterality: Left;  [name] is back up for [employer]  . LEFT HEART CATHETERIZATION WITH CORONARY ANGIOGRAM [name] 06/11/2012   Procedure: LEFT HEART CATHETERIZATION WITH CORONARY ANGIOGRAM;   Surgeon: Runell Gess, MD;  Location: [facility] CATH LAB;  Service: Cardiovascular;  Laterality: [name];  . PERCUTANEOUS PLACEMENT INTRAVASCULAR STENT CERVICAL CAROTID ARTERY      Current Medications: Current Meds  Medication Sig  . aspirin 81 MG tablet Take 81 mg by mouth daily.  . carvedilol (COREG) 12.5 MG tablet Take 25 mg by mouth 2 (two) times daily with a meal.   . clopidogrel (PLAVIX) 75 MG tablet Take 1 tablet by mouth once daily (Patient taking differently: Take 75 mg by mouth daily. )  . doxycycline (VIBRA-TABS) 100 MG tablet Take 100 mg by mouth 2 (two) times daily.  . fenofibrate (TRICOR) 145 MG tablet Take 1 tablet by mouth once daily (Patient taking differently: Take 145 mg by mouth daily. )  . lisinopril (ZESTRIL) 10 MG tablet Take 1 tablet by mouth once daily (Patient taking differently: Take 10 mg by mouth daily. )  . mirtazapine (REMERON) 30 MG tablet Take 30 mg by mouth at bedtime.  [name] omeprazole (PRILOSEC) 40 MG capsule Take 1 capsule (40 mg total) by mouth daily.  . rosuvastatin (CRESTOR) 40 MG tablet Take 1 tablet (40 mg total) by mouth daily.     Allergies:   Patient has no known allergies.   Social History   Socioeconomic History  . Marital status: Married    Spouse name: [place] Lions  . Number of children: 2  . Years of education: 10  . Highest education level: GED or equivalent  Occupational History  . Occupation: disability  Tobacco Use  . Smoking status: Former Smoker    Packs/day: 1.50    Years: 40.00    Pack years: 60.00    Types: Cigarettes    Quit date: 06/11/2012    Years since quitting: 8.0  . Smokeless tobacco: Never Used  Vaping Use  . Vaping Use: Never used  Substance and Sexual Activity  . Alcohol use: Yes    Alcohol/week: 14.0 standard drinks    Types: 14 Cans of beer per week  . Drug use: Yes    Frequency: 2.0 times per week    Types: Marijuana    Comment: 2 per week  . Sexual activity: Yes    Comment: married and 2 step-daughters  Other  Topics Concern  . Not on file  Social History Narrative  . Not on file   Social Determinants of Health   Financial Resource Strain: Low Risk   . Difficulty of Paying Living Expenses: Not hard at all  Food Insecurity: No Food Insecurity  . Worried About Programme researcher, broadcasting/film/video in the Last Year: Never true  . Ran Out of Food in the Last Year: Never true  Transportation Needs: No Transportation Needs  . Lack of Transportation (Medical): No  . Lack of Transportation (Non-Medical): No  Physical Activity: Sufficiently Active  . Days of Exercise per Week: 5 days  . Minutes of Exercise per Session: 30 min  Stress: No Stress Concern Present  . Feeling of Stress : Not at all  Social Connections: Moderately Isolated  . Frequency of Communication with Friends and Family: More than three times a week  . Frequency of Social Gatherings with Friends and Family: More than three times a week  . Attends Religious Services: Never  . Active Member of Clubs or Organizations: No  . Attends Banker Meetings: Never  . Marital Status: Married     Family History: The patient's family history includes Cancer in his half-brother and half-brother.  ROS:   Please see the history of present illness.    All other systems reviewed and are negative.  EKGs/Labs/Other Studies Reviewed:     Recent Labs: 12/21/2019: ALT 19 07/12/2020: BUN 18; Creatinine, Ser 0.89; Hemoglobin 14.0; Platelets 374; Potassium 4.2; Sodium 137  Recent Lipid Panel    Component Value Date/Time   CHOL 153 12/21/2019 0830   TRIG 265 (H) 12/21/2019 0830   HDL 31 (L) 12/21/2019 0830   CHOLHDL 4.9 12/21/2019 0830   CHOLHDL 6.0 01/06/2014 1342   VLDL 71 (H) 01/06/2014 1342   LDLCALC 78 12/21/2019 0830    Physical Exam:    VS:  BP 112/64   Pulse 61   Ht 5\' 10"  (1.778 m)   Wt 159 lb 6.4 oz (72.3 kg)   SpO2 98%   BMI 22.87 kg/m     Wt Readings from Last 3 Encounters:  07/12/20 159 lb 6.4 oz (72.3 kg)  04/01/20 168  lb (76.2 kg)  12/25/19 163 lb (73.9 kg)     GEN:  Well nourished, well developed in no acute distress HEENT: Normal NECK: No JVD; No carotid bruits LYMPHATICS: No lymphadenopathy CARDIAC: RRR, no murmurs, rubs, gallops.  Subxiphoid incision well-healed without surrounding erythema.  Mid axillary incision with a 1 cm in diameter open wound with visible ICD generator within the dehisced incision.  Surrounding tissue is hyperpigmented.  Tissue surrounding the subcutaneous ICD generator is adherent to the can.  No tenderness in this area.  There are some mild tenderness on the left sternal border in the area of the subcutaneous lead. RESPIRATORY:  Clear to auscultation without rales, wheezing or rhonchi  ABDOMEN: Soft, non-tender, non-distended MUSCULOSKELETAL:  No edema; No deformity  SKIN: Warm and dry NEUROLOGIC:  Alert and oriented x 3 PSYCHIATRIC:  Normal affect   ASSESSMENT:    1. Ischemic cardiomyopathy   2. ICD (implantable cardioverter-defibrillator) in place   3. Infection involving implantable cardioverter-defibrillator (ICD), initial encounter (HCC)    PLAN:    In order of problems listed above:  1. Ischemic cardiomyopathy status post ICD Patient has a history of a prior transvenous device that was complicated by skin erosion.  The device was successfully extracted and the patient had a subsequent implant of a subcutaneous ICD.  He now presents with erosion of this device.  Given this is the second occurrence of an erosion of the device, it raises the question whether or not he has an intolerance to the metallic components surrounding the generator.  Regardless of the cause, it will require extraction.  We will plan to do this tomorrow under general anesthesia.  We will have to debride the mid axillary area given the diseased tissue surrounding the previous incision.  Plan to closely inspect the area surrounding the subxiphoid incision and left sternal border.  Risks and benefits  of the procedure were explained in detail to the patient and he wishes to proceed.   Medication Adjustments/Labs and Tests Ordered: Current medicines are reviewed at length with the patient  today.  Concerns regarding medicines are outlined above.  Orders Placed This Encounter  Procedures  . CBC w/Diff  . Basic Metabolic Panel (BMET)  . EKG 12-Lead   No orders of the defined types were placed in this encounter.    Signed, Karysa Heft, MD, [name]  07/12/2020 5:09 PM    Electrophysiology [facility] 

## 2020-07-13 ENCOUNTER — Ambulatory Visit (HOSPITAL_COMMUNITY): Payer: Medicare HMO | Admitting: Certified Registered Nurse Anesthetist

## 2020-07-13 ENCOUNTER — Other Ambulatory Visit: Payer: Self-pay

## 2020-07-13 ENCOUNTER — Observation Stay (HOSPITAL_COMMUNITY)
Admission: RE | Admit: 2020-07-13 | Discharge: 2020-07-14 | Disposition: A | Discharge status: Home / Self Care | Payer: Medicare HMO | Attending: Cardiology | Admitting: Cardiology

## 2020-07-13 ENCOUNTER — Encounter (HOSPITAL_COMMUNITY): Payer: Self-pay | Admitting: Cardiology

## 2020-07-13 ENCOUNTER — Ambulatory Visit (HOSPITAL_COMMUNITY)
Admission: RE | Disposition: A | Discharge status: Home / Self Care | Payer: Self-pay | Source: Home / Self Care | Attending: Cardiology

## 2020-07-13 ENCOUNTER — Telehealth: Payer: Self-pay | Admitting: Internal Medicine

## 2020-07-13 DIAGNOSIS — T827XXA Infection and inflammatory reaction due to other cardiac and vascular devices, implants and grafts, initial encounter: Secondary | ICD-10-CM | POA: Insufficient documentation

## 2020-07-13 DIAGNOSIS — Z87891 Personal history of nicotine dependence: Secondary | ICD-10-CM | POA: Diagnosis not present

## 2020-07-13 DIAGNOSIS — Z950 Presence of cardiac pacemaker: Secondary | ICD-10-CM | POA: Diagnosis not present

## 2020-07-13 DIAGNOSIS — Z951 Presence of aortocoronary bypass graft: Secondary | ICD-10-CM | POA: Insufficient documentation

## 2020-07-13 DIAGNOSIS — I255 Ischemic cardiomyopathy: Principal | ICD-10-CM | POA: Insufficient documentation

## 2020-07-13 DIAGNOSIS — I110 Hypertensive heart disease with heart failure: Secondary | ICD-10-CM | POA: Diagnosis not present

## 2020-07-13 DIAGNOSIS — Z7902 Long term (current) use of antithrombotics/antiplatelets: Secondary | ICD-10-CM | POA: Insufficient documentation

## 2020-07-13 DIAGNOSIS — I5022 Chronic systolic (congestive) heart failure: Secondary | ICD-10-CM | POA: Diagnosis not present

## 2020-07-13 DIAGNOSIS — Z79899 Other long term (current) drug therapy: Secondary | ICD-10-CM | POA: Diagnosis not present

## 2020-07-13 DIAGNOSIS — I2510 Atherosclerotic heart disease of native coronary artery without angina pectoris: Secondary | ICD-10-CM | POA: Insufficient documentation

## 2020-07-13 DIAGNOSIS — Z7982 Long term (current) use of aspirin: Secondary | ICD-10-CM | POA: Insufficient documentation

## 2020-07-13 DIAGNOSIS — Z23 Encounter for immunization: Secondary | ICD-10-CM | POA: Insufficient documentation

## 2020-07-13 HISTORY — PX: SUBQ ICD CHANGEOUT: EP1235

## 2020-07-13 SURGERY — SUBQ ICD CHANGEOUT
Anesthesia: General

## 2020-07-13 MED ORDER — ONDANSETRON HCL 4 MG/2ML IJ SOLN: 4.0000 mg | Freq: Four times a day (QID) | INTRAMUSCULAR | Status: DC | PRN

## 2020-07-13 MED ORDER — CEFAZOLIN SODIUM-DEXTROSE 1-4 GM/50ML-% IV SOLN
1.0000 g | Freq: Four times a day (QID) | INTRAVENOUS | Status: AC
  Administered 2020-07-13 – 2020-07-14 (×3): 1 g via INTRAVENOUS
  Filled 2020-07-13 (×3): qty 50

## 2020-07-13 MED ORDER — ACETAMINOPHEN 325 MG PO TABS: 325.0000 mg | ORAL_TABLET | ORAL | Status: DC | PRN

## 2020-07-13 MED ORDER — SODIUM CHLORIDE 0.9 % IV SOLN
INTRAVENOUS | Status: AC
  Filled 2020-07-13: qty 2

## 2020-07-13 MED ORDER — MIRTAZAPINE 30 MG PO TABS
30.0000 mg | ORAL_TABLET | Freq: Every day | ORAL | Status: DC
  Administered 2020-07-13: 30 mg via ORAL
  Filled 2020-07-13: qty 1

## 2020-07-13 MED ORDER — BUPIVACAINE HCL (PF) 0.25 % IJ SOLN
INTRAMUSCULAR | Status: DC | PRN
  Administered 2020-07-13: 30 mL

## 2020-07-13 MED ORDER — HEPARIN (PORCINE) IN NACL 1000-0.9 UT/500ML-% IV SOLN
INTRAVENOUS | Status: AC
  Filled 2020-07-13: qty 500

## 2020-07-13 MED ORDER — PROPOFOL 10 MG/ML IV BOLUS
INTRAVENOUS | Status: DC | PRN
  Administered 2020-07-13: 80 mg via INTRAVENOUS
  Administered 2020-07-13: 20 mg via INTRAVENOUS

## 2020-07-13 MED ORDER — SUGAMMADEX SODIUM 200 MG/2ML IV SOLN
INTRAVENOUS | Status: DC | PRN
  Administered 2020-07-13: 150 mg via INTRAVENOUS

## 2020-07-13 MED ORDER — INFLUENZA VAC A&B SA ADJ QUAD 0.5 ML IM PRSY
0.5000 mL | PREFILLED_SYRINGE | INTRAMUSCULAR | Status: AC
  Administered 2020-07-14: 0.5 mL via INTRAMUSCULAR
  Filled 2020-07-13: qty 0.5

## 2020-07-13 MED ORDER — CEFAZOLIN SODIUM-DEXTROSE 2-4 GM/100ML-% IV SOLN
2.0000 g | INTRAVENOUS | Status: AC
  Administered 2020-07-13: 2 g via INTRAVENOUS

## 2020-07-13 MED ORDER — LIDOCAINE HCL 1 % IJ SOLN
INTRAMUSCULAR | Status: AC
  Filled 2020-07-13: qty 60

## 2020-07-13 MED ORDER — MIDAZOLAM HCL 2 MG/2ML IJ SOLN
INTRAMUSCULAR | Status: AC
  Filled 2020-07-13: qty 2

## 2020-07-13 MED ORDER — CARVEDILOL 25 MG PO TABS
25.0000 mg | ORAL_TABLET | Freq: Two times a day (BID) | ORAL | Status: DC
  Administered 2020-07-13 – 2020-07-14 (×2): 25 mg via ORAL
  Filled 2020-07-13 (×2): qty 1

## 2020-07-13 MED ORDER — LIDOCAINE 2% (20 MG/ML) 5 ML SYRINGE
INTRAMUSCULAR | Status: DC | PRN
  Administered 2020-07-13: 60 mg via INTRAVENOUS

## 2020-07-13 MED ORDER — HEPARIN (PORCINE) IN NACL 1000-0.9 UT/500ML-% IV SOLN
INTRAVENOUS | Status: DC | PRN
  Administered 2020-07-13: 500 mL

## 2020-07-13 MED ORDER — SODIUM CHLORIDE 0.9 % IV SOLN
80.0000 mg | INTRAVENOUS | Status: AC
  Administered 2020-07-13: 80 mg

## 2020-07-13 MED ORDER — PANTOPRAZOLE SODIUM 40 MG PO TBEC
40.0000 mg | DELAYED_RELEASE_TABLET | Freq: Every day | ORAL | Status: DC
  Administered 2020-07-14: 40 mg via ORAL
  Filled 2020-07-13: qty 1

## 2020-07-13 MED ORDER — BUPIVACAINE HCL (PF) 0.25 % IJ SOLN
INTRAMUSCULAR | Status: AC
  Filled 2020-07-13: qty 30

## 2020-07-13 MED ORDER — CEFAZOLIN SODIUM-DEXTROSE 2-4 GM/100ML-% IV SOLN
INTRAVENOUS | Status: AC
  Filled 2020-07-13: qty 100

## 2020-07-13 MED ORDER — HYDROCODONE-ACETAMINOPHEN 5-325 MG PO TABS
1.0000 | ORAL_TABLET | ORAL | Status: DC | PRN
  Administered 2020-07-13: 1 via ORAL
  Filled 2020-07-13: qty 1

## 2020-07-13 MED ORDER — PHENYLEPHRINE HCL-NACL 10-0.9 MG/250ML-% IV SOLN
INTRAVENOUS | Status: DC | PRN
  Administered 2020-07-13: 25 ug/min via INTRAVENOUS

## 2020-07-13 MED ORDER — MIDAZOLAM HCL 5 MG/5ML IJ SOLN
INTRAMUSCULAR | Status: DC | PRN
  Administered 2020-07-13: 2 mg via INTRAVENOUS

## 2020-07-13 MED ORDER — DEXAMETHASONE SODIUM PHOSPHATE 10 MG/ML IJ SOLN
INTRAMUSCULAR | Status: DC | PRN
  Administered 2020-07-13: 10 mg via INTRAVENOUS

## 2020-07-13 MED ORDER — FENTANYL CITRATE (PF) 100 MCG/2ML IJ SOLN
INTRAMUSCULAR | Status: DC | PRN
  Administered 2020-07-13: 100 ug via INTRAVENOUS

## 2020-07-13 MED ORDER — ROCURONIUM BROMIDE 10 MG/ML (PF) SYRINGE
PREFILLED_SYRINGE | INTRAVENOUS | Status: DC | PRN
  Administered 2020-07-13: 10 mg via INTRAVENOUS
  Administered 2020-07-13: 40 mg via INTRAVENOUS

## 2020-07-13 MED ORDER — SODIUM CHLORIDE 0.9 % IV SOLN: INTRAVENOUS | Status: DC

## 2020-07-13 MED ORDER — FENTANYL CITRATE (PF) 100 MCG/2ML IJ SOLN
INTRAMUSCULAR | Status: AC
  Filled 2020-07-13: qty 2

## 2020-07-13 MED ORDER — PHENYLEPHRINE 40 MCG/ML (10ML) SYRINGE FOR IV PUSH (FOR BLOOD PRESSURE SUPPORT)
PREFILLED_SYRINGE | INTRAVENOUS | Status: DC | PRN
  Administered 2020-07-13 (×3): 80 ug via INTRAVENOUS

## 2020-07-13 MED ORDER — ONDANSETRON HCL 4 MG/2ML IJ SOLN
INTRAMUSCULAR | Status: DC | PRN
  Administered 2020-07-13: 4 mg via INTRAVENOUS

## 2020-07-13 MED ORDER — LISINOPRIL 10 MG PO TABS
10.0000 mg | ORAL_TABLET | Freq: Every day | ORAL | Status: DC
  Administered 2020-07-14: 10 mg via ORAL
  Filled 2020-07-13: qty 1

## 2020-07-13 MED ORDER — LIDOCAINE HCL (PF) 1 % IJ SOLN
INTRAMUSCULAR | Status: DC | PRN
  Administered 2020-07-13: 60 mL

## 2020-07-13 SURGICAL SUPPLY — 5 items
CABLE SURGICAL S-101-97-12 (CABLE) ×3 IMPLANT
DEVICE DISSECT PLASMABLAD 3.0S (MISCELLANEOUS) IMPLANT
PAD PRO RADIOLUCENT 2001M-C (PAD) ×3 IMPLANT
PLASMABLADE 3.0S (MISCELLANEOUS) ×3
TRAY PACEMAKER INSERTION (PACKS) ×3 IMPLANT

## 2020-07-13 NOTE — Anesthesia Procedure Notes (Signed)
Procedure Name: Intubation Date/Time: 07/13/2020 3:07 PM Performed by: Candis Shine, CRNA Pre-anesthesia Checklist: Patient identified, Emergency Drugs available, Suction available and Patient being monitored Patient Re-evaluated:Patient Re-evaluated prior to induction Oxygen Delivery Method: Circle System Utilized Preoxygenation: Pre-oxygenation with 100% oxygen Induction Type: IV induction Ventilation: Mask ventilation without difficulty and Oral airway inserted - appropriate to patient size Laryngoscope Size: Mac and 4 Grade View: Grade I Tube type: Oral Tube size: 7.0 mm Number of attempts: 1 Airway Equipment and Method: Stylet and Oral airway Placement Confirmation: ETT inserted through vocal cords under direct vision,  positive ETCO2 and breath sounds checked- equal and bilateral Secured at: 22 cm Tube secured with: Tape Dental Injury: Teeth and Oropharynx as per pre-operative assessment

## 2020-07-13 NOTE — Anesthesia Preprocedure Evaluation (Addendum)
Anesthesia Evaluation  Patient identified by MRN, date of birth, ID band Patient awake    Reviewed: Allergy & Precautions, H&P , NPO status , Patient's Chart, lab work & pertinent test results, reviewed documented beta blocker date and time   History of Anesthesia Complications (+) PONV  Airway Mallampati: II  TM Distance: >3 FB Neck ROM: Full    Dental no notable dental hx. (+) Partial Upper, Dental Advisory Given   Pulmonary neg pulmonary ROS, former smoker,    Pulmonary exam normal breath sounds clear to auscultation       Cardiovascular hypertension, Pt. on medications and Pt. on home beta blockers + CAD (out of hospital cardiac arrest in 2013, 3v disease repaired.) and + Peripheral Vascular Disease  + Cardiac Defibrillator (originally place 2014; h/o infection requiring explantation)  Rhythm:Regular Rate:Normal  04/15/2019:  1. The left ventricle has moderate-severely reduced systolic function,  with an ejection fraction of 30-35%. The cavity size was normal. Left  ventricular diastolic Doppler parameters are consistent with  pseudonormalization. Elevated mean left atrial  pressure There is abnormal septal motion consistent with left bundle  branch block. Left ventricular diffuse hypokinesis.  2. The right ventricle has mildly reduced systolic function. The cavity  was normal. There is no increase in right ventricular wall thickness.  Right ventricular systolic pressure is mildly elevated with an estimated  pressure of 38.6 mmHg.  3. Left atrial size was moderately dilated.    Neuro/Psych  Headaches, Anxiety    GI/Hepatic Neg liver ROS, GERD  Medicated,  Endo/Other  negative endocrine ROS  Renal/GU negative Renal ROS  negative genitourinary   Musculoskeletal negative musculoskeletal ROS (+)   Abdominal   Peds  Hematology negative hematology ROS (+)   Anesthesia Other Findings ICD implant erosion through  axilla  Reproductive/Obstetrics negative OB ROS                          Anesthesia Physical Anesthesia Plan  ASA: III  Anesthesia Plan: General   Post-op Pain Management:    Induction: Intravenous  PONV Risk Score and Plan: 3 and Midazolam, Dexamethasone, Ondansetron and Treatment may vary due to age or medical condition  Airway Management Planned: Oral ETT  Additional Equipment:   Intra-op Plan:   Post-operative Plan: Extubation in OR  Informed Consent: I have reviewed the patients History and Physical, chart, labs and discussed the procedure including the risks, benefits and alternatives for the proposed anesthesia with the patient or authorized representative who has indicated his/her understanding and acceptance.     Dental advisory given  Plan Discussed with: CRNA  Anesthesia Plan Comments:        Anesthesia Quick Evaluation

## 2020-07-13 NOTE — Interval H&P Note (Signed)
History and Physical Interval Note:  07/13/2020 3:02 PM  Jonathon Ryan  has presented today for surgery, with the diagnosis of infection.  The various methods of treatment have been discussed with the patient and family. After consideration of risks, benefits and other options for treatment, the patient has consented to  Procedure(s): SUBQ ICD EXPLANT ([name]) as a surgical intervention.  The patient's history has been reviewed, patient examined, no change in status, stable for surgery.  I have reviewed the patient's chart and labs.  Questions were answered to the patient's satisfaction.     Samarah Hogle T Emmajo Bennette

## 2020-07-13 NOTE — Telephone Encounter (Signed)
     Tamika calling to give approval for CPT code 73428 with Berkley Harvey ID [id number] this is good for 07/13/2020 to 01/09/2021 to be performed in [facility] system. This is not a guaranteed payment. She will fax copy of approval today

## 2020-07-13 NOTE — Transfer of Care (Signed)
Immediate Anesthesia Transfer of Care Note  Patient: Jonathon Ryan  Procedure(s) Performed: SUBQ ICD EXPLANT ([name] )  Patient Location: Cath Lab  Anesthesia Type:General  Level of Consciousness: awake, alert  and oriented  Airway & Oxygen Therapy: Patient Spontanous Breathing and Patient connected to nasal cannula oxygen  Post-op Assessment: Report given to RN and Post -op Vital signs reviewed and stable  Post vital signs: Reviewed and stable  Last Vitals:  Vitals Value Taken Time  BP 131/43 07/13/20 1659  Temp 36.4 C 07/13/20 1659  Pulse 69 07/13/20 1703  Resp 19 07/13/20 1703  SpO2 100 % 07/13/20 1703  Vitals shown include unvalidated device data.  Last Pain:  Vitals:   07/13/20 1659  TempSrc: Temporal  PainSc: 0-No pain         Complications: No complications documented.

## 2020-07-13 NOTE — Progress Notes (Signed)
Notified by central tele that pt ST elevation was 3.7- pt is asymptomatic and I paged Cards on call with this information. I will continue to monitor pt closely.

## 2020-07-13 NOTE — Telephone Encounter (Signed)
Seems this may be just an FYI as pt has a procedure with Dr. Graciela Husbands today. Dr. Koren Bound nurse is not available today. I will close phone note.

## 2020-07-13 NOTE — Anesthesia Postprocedure Evaluation (Signed)
Anesthesia Post Note  Patient: Jonathon Ryan  Procedure(s) Performed: SUBQ ICD EXPLANT ([name] )     Patient location during evaluation: Cath Lab Anesthesia Type: General Level of consciousness: awake and alert Pain management: pain level controlled Vital Signs Assessment: post-procedure vital signs reviewed and stable Respiratory status: spontaneous breathing, nonlabored ventilation and respiratory function stable Cardiovascular status: blood pressure returned to baseline and stable Postop Assessment: no apparent nausea or vomiting Anesthetic complications: no   No complications documented.  Last Vitals:  Vitals:   07/13/20 1725 07/13/20 1745  BP: (!) 125/48 (!) 116/49  Pulse: 71   Resp: (!) 21   Temp:    SpO2: 98% 96%    Last Pain:  Vitals:   07/13/20 1725  TempSrc:   PainSc: 0-No pain                 Hatsue Sime,W. EDMOND

## 2020-07-14 ENCOUNTER — Encounter (HOSPITAL_COMMUNITY): Payer: Self-pay | Admitting: Cardiology

## 2020-07-14 ENCOUNTER — Observation Stay (HOSPITAL_COMMUNITY): Payer: Medicare HMO

## 2020-07-14 DIAGNOSIS — T827XXA Infection and inflammatory reaction due to other cardiac and vascular devices, implants and grafts, initial encounter: Secondary | ICD-10-CM

## 2020-07-14 DIAGNOSIS — I110 Hypertensive heart disease with heart failure: Secondary | ICD-10-CM | POA: Diagnosis not present

## 2020-07-14 DIAGNOSIS — I2510 Atherosclerotic heart disease of native coronary artery without angina pectoris: Secondary | ICD-10-CM | POA: Diagnosis not present

## 2020-07-14 DIAGNOSIS — I255 Ischemic cardiomyopathy: Secondary | ICD-10-CM | POA: Diagnosis not present

## 2020-07-14 MED ORDER — ASPIRIN 81 MG PO TABS: 81.0000 mg | ORAL_TABLET | Freq: Every day | ORAL | Status: AC

## 2020-07-14 MED ORDER — CLOPIDOGREL BISULFATE 75 MG PO TABS: 75.0000 mg | ORAL_TABLET | Freq: Every day | ORAL | Status: DC

## 2020-07-14 MED ORDER — TRAMADOL HCL 50 MG PO TABS: 50.0000 mg | ORAL_TABLET | Freq: Two times a day (BID) | ORAL | 0 refills | Status: AC | PRN

## 2020-07-14 MED ORDER — ACETAMINOPHEN 325 MG PO TABS: 325.0000 mg | ORAL_TABLET | ORAL | Status: DC | PRN

## 2020-07-14 MED FILL — Gentamicin Sulfate Inj 40 MG/ML: INTRAMUSCULAR | Qty: 80 | Status: AC

## 2020-07-14 MED FILL — Lidocaine HCl Local Inj 1%: INTRAMUSCULAR | Qty: 60 | Status: AC

## 2020-07-14 NOTE — Progress Notes (Signed)
PIV consult: Arrived to unit, pt off the floor. RN will re-enter consult when pt returns.

## 2020-07-14 NOTE — Discharge Summary (Signed)
ELECTROPHYSIOLOGY DISCHARGE SUMMARY    Patient ID: Jonathon Ryan,  MRN: [id number], DOB/AGE: 07/03/1956 64 y.o.  Admit date: 07/13/2020 Discharge date: 07/14/2020  Primary Care Physician: Dettinger, Elige Radon, MD  Primary Cardiologist: No primary care provider on file.  Electrophysiologist: [name]  Primary Discharge Diagnosis:  Infection involving ICD   Secondary Discharge Diagnosis:  Ischemic cardiomyopathy CAD Chronic systolic CHF  Procedures This Admission:  SubQ ICD extraction which demonstrated: 1. Successful removal of a subcutaneous ICD 2. Full capsulectomy 3.  No early apparent complications.   Brief HPI: Jonathon Ryan is a 64 y.o. male with a history of ICM. He was seen in clinic 07/12/2020 with reports of skin darkening and eventual erosion of his ICD through the left axillary incision. He was admitted for planned ICD extraction.  Hospital Course:  The patient was admitted for planned procedure as above  They were monitored on telemetry overnight which demonstrated stable rhythm.   The patient was examined and considered to be stable for discharge.  Wound care and restrictions were reviewed with the patient. The patient will be seen back by Dr. Lalla Brothers in 1 week for post hospital care.   He will resume his ASA and Plavix on Tuesday, 10/12 and Dr. Lalla Brothers will discuss re-implantation at additional follow up.   Physical Exam: Vitals:   07/13/20 1745 07/13/20 2036 07/14/20 0051 07/14/20 0537  BP: (!) 116/49 124/60 133/60 (!) 115/51  Pulse: 71 86 64 76  Resp: 20 19 20 18   Temp: 98.5 F (36.9 C) 98.3 F (36.8 C) 97.8 F (36.6 C) 97.8 F (36.6 C)  TempSrc: Oral Oral Oral Oral  SpO2: 96% 98% 97% 97%  Weight:    69.3 kg  Height:        GEN- The patient is well appearing, alert and oriented x 3 today.   HEENT: normocephalic, atraumatic; sclera clear, conjunctiva pink; hearing intact; oropharynx clear; neck supple  Lungs- Clear to ausculation bilaterally,  normal work of breathing.  No wheezes, rales, rhonchi Heart- Regular rate and rhythm, no murmurs, rubs or gallops  GI- soft, non-tender, non-distended, bowel sounds present  Extremities- no clubbing, cyanosis, or edema; DP/PT/radial pulses 2+ bilaterally, groin without hematoma/bruit MS- no significant deformity or atrophy Skin- warm and dry, no rash or lesion Psych- euthymic mood, full affect Neuro- strength and sensation are intact   Labs:   Lab Results  Component Value Date   WBC 7.6 07/12/2020   HGB 14.0 07/12/2020   HCT 41.1 07/12/2020   MCV 85 07/12/2020   PLT 374 07/12/2020    Recent Labs  Lab 07/12/20 1319  NA 137  K 4.2  CL 103  CO2 24  BUN 18  CREATININE 0.89  CALCIUM 10.2  GLUCOSE 89     Discharge Medications:  Allergies as of 07/14/2020   No Known Allergies     Medication List    TAKE these medications   acetaminophen 325 MG tablet Commonly known as: TYLENOL Take 1-2 tablets (325-650 mg total) by mouth every 4 (four) hours as needed for mild pain.   aspirin 81 MG tablet Take 1 tablet (81 mg total) by mouth daily. Start taking on: July 19, 2020 What changed: These instructions start on July 19, 2020. If you are unsure what to do until then, ask your doctor or other care provider.   carvedilol 12.5 MG tablet Commonly known as: COREG Take 25 mg by mouth 2 (two) times daily with a meal.  clopidogrel 75 MG tablet Commonly known as: PLAVIX Take 1 tablet (75 mg total) by mouth daily. Start taking on: July 19, 2020 What changed: These instructions start on July 19, 2020. If you are unsure what to do until then, ask your doctor or other care provider.   doxycycline 100 MG tablet Commonly known as: VIBRA-TABS Take 100 mg by mouth 2 (two) times daily.   fenofibrate 145 MG tablet Commonly known as: TRICOR Take 1 tablet by mouth once daily   lisinopril 10 MG tablet Commonly known as: ZESTRIL Take 1 tablet by mouth once daily     mirtazapine 30 MG tablet Commonly known as: REMERON Take 30 mg by mouth at bedtime.   omeprazole 40 MG capsule Commonly known as: PRILOSEC Take 1 capsule (40 mg total) by mouth daily.   rosuvastatin 40 MG tablet Commonly known as: CRESTOR Take 1 tablet (40 mg total) by mouth daily.   traMADol 50 MG tablet Commonly known as: Ultram Take 1 tablet (50 mg total) by mouth every 12 (twelve) hours as needed for up to 5 days for moderate pain or severe pain.       Disposition:    Follow-up Information    Lanier Prude, MD Follow up on 07/21/2020.   Specialty: Cardiology Why: at 1045 for post ICD extraction check Contact information: [address] [place] [address] [phone]               Duration of Discharge Encounter: Greater than 30 minutes including physician time.  Dustin Flock, PA-C  07/14/2020 9:04 AM

## 2020-07-14 NOTE — Discharge Instructions (Signed)
Implantable Cardiac Device Extraction, Care After  This sheet gives you information about how to care for yourself after your procedure. Your health care provider may also give you more specific instructions. If you have problems or questions, contact your health care provider.  What can I expect after the procedure? After your procedure, it is common to have:  Pain or soreness at the site where the cardiac device was removed.  Mild Swelling at the site where the cardiac device was inserted.   Follow these instructions at home: Incision care   Keep the incision clean and dry. ? Do not take baths, swim, or use a hot tub until after your wound check.  ? Do not shower for at least 7 days, or as directed by your health care provider. ? Pat the area dry with a clean towel or air dry. Do not rub the area. This may cause bleeding.  Follow instructions from your health care provider about how to take care of your incision. Make sure you: ? Leave stitches (sutures), skin glue, or adhesive strips in place. These skin closures may need to stay in place for 2 weeks or longer. If adhesive strip edges start to loosen and curl up, you may trim the loose edges. Do not remove adhesive strips completely unless your health care provider tells you to do that.  Check around your incision area every day for signs of infection. Check for: ? More redness, swelling, or pain. ? More fluid or blood. ? Warmth. ? Pus or a bad smell. Activity  Do not lift anything that is heavier than 10 lb (4.5 kg) until your health care provider says it is okay to do so.  For the first week, or as long as told by your health care provider: ? Avoid lifting your affected arm higher than your shoulder. ? Avoid strenuous exercise.  Ask your health care provider when it is okay to: ? Resume your normal activities. ? Return to work or school. ? Resume sexual activity. Contact a health care provider if:  You have any of these  around your incision site or coming from it: ? More redness, swelling, or pain. ? Fluid or blood. ? Warmth to the touch. ? Pus or a bad smell.  You have a fever. Get help right away if:  You experience chest pain that is different from the pain at the cardiac device site.  You develop a red streak that extends above or below the incision site.  You experience shortness of breath.  You have light-headedness that does not go away quickly.  You faint or have dizzy spells.  Your pulse suddenly drops or increases rapidly and does not return to normal.  You begin to gain weight and your legs and ankles swell. Summary  After your procedure, it is common to have pain, soreness, and some swelling where the cardiac device was removed.  Make sure to keep your incision clean and dry. Follow instructions from your health care provider about how to take care of your incision.  Check your incision every day for signs of infection, such as more pain or swelling, pus or a bad smell, warmth, or leaking fluid and blood.  Avoid strenuous exercise and lifting your left arm higher than your shoulder for 2 weeks, or as long as told by your health care provider. This information is not intended to replace advice given to you by your health care provider. Make sure you discuss any questions you have  with your health care provider.  

## 2020-07-15 ENCOUNTER — Telehealth: Payer: Self-pay | Admitting: Cardiology

## 2020-07-15 NOTE — Telephone Encounter (Signed)
Jonathon Ryan is calling with an urgent lab result. Please advise.

## 2020-07-15 NOTE — Telephone Encounter (Signed)
Spoke with Camelia from [employer] who reports pt's tissue sample from implant is growing rare staph epidemidis.  Final report should be ready 07/17/2020.  Dr Lalla Brothers notified of preliminary results.

## 2020-07-18 LAB — AEROBIC/ANAEROBIC CULTURE W GRAM STAIN (SURGICAL/DEEP WOUND)

## 2020-07-21 ENCOUNTER — Other Ambulatory Visit: Payer: Self-pay

## 2020-07-21 ENCOUNTER — Ambulatory Visit: Payer: Medicare HMO | Admitting: Cardiology

## 2020-07-21 ENCOUNTER — Encounter: Payer: Self-pay | Admitting: Cardiology

## 2020-07-21 VITALS — BP 120/72 | HR 78 | Ht 70.0 in | Wt 160.0 lb

## 2020-07-21 DIAGNOSIS — I255 Ischemic cardiomyopathy: Secondary | ICD-10-CM | POA: Diagnosis not present

## 2020-07-21 DIAGNOSIS — T827XXD Infection and inflammatory reaction due to other cardiac and vascular devices, implants and grafts, subsequent encounter: Secondary | ICD-10-CM | POA: Diagnosis not present

## 2020-07-21 DIAGNOSIS — T827XXA Infection and inflammatory reaction due to other cardiac and vascular devices, implants and grafts, initial encounter: Secondary | ICD-10-CM

## 2020-07-21 MED ORDER — CEPHALEXIN 500 MG PO CAPS: 500.0000 mg | ORAL_CAPSULE | Freq: Four times a day (QID) | ORAL | 0 refills | Status: AC

## 2020-07-21 NOTE — Progress Notes (Signed)
Electrophysiology Office Follow up Visit Note:    Date:  07/21/2020   ID:  AMBERS IYENGAR, DOB 03-Apr-1956, MRN [id number]  PCP:  Dettinger, Elige Radon, MD  [facility] Cardiologist:  No primary care provider on file.  [facility] Electrophysiologist:  Lewayne Bunting, MD    Interval History:    Jonathon Ryan is a 64 y.o. male who presents for a follow up visit. They were last seen in the hospital on October 6 for a subcu ICD removal.  There is extensive debridement of the left mid axillary line with a full capsulectomy.  He has been doing well since leaving the hospital.  Incisions appear to be well-healed without any drainage.  There is some swelling along the left mid axillary pocket area that has been present since he left the hospital.  No fevers or chills since leaving the hospital.    Past Medical History:  Diagnosis Date   Anxiety    CAD (coronary artery disease) 9/13   s/p CABG   Cardiac arrest due to underlying cardiac condition (HCC)    successfully rescucitated/ cooled   Cardiac defibrillator in place    Cerebrovascular disease 10/15/2012   Chronic systolic dysfunction of left ventricle    GERD (gastroesophageal reflux disease)    Headache(784.0)    Hyperlipidemia    Hypertension    Infection of pacemaker pocket [facility])    of ICD with subsequent explant   Ischemic cardiomyopathy    s/p BiV ICD implant Feb 2014 - [address] [name] [place], model 0160FU-93 (serial # [id number]) right atrial lead and a [name] [place], model 2542-70W (serial number [id number]) right ventricular defibrillator lead. [name] Quartet model [phone] 86 (serial number [id number]) LV lead. [name] Quadra [place] model [place] 0737 [phone] (serial Number [id number]) biventricular ICD.    Left bundle branch block    Peripheral arterial disease (HCC)    PONV (postoperative nausea and vomiting)    Renal artery stenosis (HCC)    Shortness of breath dyspnea    with exertion    Spinal headache     Past Surgical History:  Procedure Laterality Date   BI-VENTRICULAR IMPLANTABLE CARDIOVERTER DEFIBRILLATOR [name] 11/25/2012   Procedure: BI-VENTRICULAR IMPLANTABLE CARDIOVERTER DEFIBRILLATOR  (CRT-D);  Surgeon: Hillis Range, MD;  Location: [facility] CATH LAB;  Service: Cardiovascular;  Laterality: [name];   CAROTID ANGIOGRAM Bilateral 10/14/2012   Procedure: CAROTID ANGIOGRAM;  Surgeon: Nada Libman, MD;  Location: [facility] CATH LAB;  Service: Cardiovascular;  Laterality: Bilateral;   CAROTID STENT INSERTION [name] 10/14/2012   Procedure: CAROTID STENT INSERTION;  Surgeon: Nada Libman, MD;  Location[facility] CATH LAB;  Service: Cardiovascular;  Laterality: [name];   COLONOSCOPY [name] 08/18/2015   Procedure: COLONOSCOPY;  Surgeon: Malissa Hippo, MD;  [facility];  Service: Endoscopy;  Laterality: [name];  830   CORONARY ARTERY BYPASS GRAFT  06/11/2012   Procedure: CORONARY ARTERY BYPASS GRAFTING (CABG);  Surgeon: [name], MD;  Location: [facility] OR;  Service: Open Heart Surgery;  Laterality: [name];  Coronary Artery Bypass Grafting times four using left internal mammary artery and right greater saphenous vein endoscopically harvested.   EP IMPLANTABLE DEVICE [name] 03/24/2015   Procedure: SubQ ICD Implant;  Surgeon: Marinus Maw, MD;  Location: [facility] INVASIVE CV LAB;  Service: Cardiovascular;  Laterality: [name];   ICD LEAD REMOVAL Left 10/04/2014   Procedure: REMOVAL Saint Jude ICD with lead;  Surgeon: Marinus Maw, MD;  Location: [facility]  OR;  Service: Cardiovascular;  Laterality: Left;  [name] [facility]   LEFT HEART CATHETERIZATION WITH CORONARY ANGIOGRAM [name] 06/11/2012   Procedure: LEFT HEART CATHETERIZATION WITH CORONARY ANGIOGRAM;  Surgeon: [name], MD;  [facility];  Service: Cardiovascular;  Laterality: [name];   PERCUTANEOUS PLACEMENT INTRAVASCULAR STENT CERVICAL CAROTID ARTERY     SUBQ ICD CHANGEOUT [name] 07/13/2020   Procedure: SUBQ ICD EXPLANT;  Surgeon: Lanier PrudeLambert,  Metta Koranda T, MD;  [facility];  Service: Cardiovascular;  Laterality: [name];    Current Medications: Current Meds  Medication Sig   acetaminophen (TYLENOL) 325 MG tablet Take 1-2 tablets (325-650 mg total) by mouth every 4 (four) hours as needed for mild pain.   aspirin 81 MG tablet Take 1 tablet (81 mg total) by mouth daily.   carvedilol (COREG) 12.5 MG tablet Take 25 mg by mouth 2 (two) times daily with a meal.    clopidogrel (PLAVIX) 75 MG tablet Take 1 tablet (75 mg total) by mouth daily.   doxycycline (VIBRA-TABS) 100 MG tablet Take 100 mg by mouth 2 (two) times daily.   fenofibrate (TRICOR) 145 MG tablet Take 1 tablet by mouth once daily (Patient taking differently: Take 145 mg by mouth daily. )   lisinopril (ZESTRIL) 10 MG tablet Take 1 tablet by mouth once daily (Patient taking differently: Take 10 mg by mouth daily. )   mirtazapine (REMERON) 30 MG tablet Take 30 mg by mouth at bedtime.   omeprazole (PRILOSEC) 40 MG capsule Take 1 capsule (40 mg total) by mouth daily.   rosuvastatin (CRESTOR) 40 MG tablet Take 1 tablet (40 mg total) by mouth daily.     Allergies:   Patient has no known allergies.   Social History   Socioeconomic History   Marital status: Married    Spouse name: Sumter LionsLois   Number of children: 2   Years of education: 10   Highest education level: GED or equivalent  Occupational History   Occupation: disability  Tobacco Use   Smoking status: Former Smoker    Packs/day: 1.50    Years: 40.00    Pack years: 60.00    Types: Cigarettes    Quit date: 06/11/2012    Years since quitting: 8.1   Smokeless tobacco: Never Used  Vaping Use   Vaping Use: Never used  Substance and Sexual Activity   Alcohol use: Yes    Alcohol/week: 14.0 standard drinks    Types: 14 Cans of beer per week   Drug use: Yes    Frequency: 2.0 times per week    Types: Marijuana    Comment: 2 per week   Sexual activity: Yes    Comment: married and 2  step-daughters  Other Topics Concern   Not on file  Social History Narrative   Not on file   Social Determinants of Health   Financial Resource Strain: Low Risk    Difficulty of Paying Living Expenses: Not hard at all  Food Insecurity: No Food Insecurity   Worried About Programme researcher, broadcasting/film/videounning Out of Food in the Last Year: Never true   Baristaan Out of Food in the Last Year: Never true  Transportation Needs: No Transportation Needs   Lack of Transportation (Medical): No   Lack of Transportation (Non-Medical): No  Physical Activity: Sufficiently Active   Days of Exercise per Week: 5 days   Minutes of Exercise per Session: 30 min  Stress: No Stress Concern Present   Feeling of Stress :  Not at all  Social Connections: Moderately Isolated   Frequency of Communication with Friends and Family: More than three times a week   Frequency of Social Gatherings with Friends and Family: More than three times a week   Attends Religious Services: Never   Database administrator or Organizations: No   Attends Banker Meetings: Never   Marital Status: Married     Family History: The patient's family history includes Cancer in his half-brother and half-brother.  ROS:   Please see the history of present illness.    All other systems reviewed and are negative.  EKGs/Labs/Other Studies Reviewed:    The following studies were reviewed today: n/a  EKG:  The ekg ordered today demonstrates sinus rhythm with a left bundle branch block pattern.  Recent Labs: 12/21/2019: ALT 19 07/12/2020: BUN 18; Creatinine, Ser 0.89; Hemoglobin 14.0; Platelets 374; Potassium 4.2; Sodium 137  Recent Lipid Panel    Component Value Date/Time   CHOL 153 12/21/2019 0830   TRIG 265 (H) 12/21/2019 0830   HDL 31 (L) 12/21/2019 0830   CHOLHDL 4.9 12/21/2019 0830   CHOLHDL 6.0 01/06/2014 1342   VLDL 71 (H) 01/06/2014 1342   LDLCALC 78 12/21/2019 0830    Physical Exam:    VS:  BP 120/72    Pulse 78    Ht  5\' 10"  (1.778 m)    Wt 160 lb (72.6 kg)    SpO2 96%    BMI 22.96 kg/m     Wt Readings from Last 3 Encounters:  07/21/20 160 lb (72.6 kg)  07/14/20 152 lb 11.2 oz (69.3 kg)  07/12/20 159 lb 6.4 oz (72.3 kg)     GEN: Well nourished, well developed in no acute distress HEENT: Normal NECK: No JVD; No carotid bruits LYMPHATICS: No lymphadenopathy CARDIAC: RRR, no murmurs, rubs, gallops.  The suprasternal incision appears to be well-healed with some mild erythema surrounding the sutures that looks like irritation from his shirt.  The subxiphoid incision appears to be well-healed with minimal erythema at the suture knots.  The left mid axillary incision is well-healed without drainage.  There is minimal erythema at the suture sites.  The previous pocket is mildly swollen but nontender and not erythematous. RESPIRATORY:  Clear to auscultation without rales, wheezing or rhonchi  ABDOMEN: Soft, non-tender, non-distended MUSCULOSKELETAL:  No edema; No deformity  SKIN: Warm and dry NEUROLOGIC:  Alert and oriented x 3 PSYCHIATRIC:  Normal affect   ASSESSMENT:    1. Ischemic cardiomyopathy   2. Infection involving implantable cardioverter-defibrillator (ICD), initial encounter (HCC)    PLAN:    In order of problems listed above:  1.  Ischemic cardiomyopathy with S ICD complicated by ICD infection and erosion Now healing from S ICD extraction.  Extensive debridement of the pocket was performed in the procedure.  His incisions appear to be well-healing.  There is some mild swelling at the left axillary pocket location without overlying erythema.  No pain in this area.  No drainage.  Plan to see him back in device clinic next week for suture removal.  Given the minimal amount of erythema at the suture sites plan to cover him with Keflex 4 times a day for 10 days.  Patient plans to discuss reimplant strategy with Dr. 09/11/20 in clinic.  For now, the patient has declined wearable ICD.   Medication  Adjustments/Labs and Tests Ordered: Current medicines are reviewed at length with the patient today.  Concerns regarding medicines are  outlined above.  Orders Placed This Encounter  Procedures   EKG 12-Lead   Meds ordered this encounter  Medications   cephALEXin (KEFLEX) 500 MG capsule    Sig: Take 1 capsule (500 mg total) by mouth 4 (four) times daily for 10 days.    Dispense:  40 capsule    Refill:  0     Signed, Steffanie Dunn, MD, [facility]  07/21/2020 11:22 AM    Electrophysiology [facility]

## 2020-07-21 NOTE — Patient Instructions (Addendum)
Medication Instructions:  Your physician has recommended you make the following change in your medication:   1.  Take KEFLEX 500 mg- one tablet by mouth 4 times a day for 10 days.  Lab Work: None ordered. If you have labs (blood work) drawn today and your tests are completely normal, you will receive your results only by:  MyChart Message (if you have MyChart) OR  A paper copy in the mail If you have any lab test that is abnormal or we need to change your treatment, we will call you to review the results.  Testing/Procedures: None ordered.  Follow-Up: At [facility], you and your health needs are our priority.  As part of our continuing mission to provide you with exceptional heart care, we have created designated Provider Care Teams.  These Care Teams include your primary Cardiologist (physician) and Advanced Practice Providers (APPs -  Physician Assistants and Nurse Practitioners) who all work together to provide you with the care you need, when you need it.  Your next appointment:   Your physician wants you to follow-up in: 4-6 weeks with [name] in [place].    August 26, 2020 at 11:15 am at the [place] office.

## 2020-07-26 ENCOUNTER — Other Ambulatory Visit: Payer: Self-pay

## 2020-07-26 ENCOUNTER — Ambulatory Visit (INDEPENDENT_AMBULATORY_CARE_PROVIDER_SITE_OTHER): Payer: Medicare HMO | Admitting: Emergency Medicine

## 2020-07-26 DIAGNOSIS — L089 Local infection of the skin and subcutaneous tissue, unspecified: Secondary | ICD-10-CM

## 2020-07-26 DIAGNOSIS — T148XXA Other injury of unspecified body region, initial encounter: Secondary | ICD-10-CM

## 2020-07-26 NOTE — Progress Notes (Signed)
Sutures removed from all 3 wound sites. Sternal wound site without edema or drainage, slight redness. Abdominal wound site without redness, drainage or edema. Left axillary wound red, edematous , small amount or serosanguinous drainage., area is not hot to touch. Dr Elberta Fortis in to evaluate wound site, patient to finish Keflex and to be seen in 1 week when DR Ladona Ridgel is in clinic.Education done on s/sx of infection and instructed to seek treatment immediately if he develops a fever or chills.

## 2020-07-26 NOTE — Patient Instructions (Signed)
Call the device clinic if you develop increased swelling or drainage at wound site. Finish all antibiotics. Call the device clinic if you develop a fever or chills or seek treatment in ED or Urgent care center if fever or chills occur after hours or on the weekend.  Device Clinic: [phone]

## 2020-08-02 ENCOUNTER — Other Ambulatory Visit: Payer: Self-pay

## 2020-08-02 ENCOUNTER — Ambulatory Visit (INDEPENDENT_AMBULATORY_CARE_PROVIDER_SITE_OTHER): Payer: Medicare HMO | Admitting: Emergency Medicine

## 2020-08-02 DIAGNOSIS — I255 Ischemic cardiomyopathy: Secondary | ICD-10-CM

## 2020-08-02 NOTE — Patient Instructions (Signed)
Please call if you see increased swelling, any drainage or pain. Device Clinic number [phone].

## 2020-08-02 NOTE — Progress Notes (Signed)
Patient seen today in Device Clinic for wound check follow-up. Swelling noted, patient reports improvement. No redness or drainage noted to site. Swelling is soft to palpation. [name] made aware and states patient can use ice pack 10-15 minutes at a time 3 times a day. Call device clinic if swelling increases, drainage/redness or pain occurs. Patient education completed.

## 2020-08-03 ENCOUNTER — Encounter (HOSPITAL_COMMUNITY): Payer: Self-pay | Admitting: Cardiology

## 2020-08-11 ENCOUNTER — Ambulatory Visit (INDEPENDENT_AMBULATORY_CARE_PROVIDER_SITE_OTHER): Payer: Medicare HMO | Admitting: Family Medicine

## 2020-08-11 ENCOUNTER — Encounter: Payer: Self-pay | Admitting: Family Medicine

## 2020-08-11 ENCOUNTER — Other Ambulatory Visit: Payer: Self-pay

## 2020-08-11 VITALS — BP 105/60 | HR 57 | Temp 97.5°F | Ht 70.0 in | Wt 156.6 lb

## 2020-08-11 DIAGNOSIS — F5101 Primary insomnia: Secondary | ICD-10-CM | POA: Diagnosis not present

## 2020-08-11 DIAGNOSIS — I10 Essential (primary) hypertension: Secondary | ICD-10-CM | POA: Diagnosis not present

## 2020-08-11 DIAGNOSIS — K219 Gastro-esophageal reflux disease without esophagitis: Secondary | ICD-10-CM | POA: Diagnosis not present

## 2020-08-11 DIAGNOSIS — E782 Mixed hyperlipidemia: Secondary | ICD-10-CM | POA: Diagnosis not present

## 2020-08-11 MED ORDER — ROSUVASTATIN CALCIUM 40 MG PO TABS
40.0000 mg | ORAL_TABLET | Freq: Every day | ORAL | 3 refills | Status: DC
Start: 2020-08-11 — End: 2021-05-15

## 2020-08-11 MED ORDER — MIRTAZAPINE 30 MG PO TABS
30.0000 mg | ORAL_TABLET | Freq: Every day | ORAL | 3 refills | Status: DC
Start: 2020-08-11 — End: 2020-08-11

## 2020-08-11 MED ORDER — OMEPRAZOLE 40 MG PO CPDR
40.0000 mg | DELAYED_RELEASE_CAPSULE | Freq: Every day | ORAL | 3 refills | Status: DC
Start: 2020-08-11 — End: 2021-05-15

## 2020-08-11 MED ORDER — OMEPRAZOLE 40 MG PO CPDR
40.0000 mg | DELAYED_RELEASE_CAPSULE | Freq: Every day | ORAL | 3 refills | Status: DC
Start: 2020-08-11 — End: 2020-08-11

## 2020-08-11 MED ORDER — ROSUVASTATIN CALCIUM 40 MG PO TABS
40.0000 mg | ORAL_TABLET | Freq: Every day | ORAL | 3 refills | Status: DC
Start: 2020-08-11 — End: 2020-08-11

## 2020-08-11 MED ORDER — FENOFIBRATE 145 MG PO TABS
145.0000 mg | ORAL_TABLET | Freq: Every day | ORAL | 3 refills | Status: DC
Start: 2020-08-11 — End: 2021-05-15

## 2020-08-11 MED ORDER — MIRTAZAPINE 30 MG PO TABS
30.0000 mg | ORAL_TABLET | Freq: Every day | ORAL | 3 refills | Status: DC
Start: 2020-08-11 — End: 2021-05-15

## 2020-08-11 NOTE — Progress Notes (Signed)
BP 105/60   Pulse (!) 57   Temp (!) 97.5 F (36.4 C) (Temporal)   Ht _0  (1.778 m)   Wt 156 lb 9.6 oz (71 kg)   BMI 22.47 kg/m    Subjective:   Patient ID: Jonathon Ryan, male    DOB: 20-Mar-1956, 64 y.o.   MRN: [id number]  HPI: Jonathon Ryan is a 64 y.o. male presenting on 08/11/2020 for Hypertension (6 mos ckup), Hyperlipidemia, and Gastroesophageal Reflux   HPI Hypertension Patient is currently on lisinopril and carvedilol, and their blood pressure today is 105/60.  Patient has the occasional orthostatic dizziness but denies it being significant. Patient denies headaches, blurred vision, chest pains, shortness of breath, or weakness. Denies any side effects from medication and is content with current medication.   Hyperlipidemia Patient is coming in for recheck of his hyperlipidemia. The patient is currently taking fenofibrate and Crestor. They deny any issues with myalgias or history of liver damage from it. They deny any focal numbness or weakness or chest pain.   GERD Patient is currently on omeprazole.  She denies any major symptoms or abdominal pain or belching or burping. She denies any blood in her stool or lightheadedness or dizziness.   Insomnia Patient currently takes Remeron for insomnia.  Feels like it works very well for him, he does not use it every day and only has to use it some of the time but feels like is doing very well.  Patient was to take a look at the incisions on his chest, has been open.  The one on the left side has a little bit of pain developing around the lower aspect on the left side of his chest, recommended antibiotic cream twice a day.  Relevant past medical, surgical, family and social history reviewed and updated as indicated. Interim medical history since our last visit reviewed. Allergies and medications reviewed and updated.  Review of Systems  Constitutional: Negative for chills and fever.  Eyes: Negative for visual disturbance.    Respiratory: Negative for shortness of breath and wheezing.   Cardiovascular: Negative for chest pain and leg swelling.  Musculoskeletal: Negative for back pain and gait problem.  Skin: Negative for rash.  Neurological: Positive for dizziness. Negative for weakness, light-headedness and numbness.  All other systems reviewed and are negative.   Per HPI unless specifically indicated above   Allergies as of 08/11/2020   No Known Allergies     Medication List       Accurate as of August 11, 2020 11:03 AM. If you have any questions, ask your nurse or doctor.        STOP taking these medications   doxycycline 100 MG tablet Commonly known as: VIBRA-TABS Stopped by: Fransisca Kaufmann Caliann Leckrone, MD     TAKE these medications   acetaminophen 325 MG tablet Commonly known as: TYLENOL Take 1-2 tablets (325-650 mg total) by mouth every 4 (four) hours as needed for mild pain.   aspirin 81 MG tablet Take 1 tablet (81 mg total) by mouth daily.   carvedilol 12.5 MG tablet Commonly known as: COREG Take 25 mg by mouth 2 (two) times daily with a meal.   clopidogrel 75 MG tablet Commonly known as: PLAVIX Take 1 tablet (75 mg total) by mouth daily.   fenofibrate 145 MG tablet Commonly known as: TRICOR Take 1 tablet by mouth once daily   lisinopril 10 MG tablet Commonly known as: ZESTRIL Take 1 tablet by mouth once daily  mirtazapine 30 MG tablet Commonly known as: REMERON Take 30 mg by mouth at bedtime.   omeprazole 40 MG capsule Commonly known as: PRILOSEC Take 1 capsule (40 mg total) by mouth daily.   rosuvastatin 40 MG tablet Commonly known as: CRESTOR Take 1 tablet (40 mg total) by mouth daily.        Objective:   BP 105/60   Pulse (!) 57   Temp (!) 97.5 F (36.4 C) (Temporal)   Ht _0  (1.778 m)   Wt 156 lb 9.6 oz (71 kg)   BMI 22.47 kg/m   Wt Readings from Last 3 Encounters:  08/11/20 156 lb 9.6 oz (71 kg)  07/21/20 160 lb (72.6 kg)  07/14/20 152 lb 11.2 oz  (69.3 kg)    Physical Exam Vitals and nursing note reviewed.  Constitutional:      General: He is not in acute distress.    Appearance: He is well-developed. He is not diaphoretic.  Eyes:     General: No scleral icterus.    Conjunctiva/sclera: Conjunctivae normal.  Neck:     Thyroid: No thyromegaly.  Cardiovascular:     Rate and Rhythm: Normal rate and regular rhythm.     Heart sounds: Normal heart sounds. No murmur heard.   Pulmonary:     Effort: Pulmonary effort is normal. No respiratory distress.     Breath sounds: Normal breath sounds. No wheezing.  Musculoskeletal:        General: Normal range of motion.     Cervical back: Neck supple.  Lymphadenopathy:     Cervical: No cervical adenopathy.  Skin:    General: Skin is warm and dry.     Findings: No rash.  Neurological:     Mental Status: He is alert and oriented to person, place, and time.     Coordination: Coordination normal.  Psychiatric:        Behavior: Behavior normal.       Assessment & Plan:   Problem List Items Addressed This Visit      Cardiovascular and Mediastinum   Hypertension   Relevant Medications   fenofibrate (TRICOR) 145 MG tablet   rosuvastatin (CRESTOR) 40 MG tablet     Digestive   GERD (gastroesophageal reflux disease)   Relevant Medications   omeprazole (PRILOSEC) 40 MG capsule     Other   HLD (hyperlipidemia)   Relevant Medications   fenofibrate (TRICOR) 145 MG tablet   rosuvastatin (CRESTOR) 40 MG tablet   Other Relevant Orders   Lipid panel   CMP14+EGFR   CBC with Differential/Platelet    Other Visit Diagnoses    Essential hypertension    -  Primary   Relevant Medications   fenofibrate (TRICOR) 145 MG tablet   rosuvastatin (CRESTOR) 40 MG tablet   Other Relevant Orders   Lipid panel   CMP14+EGFR   CBC with Differential/Platelet   Primary insomnia        continue current medication   Follow up plan: Return in about 3 months (around 11/11/2020), or if symptoms  worsen or fail to improve, for htn and gerd.  Counseling provided for all of the vaccine components Orders Placed This Encounter  Procedures  . Lipid panel  . CMP14+EGFR  . CBC with Differential/Platelet    Caryl Pina, MD [facility] 08/11/2020, 11:03 AM

## 2020-08-12 LAB — CBC WITH DIFFERENTIAL/PLATELET
Basophils Absolute: 0 10*3/uL (ref 0.0–0.2)
Basos: 1 %
EOS (ABSOLUTE): 0.2 10*3/uL (ref 0.0–0.4)
Eos: 2 %
Hematocrit: 43.2 % (ref 37.5–51.0)
Hemoglobin: 14.2 g/dL (ref 13.0–17.7)
Immature Grans (Abs): 0 10*3/uL (ref 0.0–0.1)
Immature Granulocytes: 1 %
Lymphocytes Absolute: 1.4 10*3/uL (ref 0.7–3.1)
Lymphs: 18 %
MCH: 28.3 pg (ref 26.6–33.0)
MCHC: 32.9 g/dL (ref 31.5–35.7)
MCV: 86 fL (ref 79–97)
Monocytes Absolute: 0.6 10*3/uL (ref 0.1–0.9)
Monocytes: 7 %
Neutrophils Absolute: 5.7 10*3/uL (ref 1.4–7.0)
Neutrophils: 71 %
Platelets: 406 10*3/uL (ref 150–450)
RBC: 5.02 x10E6/uL (ref 4.14–5.80)
RDW: 12.4 % (ref 11.6–15.4)
WBC: 7.9 10*3/uL (ref 3.4–10.8)

## 2020-08-12 LAB — CMP14+EGFR
ALT: 22 IU/L (ref 0–44)
AST: 28 IU/L (ref 0–40)
Albumin/Globulin Ratio: 1.9 (ref 1.2–2.2)
Albumin: 5.2 g/dL — ABNORMAL HIGH (ref 3.8–4.8)
Alkaline Phosphatase: 65 IU/L (ref 44–121)
BUN/Creatinine Ratio: 14 (ref 10–24)
BUN: 15 mg/dL (ref 8–27)
Bilirubin Total: 0.4 mg/dL (ref 0.0–1.2)
CO2: 24 mmol/L (ref 20–29)
Calcium: 10.5 mg/dL — ABNORMAL HIGH (ref 8.6–10.2)
Chloride: 101 mmol/L (ref 96–106)
Creatinine, Ser: 1.07 mg/dL (ref 0.76–1.27)
GFR calc Af Amer: 84 mL/min/{1.73_m2} (ref >59)
GFR calc non Af Amer: 72 mL/min/{1.73_m2} (ref >59)
Globulin, Total: 2.8 g/dL (ref 1.5–4.5)
Glucose: 85 mg/dL (ref 65–99)
Potassium: 5.5 mmol/L — ABNORMAL HIGH (ref 3.5–5.2)
Sodium: 138 mmol/L (ref 134–144)
Total Protein: 8 g/dL (ref 6.0–8.5)

## 2020-08-12 LAB — LIPID PANEL
Chol/HDL Ratio: 7.2 ratio — ABNORMAL HIGH (ref 0.0–5.0)
Cholesterol, Total: 209 mg/dL — ABNORMAL HIGH (ref 100–199)
HDL: 29 mg/dL — ABNORMAL LOW (ref >39)
LDL Chol Calc (NIH): 126 mg/dL — ABNORMAL HIGH (ref 0–99)
Triglycerides: 303 mg/dL — ABNORMAL HIGH (ref 0–149)
VLDL Cholesterol Cal: 54 mg/dL — ABNORMAL HIGH (ref 5–40)

## 2020-08-26 ENCOUNTER — Other Ambulatory Visit: Payer: Self-pay

## 2020-08-26 ENCOUNTER — Ambulatory Visit: Payer: Medicare HMO | Admitting: Internal Medicine

## 2020-08-26 ENCOUNTER — Encounter: Payer: Self-pay | Admitting: Internal Medicine

## 2020-08-26 VITALS — BP 118/62 | HR 55 | Ht 70.0 in | Wt 155.0 lb

## 2020-08-26 DIAGNOSIS — Z95810 Presence of automatic (implantable) cardiac defibrillator: Secondary | ICD-10-CM | POA: Diagnosis not present

## 2020-08-26 NOTE — Patient Instructions (Signed)
Medication Instructions:  Your physician recommends that you continue on your current medications as directed. Please refer to the Current Medication list given to you today.  *If you need a refill on your cardiac medications before your next appointment, please call your pharmacy*   Lab Work: None   If you have labs (blood work) drawn today and your tests are completely normal, you will receive your results only by:  MyChart Message (if you have MyChart) OR  A paper copy in the mail If you have any lab test that is abnormal or we need to change your treatment, we will call you to review the results.   Testing/Procedures: None    Follow-Up: At [facility], you and your health needs are our priority.  As part of our continuing mission to provide you with exceptional heart care, we have created designated Provider Care Teams.  These Care Teams include your primary Cardiologist (physician) and Advanced Practice Providers (APPs -  Physician Assistants and Nurse Practitioners) who all work together to provide you with the care you need, when you need it.  We recommend signing up for the patient portal called "MyChart".  Sign up information is provided on this After Visit Summary.  MyChart is used to connect with patients for Virtual Visits (Telemedicine).  Patients are able to view lab/test results, encounter notes, upcoming appointments, etc.  Non-urgent messages can be sent to your provider as well.   To learn more about what you can do with MyChart, go to [email or web address].    Your next appointment:    As needed   The format for your next appointment:   In Person  Provider:   [name]    Other Instructions Thank you for choosing [facility]!

## 2020-08-26 NOTE — Progress Notes (Signed)
HPI Jonathon Ryan returns today for followup. 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 He undewent S-ICD by me and after approx 5 years got infected and was removed by Dr. Lalla Brothers a month ago. He feels well in the interim. No chest pain or sob. No syncope. No edema. No palpitations.  No Known Allergies   Current Outpatient Medications  Medication Sig Dispense Refill  . acetaminophen (TYLENOL) 325 MG tablet Take 1-2 tablets (325-650 mg total) by mouth every 4 (four) hours as needed for mild pain.    [name] aspirin 81 MG tablet Take 1 tablet (81 mg total) by mouth daily. 30 tablet   . carvedilol (COREG) 12.5 MG tablet Take 25 mg by mouth 2 (two) times daily with a meal.     . clopidogrel (PLAVIX) 75 MG tablet Take 1 tablet (75 mg total) by mouth daily.    . fenofibrate (TRICOR) 145 MG tablet Take 1 tablet (145 mg total) by mouth daily. 90 tablet 3  . lisinopril (ZESTRIL) 10 MG tablet Take 1 tablet by mouth once daily (Patient taking differently: Take 10 mg by mouth daily. ) 90 tablet 3  . mirtazapine (REMERON) 30 MG tablet Take 1 tablet (30 mg total) by mouth at bedtime. 90 tablet 3  . omeprazole (PRILOSEC) 40 MG capsule Take 1 capsule (40 mg total) by mouth daily. 90 capsule 3  . rosuvastatin (CRESTOR) 40 MG tablet Take 1 tablet (40 mg total) by mouth daily. 90 tablet 3   No current facility-administered medications for this visit.     Past Medical History:  Diagnosis Date  . Anxiety   . CAD (coronary artery disease) 9/13   s/p CABG  . Cardiac arrest due to underlying cardiac condition (HCC)    successfully rescucitated/ cooled  . Cardiac defibrillator in place   . Cerebrovascular disease 10/15/2012  . Chronic systolic dysfunction of left ventricle   . GERD (gastroesophageal reflux disease)   . Headache(784.0)   . Hyperlipidemia   . Hypertension   . Infection of pacemaker pocket (HCC)     of ICD with subsequent explant  . Ischemic cardiomyopathy    s/p BiV ICD implant Feb 2014 - [address] [name] [place], [place] [id number] (serial # [id number]) right atrial lead and a [name] [place], model 1950-93O (serial number [id number]) right ventricular defibrillator lead. [name] Quartet model [phone] - 86 (serial number [id number]) LV lead. [name] Quadra [place]ura model [place] 5809 [phone] (serial Number [id number]) biventricular ICD.   [name] Left bundle branch block   . Peripheral arterial disease (HCC)   . PONV (postoperative nausea and vomiting)   . Renal artery stenosis (HCC)   . Shortness of breath dyspnea    with exertion  . Spinal headache     ROS:   All systems reviewed and negative except as noted in the HPI.   Past Surgical History:  Procedure Laterality Date  . BI-VENTRICULAR IMPLANTABLE CARDIOVERTER DEFIBRILLATOR [name] 11/25/2012   Procedure: BI-VENTRICULAR IMPLANTABLE CARDIOVERTER DEFIBRILLATOR  (CRT-D);  Surgeon: Hillis Range, MD;  Location: [facility] CATH LAB;  Service: Cardiovascular;  Laterality: [name];  . CAROTID ANGIOGRAM Bilateral 10/14/2012   Procedure: CAROTID ANGIOGRAM;  Surgeon: Nada Libman, MD;  Location: [facility] CATH LAB;  Service: Cardiovascular;  Laterality: Bilateral;  . CAROTID STENT INSERTION [name] 10/14/2012   Procedure: CAROTID STENT INSERTION;  Surgeon: Nada Libman, MD;  Location: [facility] CATH LAB;  Service: Cardiovascular;  Laterality: [name];  . COLONOSCOPY [name] 08/18/2015   Procedure: COLONOSCOPY;  Surgeon: Malissa Hippo, MD;  Location: [facility] ENDO SUITE;  Service: Endoscopy;  Laterality: [name];  830  . CORONARY ARTERY BYPASS GRAFT  06/11/2012   Procedure: CORONARY ARTERY BYPASS GRAFTING (CABG);  Surgeon: [name], MD;  Location: [facility] OR;  Service: Open Heart Surgery;  Laterality: [name];  Coronary Artery Bypass Grafting times four using left internal mammary artery and right greater saphenous vein endoscopically harvested.  . EP IMPLANTABLE DEVICE [name] 03/24/2015    Procedure: SubQ ICD Implant;  Surgeon: Marinus Maw, MD;  Location: [facility] INVASIVE CV LAB;  Service: Cardiovascular;  Laterality: [name];  . ICD LEAD REMOVAL Left 10/04/2014   Procedure: REMOVAL [employer] Jude ICD with lead;  Surgeon: Marinus Maw, MD;  Location: [facility] OR;  Service: Cardiovascular;  Laterality: Left;  Jonathon Ryan is back up for [employer]  . LEFT HEART CATHETERIZATION WITH CORONARY ANGIOGRAM [name] 06/11/2012   Procedure: LEFT HEART CATHETERIZATION WITH CORONARY ANGIOGRAM;  Surgeon: Runell Gess, MD;  Location: [facility] CATH LAB;  Service: Cardiovascular;  Laterality: [name];  . PERCUTANEOUS PLACEMENT INTRAVASCULAR STENT CERVICAL CAROTID ARTERY    . SUBQ ICD CHANGEOUT [name] 07/13/2020   Procedure: SUBQ ICD EXPLANT;  Surgeon: Lanier Prude, MD;  Location: [facility] INVASIVE CV LAB;  Service: Cardiovascular;  Laterality: [name];     Family History  Problem Relation Age of Onset  . Cancer Half-Brother        prostate  . Cancer Half-Brother        mouth     Social History   Socioeconomic History  . Marital status: Married    Spouse name: Jonathon Ryan  . Number of children: 2  . Years of education: 10  . Highest education level: GED or equivalent  Occupational History  . Occupation: disability  Tobacco Use  . Smoking status: Former Smoker    Packs/day: 1.50    Years: 40.00    Pack years: 60.00    Types: Cigarettes    Quit date: 06/11/2012    Years since quitting: 8.2  . Smokeless tobacco: Never Used  Vaping Use  . Vaping Use: Never used  Substance and Sexual Activity  . Alcohol use: Yes    Alcohol/week: 14.0 standard drinks    Types: 14 Cans of beer per week  . Drug use: Yes    Frequency: 2.0 times per week    Types: Marijuana    Comment: 2 per week  . Sexual activity: Yes    Comment: married and 2 step-daughters  Other Topics Concern  . Not on file  Social History Narrative  . Not on file   Social Determinants of Health   Financial Resource Strain: Low Risk   . Difficulty of Paying Living  Expenses: Not hard at all  Food Insecurity: No Food Insecurity  . Worried About Programme researcher, broadcasting/film/video in the Last Year: Never true  . Ran Out of Food in the Last Year: Never true  Transportation Needs: No Transportation Needs  . Lack of Transportation (Medical): No  . Lack of Transportation (Non-Medical): No  Physical Activity: Sufficiently Active  . Days of Exercise per Week: 5 days  . Minutes of Exercise per Session: 30 min  Stress: No Stress Concern Present  . Feeling of Stress : Not at all  Social Connections: Moderately Isolated  . Frequency of Communication with Friends and  Family: More than three times a week  . Frequency of Social Gatherings with Friends and Family: More than three times a week  . Attends Religious Services: Never  . Active Member of Clubs or Organizations: No  . Attends Banker Meetings: Never  . Marital Status: Married  Catering manager Violence: Not At Risk  . Fear of Current or Ex-Partner: No  . Emotionally Abused: No  . Physically Abused: No  . Sexually Abused: No     BP 118/62   Pulse (!) 55   Ht 5\' 10"  (1.778 m)   Wt 155 lb (70.3 kg)   SpO2 98%   BMI 22.24 kg/m   Physical Exam:  Well appearing NAD HEENT: Unremarkable Neck:  No JVD, no thyromegally Lymphatics:  No adenopathy Back:  No CVA tenderness Lungs:  Clear with no wheezes; well healed mid aux line incision and subxyphoid incision.  HEART:  Regular rate rhythm, no murmurs, no rubs, no clicks Abd:  soft, positive bowel sounds, no organomegally, no rebound, no guarding Ext:  2 plus pulses, no edema, no cyanosis, no clubbing Skin:  No rashes no nodules Neuro:  CN II through XII intact, motor grossly intact   Assess/Plan: 1. Late S-ICD pocket infection - he has undergone extraction and appears to be healing nicely. 2. CAD - he denies anginal symtoms. 3. Disp. - at this point no indication for another primary prevention ICD. What is on his skin that led to 2 different  infections is unclear but prohibitive.  4. HTN - his bp is well controlled. No change in meds.  Clorine Swing,MD

## 2020-08-29 ENCOUNTER — Ambulatory Visit (INDEPENDENT_AMBULATORY_CARE_PROVIDER_SITE_OTHER): Payer: Medicare HMO | Admitting: *Deleted

## 2020-08-29 DIAGNOSIS — Z0000 Encounter for general adult medical examination without abnormal findings: Secondary | ICD-10-CM

## 2020-08-29 NOTE — Patient Instructions (Signed)
  MEDICARE ANNUAL WELLNESS VISIT Health Maintenance Summary and Written Plan of Care  [name] ,  Thank you for allowing me to perform your Medicare Annual Wellness Visit and for your ongoing commitment to your health.   Health Maintenance & Immunization History Health Maintenance  Topic Date Due  . COVID-19 Vaccine (1) Never done  . PNA vac Low Risk Adult (1 of 2 - PCV13) 12/13/2019  . TETANUS/TDAP  10/08/2021  . COLONOSCOPY  08/17/2025  . INFLUENZA VACCINE  Completed  . Hepatitis C Screening  Completed  . HIV Screening  Completed   Immunization History  Administered Date(s) Administered  . Fluad Quad(high Dose 65+) 07/14/2020  . Influenza Split 06/18/2012, 07/08/2014, 07/17/2017  . Influenza, High Dose Seasonal PF 07/28/2018  . Influenza, Quadrivalent, Recombinant, Inj, Pf 07/22/2019  . Influenza,inj,Quad PF,6+ Mos 07/28/2018  . Pneumococcal Polysaccharide-23 10/09/2011, 06/17/2012  . Tdap 10/09/2011    These are the patient goals that we discussed: Goals Addressed            This Visit's Progress   . Exercise 3x per week (30 min per time)       08/29/2020 AWV Goal: Exercise for General Health   Patient will verbalize understanding of the benefits of increased physical activity:  Exercising regularly is important. It will improve your overall fitness, flexibility, and endurance.  Regular exercise also will improve your overall health. It can help you control your weight, reduce stress, and improve your bone density.  Over the next year, patient will increase physical activity as tolerated with a goal of at least 150 minutes of moderate physical activity per week.   You can tell that you are exercising at a moderate intensity if your heart starts beating faster and you start breathing faster but can still hold a conversation.  Moderate-intensity exercise ideas include:  Walking 1 mile (1.6 km) in about 15  minutes  Biking  Hiking  Golfing  Dancing  Water aerobics  Patient will verbalize understanding of everyday activities that increase physical activity by providing examples like the following: ? Yard work, such as: ? Pushing a Surveyor, mining ? Raking and bagging leaves ? Washing your car ? Pushing a stroller ? Shoveling snow ? Gardening ? Washing windows or floors  Patient will be able to explain general safety guidelines for exercising:   Before you start a new exercise program, talk with your health care provider.  Do not exercise so much that you hurt yourself, feel dizzy, or get very short of breath.  Wear comfortable clothes and wear shoes with good support.  Drink plenty of water while you exercise to prevent dehydration or heat stroke.  Work out until your breathing and your heartbeat get faster.         This is a list of Health Maintenance Items that are overdue or due now: Health Maintenance Due  Topic Date Due  . COVID-19 Vaccine (1) Never done  . PNA vac Low Risk Adult (1 of 2 - PCV13) 12/13/2019     Orders/Referrals Placed Today: No orders of the defined types were placed in this encounter.  (Contact our referral department at [phone] if you have not spoken with someone about your referral appointment within the next 5 days)    Follow-up Plan Follow-up with Dr. Louanne Skye as scheduled Discuss Advance Directive info with Family

## 2020-08-29 NOTE — Progress Notes (Signed)
MEDICARE ANNUAL WELLNESS VISIT  08/29/2020  Telephone Visit Disclaimer This Medicare AWV was conducted by telephone due to national recommendations for restrictions regarding the COVID-19 Pandemic (e.g. social distancing).  I verified, using two identifiers, that I am speaking with Jonathon Ryan or their authorized healthcare agent. I discussed the limitations, risks, security, and privacy concerns of performing an evaluation and management service by telephone and the potential availability of an in-person appointment in the future. The patient expressed understanding and agreed to proceed.  Location of Patient: Home Location of Provider (nurse):  Office   Subjective:    Jonathon Ryan is a 64 y.o. male patient of Dettinger, Elige Radon, MD who had a Medicare Annual Wellness Visit today via telephone. Jonathon Ryan is Retired and lives with their spouse. he has 2 Step-children. he reports that he is socially active and does interact with friends/family regularly. he is minimally physically active and enjoys golfing.  Patient Care Team: Dettinger, Elige Radon, MD as PCP - General (Family Medicine) Marinus Maw, MD as PCP - Electrophysiology (Cardiology) Donata Clay, Theron Arista, MD as Attending Physician (Cardiothoracic Surgery) Runell Gess, MD as Attending Physician (Cardiology)  Advanced Directives 08/29/2020 07/13/2020 07/13/2020 08/27/2019 08/29/2015 08/18/2015 03/24/2015  Does Patient Have a Medical Advance Directive? No No No No No No No  Would patient like information on creating a medical advance directive? Yes (MAU/Ambulatory/Procedural Areas - Information given) No - Patient declined No - Patient declined No - Patient declined No - patient declined information No - patient declined information No - patient declined information  Pre-existing out of facility DNR order (yellow form or pink MOST form) - - - - - - -    Hospital Utilization Over the Past 12 Months: # of hospitalizations or ER  visits: 1 # of surgeries: 1  Review of Systems    Patient reports that his overall health is unchanged compared to last year.  History obtained from chart review and the patient General ROS: negative  Patient Reported Readings (BP, Pulse, CBG, Weight, etc) none  Pain Assessment Pain : No/denies pain     Current Medications & Allergies (verified) Allergies as of 08/29/2020   No Known Allergies     Medication List       Accurate as of August 29, 2020 10:48 AM. If you have any questions, ask your nurse or doctor.        acetaminophen 325 MG tablet Commonly known as: TYLENOL Take 1-2 tablets (325-650 mg total) by mouth every 4 (four) hours as needed for mild pain.   aspirin 81 MG tablet Take 1 tablet (81 mg total) by mouth daily.   carvedilol 12.5 MG tablet Commonly known as: COREG Take 25 mg by mouth 2 (two) times daily with a meal.   clopidogrel 75 MG tablet Commonly known as: PLAVIX Take 1 tablet (75 mg total) by mouth daily.   fenofibrate 145 MG tablet Commonly known as: TRICOR Take 1 tablet (145 mg total) by mouth daily.   lisinopril 10 MG tablet Commonly known as: ZESTRIL Take 1 tablet by mouth once daily   mirtazapine 30 MG tablet Commonly known as: REMERON Take 1 tablet (30 mg total) by mouth at bedtime.   omeprazole 40 MG capsule Commonly known as: PRILOSEC Take 1 capsule (40 mg total) by mouth daily.   rosuvastatin 40 MG tablet Commonly known as: CRESTOR Take 1 tablet (40 mg total) by mouth daily.       History (reviewed):  Past Medical History:  Diagnosis Date  . Anxiety   . CAD (coronary artery disease) 9/13   s/p CABG  . Cardiac arrest due to underlying cardiac condition (HCC)    successfully rescucitated/ cooled  . Cardiac defibrillator in place   . Cerebrovascular disease 10/15/2012  . Chronic systolic dysfunction of left ventricle   . GERD (gastroesophageal reflux disease)   . Headache(784.0)   . Hyperlipidemia   .  Hypertension   . Infection of pacemaker pocket (HCC)    of ICD with subsequent explant  . Ischemic cardiomyopathy    s/p BiV ICD implant Feb 2014 - [address] [name] [place], [place] 1017PZ-02 (serial # [id number]) right atrial lead and a [name] [place], model 5852-77O (serial number [id number]) right ventricular defibrillator lead. [name] Quartet model [phone] (serial number [id number]) LV lead. [name] Quadra [place]ura model [place] 3614 [phone] (serial Number [id number]) biventricular ICD.   [name] Left bundle branch block   . Peripheral arterial disease (HCC)   . PONV (postoperative nausea and vomiting)   . Renal artery stenosis (HCC)   . Shortness of breath dyspnea    with exertion  . Spinal headache    Past Surgical History:  Procedure Laterality Date  . BI-VENTRICULAR IMPLANTABLE CARDIOVERTER DEFIBRILLATOR [name] 11/25/2012   Procedure: BI-VENTRICULAR IMPLANTABLE CARDIOVERTER DEFIBRILLATOR  (CRT-D);  Surgeon: Hillis Range, MD;  Location: [facility] CATH LAB;  Service: Cardiovascular;  Laterality: [name];  . CAROTID ANGIOGRAM Bilateral 10/14/2012   Procedure: CAROTID ANGIOGRAM;  Surgeon: Nada Libman, MD;  Location: [facility] CATH LAB;  Service: Cardiovascular;  Laterality: Bilateral;  . CAROTID STENT INSERTION [name] 10/14/2012   Procedure: CAROTID STENT INSERTION;  Surgeon: Nada Libman, MD;  Location: [facility] CATH LAB;  Service: Cardiovascular;  Laterality: [name];  . COLONOSCOPY [name] 08/18/2015   Procedure: COLONOSCOPY;  Surgeon: Malissa Hippo, MD;  Location: [facility];  Service: Endoscopy;  Laterality: [name];  830  . CORONARY ARTERY BYPASS GRAFT  06/11/2012   Procedure: CORONARY ARTERY BYPASS GRAFTING (CABG);  Surgeon: [name], MD;  Location: [facility] OR;  Service: Open Heart Surgery;  Laterality: [name];  Coronary Artery Bypass Grafting times four using left internal mammary artery and right greater saphenous vein endoscopically harvested.  . EP IMPLANTABLE DEVICE [name] 03/24/2015   Procedure: SubQ ICD  Implant;  Surgeon: Marinus Maw, MD;  Location: [facility] INVASIVE CV LAB;  Service: Cardiovascular;  Laterality: [name];  . ICD LEAD REMOVAL Left 10/04/2014   Procedure: REMOVAL Saint Jude ICD with lead;  Surgeon: Marinus Maw, MD;  Location: [facility] OR;  Service: Cardiovascular;  Laterality: Left;  [name] is back up for [employer]  . LEFT HEART CATHETERIZATION WITH CORONARY ANGIOGRAM [name] 06/11/2012   Procedure: LEFT HEART CATHETERIZATION WITH CORONARY ANGIOGRAM;  Surgeon: Runell Gess, MD;  Location: [facility] CATH LAB;  Service: Cardiovascular;  Laterality: [name];  . PERCUTANEOUS PLACEMENT INTRAVASCULAR STENT CERVICAL CAROTID ARTERY    . SUBQ ICD CHANGEOUT [name] 07/13/2020   Procedure: SUBQ ICD EXPLANT;  Surgeon: Lanier Prude, MD;  Location: [facility] INVASIVE CV LAB;  Service: Cardiovascular;  Laterality: [name];   Family History  Problem Relation Age of Onset  . Cancer Half-Brother        prostate  . Cancer Half-Brother        mouth   Social History   Socioeconomic History  . Marital status: Married    Spouse name: Rantoul Lions  . Number of children: 2  . Years  of education: 10  . Highest education level: GED or equivalent  Occupational History  . Occupation: disability  Tobacco Use  . Smoking status: Former Smoker    Packs/day: 1.50    Years: 40.00    Pack years: 60.00    Types: Cigarettes    Quit date: 06/11/2012    Years since quitting: 8.2  . Smokeless tobacco: Never Used  Vaping Use  . Vaping Use: Never used  Substance and Sexual Activity  . Alcohol use: Yes    Alcohol/week: 14.0 standard drinks    Types: 14 Cans of beer per week  . Drug use: Yes    Frequency: 2.0 times per week    Types: Marijuana    Comment: 2 per week  . Sexual activity: Yes    Comment: married and 2 step-daughters  Other Topics Concern  . Not on file  Social History Narrative  . Not on file   Social Determinants of Health   Financial Resource Strain: Low Risk   . Difficulty of Paying Living Expenses: Not hard at all    Food Insecurity: No Food Insecurity  . Worried About Programme researcher, broadcasting/film/video in the Last Year: Never true  . Ran Out of Food in the Last Year: Never true  Transportation Needs: No Transportation Needs  . Lack of Transportation (Medical): No  . Lack of Transportation (Non-Medical): No  Physical Activity: Sufficiently Active  . Days of Exercise per Week: 5 days  . Minutes of Exercise per Session: 30 min  Stress: No Stress Concern Present  . Feeling of Stress : Not at all  Social Connections: Socially Isolated  . Frequency of Communication with Friends and Family: Once a week  . Frequency of Social Gatherings with Friends and Family: Once a week  . Attends Religious Services: Never  . Active Member of Clubs or Organizations: No  . Attends Banker Meetings: Never  . Marital Status: Married    Activities of Daily Living In your present state of health, do you have any difficulty performing the following activities: 08/29/2020 07/13/2020  Hearing? N N  Vision? N N  Difficulty concentrating or making decisions? N N  Walking or climbing stairs? N N  Dressing or bathing? N N  Doing errands, shopping? N N  Preparing Food and eating ? N -  Using the Toilet? N -  In the past six months, have you accidently leaked urine? N -  Do you have problems with loss of bowel control? N -  Managing your Medications? N -  Managing your Finances? N -  Housekeeping or managing your Housekeeping? N -  Some recent data might be hidden    Patient Education/ Literacy How often do you need to have someone help you when you read instructions, pamphlets, or other written materials from your doctor or pharmacy?: 1 - Never What is the last grade level you completed in school?: 9th grade  Exercise Current Exercise Habits: Home exercise routine, Type of exercise: walking, Time (Minutes): 30, Frequency (Times/Week): 5, Weekly Exercise (Minutes/Week): 150, Intensity: Mild, Exercise limited by: None  identified  Diet Patient reports consuming 3 meals a day and 2 snack(s) a day Patient reports that his primary diet is: Regular Patient reports that she does have regular access to food.   Depression Screen PHQ 2/9 Scores 08/29/2020 08/11/2020 08/27/2019 08/26/2019 04/24/2019  PHQ - 2 Score 0 0 0 0 0     Fall Risk Fall Risk  08/29/2020 08/11/2020 09/10/2019  08/27/2019 08/26/2019  Falls in the past year? 0 0 0 0 0  Number falls in past yr: 0 0 - - -  Injury with Fall? 0 0 - - -  Risk for fall due to : No Fall Risks - - - -  Follow up Falls evaluation completed Falls evaluation completed - - -     Objective:  Jonathon Ryan seemed alert and oriented and he participated appropriately during our telephone visit.  Blood Pressure Weight BMI  BP Readings from Last 3 Encounters:  08/26/20 118/62  08/11/20 105/60  07/21/20 120/72   Wt Readings from Last 3 Encounters:  08/26/20 155 lb (70.3 kg)  08/11/20 156 lb 9.6 oz (71 kg)  07/21/20 160 lb (72.6 kg)   BMI Readings from Last 1 Encounters:  08/26/20 22.24 kg/m    *Unable to obtain current vital signs, weight, and BMI due to telephone visit type  Hearing/Vision  . Cordaro did not seem to have difficulty with hearing/understanding during the telephone conversation . Reports that he has not had a formal eye exam by an eye care professional within the past year . Reports that he has not had a formal hearing evaluation within the past year *Unable to fully assess hearing and vision during telephone visit type  Cognitive Function: 6CIT Screen 08/29/2020 08/27/2019  What Year? 0 points 0 points  What month? 0 points 0 points  What time? 0 points 0 points  Count back from 20 0 points 0 points  Months in reverse 0 points 0 points  Repeat phrase 0 points 0 points  Total Score 0 0   (Normal:0-7, Significant for Dysfunction: >8)  Normal Cognitive Function Screening: Yes   Immunization & Health Maintenance Record Immunization History    Administered Date(s) Administered  . Fluad Quad(high Dose 65+) 07/14/2020  . Influenza Split 06/18/2012, 07/08/2014, 07/17/2017  . Influenza, High Dose Seasonal PF 07/28/2018  . Influenza, Quadrivalent, Recombinant, Inj, Pf 07/22/2019  . Influenza,inj,Quad PF,6+ Mos 07/28/2018  . Pneumococcal Polysaccharide-23 10/09/2011, 06/17/2012  . Tdap 10/09/2011    Health Maintenance  Topic Date Due  . COVID-19 Vaccine (1) Never done  . PNA vac Low Risk Adult (1 of 2 - PCV13) 12/13/2019  . TETANUS/TDAP  10/08/2021  . COLONOSCOPY  08/17/2025  . INFLUENZA VACCINE  Completed  . Hepatitis C Screening  Completed  . HIV Screening  Completed       Assessment  This is a routine wellness examination for Jonathon PootBenny E Danko.  Health Maintenance: Due or Overdue Health Maintenance Due  Topic Date Due  . COVID-19 Vaccine (1) Never done  . PNA vac Low Risk Adult (1 of 2 - PCV13) 12/13/2019    Jonathon PootBenny E Ryan does not need a referral for Community Assistance: Care Management:   no Social Work:    no Prescription Assistance:  no Nutrition/Diabetes Education:  no   Plan:  Personalized Goals Goals Addressed            This Visit's Progress   . Exercise 3x per week (30 min per time)       08/29/2020 AWV Goal: Exercise for General Health   Patient will verbalize understanding of the benefits of increased physical activity:  Exercising regularly is important. It will improve your overall fitness, flexibility, and endurance.  Regular exercise also will improve your overall health. It can help you control your weight, reduce stress, and improve your bone density.  Over the next year, patient will increase physical activity  as tolerated with a goal of at least 150 minutes of moderate physical activity per week.   You can tell that you are exercising at a moderate intensity if your heart starts beating faster and you start breathing faster but can still hold a conversation.  Moderate-intensity  exercise ideas include:  Walking 1 mile (1.6 km) in about 15 minutes  Biking  Hiking  Golfing  Dancing  Water aerobics  Patient will verbalize understanding of everyday activities that increase physical activity by providing examples like the following: ? Yard work, such as: ? Pushing a Surveyor, mining ? Raking and bagging leaves ? Washing your car ? Pushing a stroller ? Shoveling snow ? Gardening ? Washing windows or floors  Patient will be able to explain general safety guidelines for exercising:   Before you start a new exercise program, talk with your health care provider.  Do not exercise so much that you hurt yourself, feel dizzy, or get very short of breath.  Wear comfortable clothes and wear shoes with good support.  Drink plenty of water while you exercise to prevent dehydration or heat stroke.  Work out until your breathing and your heartbeat get faster.       Personalized Health Maintenance & Screening Recommendations  Pneumococcal vaccine  Advanced directives: has NO advanced directive  - add't info requested. Referral to SW: no  Lung Cancer Screening Recommended: no (Low Dose CT Chest recommended if Age 52-80 years, 30 pack-year currently smoking OR have quit w/in past 15 years) Hepatitis C Screening recommended: not applicable HIV Screening recommended: not applicable  Advanced Directives: Written information was not prepared per patient's request.  Referrals & Orders No orders of the defined types were placed in this encounter.   Follow-up Plan . Follow-up with Dettinger, Elige Radon, MD as planned   I have personally reviewed and noted the following in the patient's chart:   . Medical and social history . Use of alcohol, tobacco or illicit drugs  . Current medications and supplements . Functional ability and status . Nutritional status . Physical activity . Advanced directives . List of other physicians . Hospitalizations, surgeries, and ER  visits in previous 12 months . Vitals . Screenings to include cognitive, depression, and falls . Referrals and appointments  In addition, I have reviewed and discussed with Jonathon Ryan certain preventive protocols, quality metrics, and best practice recommendations. A written personalized care plan for preventive services as well as general preventive health recommendations is available and can be mailed to the patient at his request.      Caryl Bis, LPN  16/07/[id number]   AVS Printed and Mailed to patient

## 2020-09-26 ENCOUNTER — Other Ambulatory Visit: Payer: Self-pay | Admitting: Cardiovascular Disease

## 2020-10-14 ENCOUNTER — Telehealth: Payer: Self-pay | Admitting: Family Medicine

## 2020-10-14 NOTE — Telephone Encounter (Signed)
Spoke with patient, he reports he has enough medication and refills and wants to cancel his appointment on 11/11/2019.  I canceled per patient request.

## 2020-10-14 NOTE — Telephone Encounter (Signed)
Pt came in wanting to get labs today but there were no orders in and pt has appt 2/3. Explained that he could not get labs without having an order put in and that Dr Dettinger is not in the office today. Pt stated that he did not want the appt that was scheduled and only wanted to get his labs so that he could get his medicine. When he was told that he needed the appt to get his medication, he said that he did not want it and still only wanted the labs. Offered to reschedule the appt and put in a message to request having an order put in for pt to get labs then pt left upset without either.

## 2020-11-10 ENCOUNTER — Ambulatory Visit: Payer: Medicare HMO | Admitting: Family Medicine

## 2020-11-11 ENCOUNTER — Other Ambulatory Visit: Payer: Self-pay

## 2020-11-11 ENCOUNTER — Other Ambulatory Visit: Payer: Medicare HMO

## 2020-11-11 DIAGNOSIS — I10 Essential (primary) hypertension: Secondary | ICD-10-CM | POA: Diagnosis not present

## 2020-11-11 LAB — CBC WITH DIFFERENTIAL/PLATELET
Basophils Absolute: 0.1 10*3/uL (ref 0.0–0.2)
Basos: 1 %
EOS (ABSOLUTE): 0.2 10*3/uL (ref 0.0–0.4)
Eos: 2 %
Hematocrit: 47 % (ref 37.5–51.0)
Hemoglobin: 15.4 g/dL (ref 13.0–17.7)
Immature Grans (Abs): 0 10*3/uL (ref 0.0–0.1)
Immature Granulocytes: 0 %
Lymphocytes Absolute: 1.9 10*3/uL (ref 0.7–3.1)
Lymphs: 19 %
MCH: 28.4 pg (ref 26.6–33.0)
MCHC: 32.8 g/dL (ref 31.5–35.7)
MCV: 87 fL (ref 79–97)
Monocytes Absolute: 0.7 10*3/uL (ref 0.1–0.9)
Monocytes: 6 %
Neutrophils Absolute: 7.3 10*3/uL — ABNORMAL HIGH (ref 1.4–7.0)
Neutrophils: 72 %
Platelets: 411 10*3/uL (ref 150–450)
RBC: 5.42 x10E6/uL (ref 4.14–5.80)
RDW: 12.5 % (ref 11.6–15.4)
WBC: 10.2 10*3/uL (ref 3.4–10.8)

## 2020-11-11 LAB — CMP14+EGFR
ALT: 14 IU/L (ref 0–44)
AST: 16 IU/L (ref 0–40)
Albumin/Globulin Ratio: 1.7 (ref 1.2–2.2)
Albumin: 5.1 g/dL — ABNORMAL HIGH (ref 3.8–4.8)
Alkaline Phosphatase: 63 IU/L (ref 44–121)
BUN/Creatinine Ratio: 14 (ref 10–24)
BUN: 18 mg/dL (ref 8–27)
Bilirubin Total: 0.4 mg/dL (ref 0.0–1.2)
CO2: 23 mmol/L (ref 20–29)
Calcium: 10.7 mg/dL — ABNORMAL HIGH (ref 8.6–10.2)
Chloride: 99 mmol/L (ref 96–106)
Creatinine, Ser: 1.3 mg/dL — ABNORMAL HIGH (ref 0.76–1.27)
GFR calc Af Amer: 66 mL/min/{1.73_m2} (ref >59)
GFR calc non Af Amer: 57 mL/min/{1.73_m2} — ABNORMAL LOW (ref >59)
Globulin, Total: 3 g/dL (ref 1.5–4.5)
Glucose: 78 mg/dL (ref 65–99)
Potassium: 5.4 mmol/L — ABNORMAL HIGH (ref 3.5–5.2)
Sodium: 137 mmol/L (ref 134–144)
Total Protein: 8.1 g/dL (ref 6.0–8.5)

## 2020-11-11 LAB — LIPID PANEL
Chol/HDL Ratio: 5.9 ratio — ABNORMAL HIGH (ref 0.0–5.0)
Cholesterol, Total: 177 mg/dL (ref 100–199)
HDL: 30 mg/dL — ABNORMAL LOW (ref >39)
LDL Chol Calc (NIH): 105 mg/dL — ABNORMAL HIGH (ref 0–99)
Triglycerides: 241 mg/dL — ABNORMAL HIGH (ref 0–149)
VLDL Cholesterol Cal: 42 mg/dL — ABNORMAL HIGH (ref 5–40)

## 2020-11-14 ENCOUNTER — Other Ambulatory Visit: Payer: Self-pay

## 2020-11-14 ENCOUNTER — Ambulatory Visit (INDEPENDENT_AMBULATORY_CARE_PROVIDER_SITE_OTHER): Payer: Medicare HMO | Admitting: Family Medicine

## 2020-11-14 ENCOUNTER — Encounter: Payer: Self-pay | Admitting: Family Medicine

## 2020-11-14 VITALS — BP 120/64 | HR 60 | Temp 96.4°F | Resp 20 | Ht 70.0 in | Wt 153.0 lb

## 2020-11-14 DIAGNOSIS — I10 Essential (primary) hypertension: Secondary | ICD-10-CM | POA: Diagnosis not present

## 2020-11-14 DIAGNOSIS — Z23 Encounter for immunization: Secondary | ICD-10-CM

## 2020-11-14 DIAGNOSIS — E782 Mixed hyperlipidemia: Secondary | ICD-10-CM

## 2020-11-14 DIAGNOSIS — N179 Acute kidney failure, unspecified: Secondary | ICD-10-CM | POA: Diagnosis not present

## 2020-11-14 DIAGNOSIS — Z716 Tobacco abuse counseling: Secondary | ICD-10-CM | POA: Diagnosis not present

## 2020-11-14 MED ORDER — NICOTINE 21-14-7 MG/24HR TD KIT: 1.0000 | PACK | Freq: Every day | TRANSDERMAL | 0 refills | Status: DC

## 2020-11-14 MED ORDER — EZETIMIBE 10 MG PO TABS: 10.0000 mg | ORAL_TABLET | Freq: Every day | ORAL | 3 refills | Status: DC

## 2020-11-14 NOTE — Progress Notes (Addendum)
BP 120/64   Pulse 60   Temp (!) 96.4 F (35.8 C)   Resp 20   Ht 5' 10"  (1.778 m)   Wt 153 lb (69.4 kg)   SpO2 100%   BMI 21.95 kg/m    Subjective:   Patient ID: Jonathon Ryan, male    DOB: [birth year], 65 y.o.   MRN: [id number]  HPI: Jonathon Ryan is a 65 y.o. male presenting on 11/14/2020 for Medical Management of Chronic Issues and Hypertension (3 mo )   HPI Hypertension Patient is currently on lisinopril, and their blood pressure today is 120/64. Patient denies any lightheadedness or dizziness. Patient denies headaches, blurred vision, chest pains, shortness of breath, or weakness. Denies any side effects from medication and is content with current medication.   Hyperlipidemia and CAD Patient is coming in for recheck of his hyperlipidemia. The patient is currently taking Crestor and fenofibrate. They deny any issues with myalgias or history of liver damage from it. They deny any focal numbness or weakness or chest pain.   Patient has started smoking again and would like to quit, he said he started up over this past year because of stressors and having to move out of his house.  Relevant past medical, surgical, family and social history reviewed and updated as indicated. Interim medical history since our last visit reviewed. Allergies and medications reviewed and updated.  Review of Systems  Constitutional: Negative for chills and fever.  Eyes: Negative for visual disturbance.  Respiratory: Negative for shortness of breath and wheezing.   Cardiovascular: Negative for chest pain and leg swelling.  Musculoskeletal: Negative for back pain and gait problem.  Skin: Negative for rash.  Neurological: Negative for dizziness, weakness, light-headedness and numbness.  All other systems reviewed and are negative.   Per HPI unless specifically indicated above   Allergies as of 11/14/2020   No Known Allergies     Medication List       Accurate as of November 14, 2020  8:48 AM. If  you have any questions, ask your nurse or doctor.        acetaminophen 325 MG tablet Commonly known as: TYLENOL Take 1-2 tablets (325-650 mg total) by mouth every 4 (four) hours as needed for mild pain.   aspirin 81 MG tablet Take 1 tablet (81 mg total) by mouth daily.   carvedilol 25 MG tablet Commonly known as: COREG Take 12.5 mg by mouth 2 (two) times daily with a meal. What changed: Another medication with the same name was removed. Continue taking this medication, and follow the directions you see here. Changed by: Fransisca Kaufmann Braylynn Lewing, MD   clopidogrel 75 MG tablet Commonly known as: PLAVIX Take 1 tablet (75 mg total) by mouth daily.   fenofibrate 145 MG tablet Commonly known as: TRICOR Take 1 tablet (145 mg total) by mouth daily.   lisinopril 10 MG tablet Commonly known as: ZESTRIL Take 1 tablet by mouth once daily   mirtazapine 30 MG tablet Commonly known as: REMERON Take 1 tablet (30 mg total) by mouth at bedtime.   omeprazole 40 MG capsule Commonly known as: PRILOSEC Take 1 capsule (40 mg total) by mouth daily.   rosuvastatin 40 MG tablet Commonly known as: CRESTOR Take 1 tablet (40 mg total) by mouth daily.        Objective:   BP 120/64   Pulse 60   Temp (!) 96.4 F (35.8 C)   Resp 20   Ht 5' 10"  (  1.778 m)   Wt 153 lb (69.4 kg)   SpO2 100%   BMI 21.95 kg/m   Wt Readings from Last 3 Encounters:  11/14/20 153 lb (69.4 kg)  08/26/20 155 lb (70.3 kg)  08/11/20 156 lb 9.6 oz (71 kg)    Physical Exam Vitals and nursing note reviewed.  Constitutional:      General: He is not in acute distress.    Appearance: He is well-developed and well-nourished. He is not diaphoretic.  Eyes:     General: No scleral icterus.    Extraocular Movements: EOM normal.     Conjunctiva/sclera: Conjunctivae normal.  Neck:     Thyroid: No thyromegaly.  Cardiovascular:     Rate and Rhythm: Normal rate and regular rhythm.     Pulses: Intact distal pulses.     Heart  sounds: Normal heart sounds. No murmur heard.   Pulmonary:     Effort: Pulmonary effort is normal. No respiratory distress.     Breath sounds: Normal breath sounds. No wheezing.  Musculoskeletal:        General: No edema. Normal range of motion.     Cervical back: Neck supple.  Lymphadenopathy:     Cervical: No cervical adenopathy.  Skin:    General: Skin is warm and dry.     Findings: No rash.  Neurological:     Mental Status: He is alert and oriented to person, place, and time.     Coordination: Coordination normal.  Psychiatric:        Mood and Affect: Mood and affect normal.        Behavior: Behavior normal.     Results for orders placed or performed in visit on 11/11/20  Lipid panel  Result Value Ref Range   Cholesterol, Total 177 100 - 199 mg/dL   Triglycerides 241 (H) 0 - 149 mg/dL   HDL 30 (L) >39 mg/dL   VLDL Cholesterol Cal 42 (H) 5 - 40 mg/dL   LDL Chol Calc (NIH) 105 (H) 0 - 99 mg/dL   Chol/HDL Ratio 5.9 (H) 0.0 - 5.0 ratio  CMP14+EGFR  Result Value Ref Range   Glucose 78 65 - 99 mg/dL   BUN 18 8 - 27 mg/dL   Creatinine, Ser 1.30 (H) 0.76 - 1.27 mg/dL   GFR calc non Af Amer 57 (L) >59 mL/min/1.73   GFR calc Af Amer 66 >59 mL/min/1.73   BUN/Creatinine Ratio 14 10 - 24   Sodium 137 134 - 144 mmol/L   Potassium 5.4 (H) 3.5 - 5.2 mmol/L   Chloride 99 96 - 106 mmol/L   CO2 23 20 - 29 mmol/L   Calcium 10.7 (H) 8.6 - 10.2 mg/dL   Total Protein 8.1 6.0 - 8.5 g/dL   Albumin 5.1 (H) 3.8 - 4.8 g/dL   Globulin, Total 3.0 1.5 - 4.5 g/dL   Albumin/Globulin Ratio 1.7 1.2 - 2.2   Bilirubin Total 0.4 0.0 - 1.2 mg/dL   Alkaline Phosphatase 63 44 - 121 IU/L   AST 16 0 - 40 IU/L   ALT 14 0 - 44 IU/L  CBC with Differential/Platelet  Result Value Ref Range   WBC 10.2 3.4 - 10.8 x10E3/uL   RBC 5.42 4.14 - 5.80 x10E6/uL   Hemoglobin 15.4 13.0 - 17.7 g/dL   Hematocrit 47.0 37.5 - 51.0 %   MCV 87 79 - 97 fL   MCH 28.4 26.6 - 33.0 pg   MCHC 32.8 31.5 - 35.7 g/dL   RDW  12.5 11.6 - 15.4 %   Platelets 411 150 - 450 x10E3/uL   Neutrophils 72 Not Estab. %   Lymphs 19 Not Estab. %   Monocytes 6 Not Estab. %   Eos 2 Not Estab. %   Basos 1 Not Estab. %   Neutrophils Absolute 7.3 (H) 1.4 - 7.0 x10E3/uL   Lymphocytes Absolute 1.9 0.7 - 3.1 x10E3/uL   Monocytes Absolute 0.7 0.1 - 0.9 x10E3/uL   EOS (ABSOLUTE) 0.2 0.0 - 0.4 x10E3/uL   Basophils Absolute 0.1 0.0 - 0.2 x10E3/uL   Immature Granulocytes 0 Not Estab. %   Immature Grans (Abs) 0.0 0.0 - 0.1 x10E3/uL    Assessment & Plan:   Problem List Items Addressed This Visit      Cardiovascular and Mediastinum   Hypertension - Primary   Relevant Medications   carvedilol (COREG) 25 MG tablet   ezetimibe (ZETIA) 10 MG tablet     Other   HLD (hyperlipidemia)   Relevant Medications   carvedilol (COREG) 25 MG tablet   ezetimibe (ZETIA) 10 MG tablet    Other Visit Diagnoses    AKI (acute kidney injury) ([place])       Encounter for smoking cessation counseling       Relevant Medications   Nicotine 21-14-7 MG/24HR KIT    We will add Zetia to see if we can get his cholesterol down, give nicotine patches for smoking cessation, recheck kidney function when he returns for his next visit and recommended maintaining hydration.  Follow up plan: Return in about 6 months (around 05/14/2021), or if symptoms worsen or fail to improve, for Hypertension and hyperlipidemia recheck.  Counseling provided for all of the vaccine components Orders Placed This Encounter  Procedures  . Pneumococcal conjugate vaccine 13-valent    Caryl Pina, MD [facility] 11/14/2020, 8:48 AM

## 2020-11-15 ENCOUNTER — Telehealth: Payer: Self-pay

## 2020-11-15 NOTE — Telephone Encounter (Signed)
Pt called stating that the Ezetimibe Rx that was sent in to the pharmacy yesterday is not covered by his insurance and will cost him over $200.. needs something else called in for him that his insurance will cover.

## 2020-11-16 NOTE — Telephone Encounter (Signed)
Tell him to use good [email or web address], at [facility] it looks like it is between $16 and $17 if you print out a coupon or take it on his phone for goodRx.com for monthly supply.

## 2020-11-16 NOTE — Telephone Encounter (Signed)
Patient aware.

## 2021-03-21 ENCOUNTER — Other Ambulatory Visit (HOSPITAL_COMMUNITY): Payer: Self-pay | Admitting: Cardiovascular Disease

## 2021-03-21 DIAGNOSIS — I701 Atherosclerosis of renal artery: Secondary | ICD-10-CM

## 2021-03-21 DIAGNOSIS — I6523 Occlusion and stenosis of bilateral carotid arteries: Secondary | ICD-10-CM

## 2021-03-22 ENCOUNTER — Other Ambulatory Visit: Payer: Self-pay | Admitting: Cardiovascular Disease

## 2021-04-03 ENCOUNTER — Encounter: Payer: Self-pay | Admitting: *Deleted

## 2021-04-03 ENCOUNTER — Other Ambulatory Visit: Payer: Self-pay

## 2021-04-03 ENCOUNTER — Other Ambulatory Visit (HOSPITAL_COMMUNITY): Payer: Self-pay | Admitting: Cardiovascular Disease

## 2021-04-03 ENCOUNTER — Ambulatory Visit (HOSPITAL_BASED_OUTPATIENT_CLINIC_OR_DEPARTMENT_OTHER)
Admission: RE | Admit: 2021-04-03 | Discharge: 2021-04-03 | Disposition: A | Discharge status: Home / Self Care | Payer: Medicare HMO | Source: Ambulatory Visit | Attending: Cardiology | Admitting: Cardiology

## 2021-04-03 ENCOUNTER — Ambulatory Visit (HOSPITAL_COMMUNITY)
Admission: RE | Admit: 2021-04-03 | Discharge: 2021-04-03 | Disposition: A | Discharge status: Home / Self Care | Payer: Medicare HMO | Source: Ambulatory Visit | Attending: Cardiology | Admitting: Cardiology

## 2021-04-03 DIAGNOSIS — I6523 Occlusion and stenosis of bilateral carotid arteries: Secondary | ICD-10-CM

## 2021-04-03 DIAGNOSIS — I701 Atherosclerosis of renal artery: Secondary | ICD-10-CM | POA: Insufficient documentation

## 2021-04-03 DIAGNOSIS — Z95828 Presence of other vascular implants and grafts: Secondary | ICD-10-CM

## 2021-04-05 ENCOUNTER — Ambulatory Visit: Payer: Medicare HMO | Admitting: Cardiovascular Disease

## 2021-04-05 ENCOUNTER — Encounter: Payer: Self-pay | Admitting: Cardiovascular Disease

## 2021-04-05 ENCOUNTER — Other Ambulatory Visit: Payer: Self-pay

## 2021-04-05 VITALS — BP 130/66 | HR 56 | Ht 70.0 in | Wt 166.0 lb

## 2021-04-05 DIAGNOSIS — Z720 Tobacco use: Secondary | ICD-10-CM | POA: Diagnosis not present

## 2021-04-05 DIAGNOSIS — I255 Ischemic cardiomyopathy: Secondary | ICD-10-CM | POA: Diagnosis not present

## 2021-04-05 DIAGNOSIS — E782 Mixed hyperlipidemia: Secondary | ICD-10-CM

## 2021-04-05 DIAGNOSIS — I2510 Atherosclerotic heart disease of native coronary artery without angina pectoris: Secondary | ICD-10-CM

## 2021-04-05 DIAGNOSIS — I2583 Coronary atherosclerosis due to lipid rich plaque: Secondary | ICD-10-CM

## 2021-04-05 DIAGNOSIS — I701 Atherosclerosis of renal artery: Secondary | ICD-10-CM | POA: Diagnosis not present

## 2021-04-05 DIAGNOSIS — I739 Peripheral vascular disease, unspecified: Secondary | ICD-10-CM | POA: Diagnosis not present

## 2021-04-05 DIAGNOSIS — I10 Essential (primary) hypertension: Secondary | ICD-10-CM | POA: Diagnosis not present

## 2021-04-05 DIAGNOSIS — I6522 Occlusion and stenosis of left carotid artery: Secondary | ICD-10-CM

## 2021-04-05 NOTE — Progress Notes (Signed)
04/05/2021 Jonathon Ryan   05/23/1956  [id number]  Primary Physician Dettinger, Fransisca Kaufmann, MD Primary Cardiologist: Lorretta Harp MD FACP, [place], [place], [place]  HPI:  Jonathon Ryan is a 65 y.o.  thin-appearing, married Caucasian male with no children who I last saw in the office 04/01/2020.  He had witnessed cardiac arrest with bystander CPR and defibrillation, arctic sun who presented for emergent cardiac catheterization by myself revealing left main 3-vessel disease and he ultimately underwent emergency coronary artery bypass grafting by Dr. Tharon Aquas Trigt with LIMA to his LAD, vein to a PDA, sequential vein to OM1 and 2. His EF was 25% to 30%. He made a full neurologic recovery. He is wearing a LifeVest. His other problems include hypertension and hyperlipidemia and discontinued tobacco abuse which he stopped June 21, 2012. He also has peripheral vascular occlusive disease with high-grade left internal carotid artery stenosis followed by duplex ultrasound, high-grade right renal artery stenosis and right iliac stenosis though he denies claudication. We have gotten Dopplers on both his renals and his lower extremities. He had a Myoview stress test that showed an inferior scar without ischemia with an EF in the 25%-30% range confirmed by 2D echo. I performed carotid stenting on him under the "sapphire protocol" for high risk asymptomatic patients on October 14, 2012, with an excellent result. Followup Dopplers of his carotidis performed on October 28, 2012, were normal. Dr. Thompson Grayer performed implantation of a BiV ICD on November 25, 2012, which has resulted in marked improvement in him clinically with regards to exercise tolerance. We continue to follow his carotid and renal Dopplers which have remained stable. His last carotid, renal and lower actually a partial Doppler studies performed in the last one 2 years have been stable. He denies claudication. He did unfortunately suffer a pocket  infection of his ICD requiring explant. He also had a subcutaneous ICD placed as followed by Dr. Lovena Le.   I saw him a year ago he is remained stable.  He continues to not deny chest pain or shortness of breath.   Since I saw him 12 months ago he is remained stable.  His subcutaneous ICD which was implanted by Dr. Quentin Ore was explanted because of infection.  Unfortunately he is gone back to smoking a pack a day.  We had a long discussion about this.  He denies chest pain or shortness of breath.  His renal Doppler suggest mild progression of his right renal aortic ratio     Current Meds  Medication Sig   acetaminophen (TYLENOL) 325 MG tablet Take 1-2 tablets (325-650 mg total) by mouth every 4 (four) hours as needed for mild pain.   aspirin 81 MG tablet Take 1 tablet (81 mg total) by mouth daily.   carvedilol (COREG) 25 MG tablet TAKE 1 TABLET BY MOUTH TWICE DAILY WITH MEALS   clopidogrel (PLAVIX) 75 MG tablet Take 1 tablet (75 mg total) by mouth daily.   ezetimibe (ZETIA) 10 MG tablet Take 1 tablet (10 mg total) by mouth daily.   fenofibrate (TRICOR) 145 MG tablet Take 1 tablet (145 mg total) by mouth daily.   lisinopril (ZESTRIL) 10 MG tablet Take 1 tablet by mouth once daily (Patient taking differently: Take 10 mg by mouth daily.)   mirtazapine (REMERON) 30 MG tablet Take 1 tablet (30 mg total) by mouth at bedtime.   omeprazole (PRILOSEC) 40 MG capsule Take 1 capsule (40 mg total) by mouth daily.   rosuvastatin (  CRESTOR) 40 MG tablet Take 1 tablet (40 mg total) by mouth daily.   [DISCONTINUED] Nicotine 21-14-7 MG/24HR KIT Place 1 patch onto the skin daily.     No Known Allergies  Social History   Socioeconomic History   Marital status: Married    Spouse name: Jonathon Ryan   Number of children: 2   Years of education: 10   Highest education level: GED or equivalent  Occupational History   Occupation: disability  Tobacco Use   Smoking status: Former    Packs/day: 1.50    Years: 40.00     Pack years: 60.00    Types: Cigarettes    Quit date: 06/11/2012    Years since quitting: 8.8   Smokeless tobacco: Never  Vaping Use   Vaping Use: Never used  Substance and Sexual Activity   Alcohol use: Yes    Alcohol/week: 14.0 standard drinks    Types: 14 Cans of beer per week   Drug use: Yes    Frequency: 2.0 times per week    Types: Marijuana    Comment: 2 per week   Sexual activity: Yes    Comment: married and 2 step-daughters  Other Topics Concern   Not on file  Social History Narrative   Not on file   Social Determinants of Health   Financial Resource Strain: Low Risk    Difficulty of Paying Living Expenses: Not hard at all  Food Insecurity: No Food Insecurity   Worried About Charity fundraiser in the Last Year: Never true   Arboriculturist in the Last Year: Never true  Transportation Needs: No Transportation Needs   Lack of Transportation (Medical): No   Lack of Transportation (Non-Medical): No  Physical Activity: Sufficiently Active   Days of Exercise per Week: 5 days   Minutes of Exercise per Session: 30 min  Stress: No Stress Concern Present   Feeling of Stress : Not at all  Social Connections: Socially Isolated   Frequency of Communication with Friends and Family: Once a week   Frequency of Social Gatherings with Friends and Family: Once a week   Attends Religious Services: Never   Marine scientist or Organizations: No   Attends Music therapist: Never   Marital Status: Married  Human resources officer Violence: Not At Risk   Fear of Current or Ex-Partner: No   Emotionally Abused: No   Physically Abused: No   Sexually Abused: No     Review of Systems: General: negative for chills, fever, night sweats or weight changes.  Cardiovascular: negative for chest pain, dyspnea on exertion, edema, orthopnea, palpitations, paroxysmal nocturnal dyspnea or shortness of breath Dermatological: negative for rash Respiratory: negative for cough or  wheezing Urologic: negative for hematuria Abdominal: negative for nausea, vomiting, diarrhea, bright red blood per rectum, melena, or hematemesis Neurologic: negative for visual changes, syncope, or dizziness All other systems reviewed and are otherwise negative except as noted above.    Blood pressure 130/66, pulse (!) 56, height 5' 10"  (1.778 m), weight 166 lb (75.3 kg), SpO2 99 %.  General appearance: alert and no distress Neck: no adenopathy, no carotid bruit, no JVD, supple, symmetrical, trachea midline, and thyroid not enlarged, symmetric, no tenderness/mass/nodules Lungs: clear to auscultation bilaterally Heart: regular rate and rhythm, S1, S2 normal, no murmur, click, rub or gallop Extremities: extremities normal, atraumatic, no cyanosis or edema Pulses: 2+ and symmetric Skin: Skin color, texture, turgor normal. No rashes or lesions Neurologic: Grossly normal  EKG not performed today.  ASSESSMENT AND PLAN:   Tobacco abuse Recently restarted smoking 10 months ago, currently smoking 1 pack/day, recalcitrant to receptor modification.  HLD (hyperlipidemia) History of hyperlipidemia on high-dose atorvastatin, Zetia and fenofibrate with lipid profile performed 11/11/2020 revealing total cholesterol 177, LDL 105 and HDL 30.  He is not at goal for secondary prevention.  He may benefit from being on a PCSK9.  I am referring him to Dr. Debara Pickett for further evaluation and treatment.  Ischemic cardiomyopathy History is of ischemic cardiomyopathy with an EF in the 30% range on beta-blocker and lisinopril status post remote ICD implantation by Dr. Rayann Heman 11/25/2012 (BiV ICD) which ultimately was explanted because of pocket infection.  He underwent subcutaneous ICD implantation by Dr. Quentin Ore which was subsequently explanted as well because of infection.  PVD, 90% Rt RAS, 80% Rt Iliac at cath History of 90% right renal artery stenosis which we have followed by duplex ultrasound most recently  performed 04/03/2021 revealing slight increase in his right renal aortic ratio from 3.9-4.67 with stable renal dimensions.  We will continue to follow this on annual basis.  CAD, 90% LM, Total RCA, 80-90% CFX- S/P urgent CABG X 11 Jun 2012 History of CAD status post CABG September 2013 by Dr. Darcey Nora LIMA to the LAD, vein to the PDA, sequential vein to OM1 and to.  He has been asymptomatic from this since.  Carotid artery stenosis, LICA stent 12/13/98 History of carotid artery disease status post left internal carotid artery stenting under the "sapphire protocol by myself 10/14/2012.  Recent carotid Doppler studies performed 04/03/2021 revealed the stent to be widely patent.  Hypertension History of essential hypertension a blood pressure measured today 130/66.  He is on carvedilol and lisinopril.     Lorretta Harp MD FACP,[name],[name], [facility] 04/05/2021 11:26 AM

## 2021-04-05 NOTE — Assessment & Plan Note (Signed)
History of essential hypertension a blood pressure measured today 130/66.  He is on carvedilol and lisinopril.

## 2021-04-05 NOTE — Assessment & Plan Note (Signed)
History of hyperlipidemia on high-dose atorvastatin, Zetia and fenofibrate with lipid profile performed 11/11/2020 revealing total cholesterol 177, LDL 105 and HDL 30.  He is not at goal for secondary prevention.  He may benefit from being on a PCSK9.  I am referring him to Dr. Rennis Golden for further evaluation and treatment.

## 2021-04-05 NOTE — Patient Instructions (Signed)
Medication Instructions:  Your physician recommends that you continue on your current medications as directed. Please refer to the Current Medication list given to you today.  *If you need a refill on your cardiac medications before your next appointment, please call your pharmacy*  Lab Work: NONE ordered at this time of appointment   If you have labs (blood work) drawn today and your tests are completely normal, you will receive your results only by: MyChart Message (if you have MyChart) OR A paper copy in the mail If you have any lab test that is abnormal or we need to change your treatment, we will call you to review the results.  Testing/Procedures: Your physician has requested that you have a renal artery duplex. During this test, an ultrasound is used to evaluate blood flow to the kidneys. Allow one hour for this exam. Do not eat after midnight the day before and avoid carbonated beverages. Take your medications as you usually do.  Please schedule for 1 year   Your physician has requested that you have a carotid duplex. This test is an ultrasound of the carotid arteries in your neck. It looks at blood flow through these arteries that supply the brain with blood. Allow one hour for this exam. There are no restrictions or special instructions.  Please schedule for 1 year   Your physician has requested that you have a lower extremity arterial exercise duplex. During this test, exercise and ultrasound are used to evaluate arterial blood flow in the legs. Allow one hour for this exam. There are no restrictions or special instructions.  Please schedule for 1 year   Follow-Up: At [facility], you and your health needs are our priority.  As part of our continuing mission to provide you with exceptional heart care, we have created designated Provider Care Teams.  These Care Teams include your primary Cardiologist (physician) and Advanced Practice Providers (APPs -  Physician Assistants and  Nurse Practitioners) who all work together to provide you with the care you need, when you need it.  Your next appointment:   1 year(s)  The format for your next appointment:   In Person  Provider:   Nanetta Batty, MD  Other Instructions

## 2021-04-05 NOTE — Assessment & Plan Note (Signed)
History of CAD status post CABG September 2013 by Dr. Maren Beach LIMA to the LAD, vein to the PDA, sequential vein to OM1 and to.  He has been asymptomatic from this since.

## 2021-04-05 NOTE — Assessment & Plan Note (Signed)
History of 90% right renal artery stenosis which we have followed by duplex ultrasound most recently performed 04/03/2021 revealing slight increase in his right renal aortic ratio from 3.9-4.67 with stable renal dimensions.  We will continue to follow this on annual basis.

## 2021-04-05 NOTE — Assessment & Plan Note (Signed)
History is of ischemic cardiomyopathy with an EF in the 30% range on beta-blocker and lisinopril status post remote ICD implantation by [name] 11/25/2012 (BiV ICD) which ultimately was explanted because of pocket infection.  He underwent subcutaneous ICD implantation by Dr. Lalla Brothers which was subsequently explanted as well because of infection.

## 2021-04-05 NOTE — Assessment & Plan Note (Signed)
Recently restarted smoking 10 months ago, currently smoking 1 pack/day, recalcitrant to receptor modification.

## 2021-04-05 NOTE — Assessment & Plan Note (Signed)
History of carotid artery disease status post left internal carotid artery stenting under the "sapphire protocol by myself 10/14/2012.  Recent carotid Doppler studies performed 04/03/2021 revealed the stent to be widely patent.

## 2021-05-05 ENCOUNTER — Other Ambulatory Visit: Payer: Self-pay | Admitting: Cardiovascular Disease

## 2021-05-05 DIAGNOSIS — I255 Ischemic cardiomyopathy: Secondary | ICD-10-CM

## 2021-05-09 ENCOUNTER — Other Ambulatory Visit: Payer: Self-pay | Admitting: Internal Medicine

## 2021-05-12 ENCOUNTER — Other Ambulatory Visit: Payer: Medicare HMO

## 2021-05-12 ENCOUNTER — Other Ambulatory Visit: Payer: Self-pay

## 2021-05-12 DIAGNOSIS — I10 Essential (primary) hypertension: Secondary | ICD-10-CM

## 2021-05-12 DIAGNOSIS — E782 Mixed hyperlipidemia: Secondary | ICD-10-CM | POA: Diagnosis not present

## 2021-05-12 LAB — CBC WITH DIFFERENTIAL/PLATELET
Basophils Absolute: 0.1 10*3/uL (ref 0.0–0.2)
Basos: 1 %
EOS (ABSOLUTE): 0.2 10*3/uL (ref 0.0–0.4)
Eos: 3 %
Hematocrit: 45.6 % (ref 37.5–51.0)
Hemoglobin: 15.5 g/dL (ref 13.0–17.7)
Immature Grans (Abs): 0 10*3/uL (ref 0.0–0.1)
Immature Granulocytes: 1 %
Lymphocytes Absolute: 1.9 10*3/uL (ref 0.7–3.1)
Lymphs: 22 %
MCH: 29.2 pg (ref 26.6–33.0)
MCHC: 34 g/dL (ref 31.5–35.7)
MCV: 86 fL (ref 79–97)
Monocytes Absolute: 0.5 10*3/uL (ref 0.1–0.9)
Monocytes: 6 %
Neutrophils Absolute: 5.7 10*3/uL (ref 1.4–7.0)
Neutrophils: 67 %
Platelets: 363 10*3/uL (ref 150–450)
RBC: 5.31 x10E6/uL (ref 4.14–5.80)
RDW: 12.8 % (ref 11.6–15.4)
WBC: 8.3 10*3/uL (ref 3.4–10.8)

## 2021-05-12 LAB — CMP14+EGFR
ALT: 17 IU/L (ref 0–44)
AST: 22 IU/L (ref 0–40)
Albumin/Globulin Ratio: 1.8 (ref 1.2–2.2)
Albumin: 5.3 g/dL — ABNORMAL HIGH (ref 3.8–4.8)
Alkaline Phosphatase: 66 IU/L (ref 44–121)
BUN/Creatinine Ratio: 11 (ref 10–24)
BUN: 13 mg/dL (ref 8–27)
Bilirubin Total: 0.4 mg/dL (ref 0.0–1.2)
CO2: 22 mmol/L (ref 20–29)
Calcium: 10.6 mg/dL — ABNORMAL HIGH (ref 8.6–10.2)
Chloride: 100 mmol/L (ref 96–106)
Creatinine, Ser: 1.15 mg/dL (ref 0.76–1.27)
Globulin, Total: 3 g/dL (ref 1.5–4.5)
Glucose: 108 mg/dL — ABNORMAL HIGH (ref 65–99)
Potassium: 4.9 mmol/L (ref 3.5–5.2)
Sodium: 138 mmol/L (ref 134–144)
Total Protein: 8.3 g/dL (ref 6.0–8.5)
eGFR: 70 mL/min/{1.73_m2} (ref >59)

## 2021-05-12 LAB — LIPID PANEL
Chol/HDL Ratio: 7 ratio — ABNORMAL HIGH (ref 0.0–5.0)
Cholesterol, Total: 189 mg/dL (ref 100–199)
HDL: 27 mg/dL — ABNORMAL LOW (ref >39)
LDL Chol Calc (NIH): 97 mg/dL (ref 0–99)
Triglycerides: 388 mg/dL — ABNORMAL HIGH (ref 0–149)
VLDL Cholesterol Cal: 65 mg/dL — ABNORMAL HIGH (ref 5–40)

## 2021-05-15 ENCOUNTER — Other Ambulatory Visit: Payer: Self-pay

## 2021-05-15 ENCOUNTER — Encounter: Payer: Self-pay | Admitting: Family Medicine

## 2021-05-15 ENCOUNTER — Ambulatory Visit (INDEPENDENT_AMBULATORY_CARE_PROVIDER_SITE_OTHER): Payer: Medicare HMO | Admitting: Family Medicine

## 2021-05-15 VITALS — BP 138/82 | HR 67 | Ht 70.0 in | Wt 166.0 lb

## 2021-05-15 DIAGNOSIS — F5101 Primary insomnia: Secondary | ICD-10-CM

## 2021-05-15 DIAGNOSIS — R69 Illness, unspecified: Secondary | ICD-10-CM | POA: Diagnosis not present

## 2021-05-15 DIAGNOSIS — I10 Essential (primary) hypertension: Secondary | ICD-10-CM

## 2021-05-15 DIAGNOSIS — K219 Gastro-esophageal reflux disease without esophagitis: Secondary | ICD-10-CM

## 2021-05-15 DIAGNOSIS — Z125 Encounter for screening for malignant neoplasm of prostate: Secondary | ICD-10-CM

## 2021-05-15 DIAGNOSIS — E782 Mixed hyperlipidemia: Secondary | ICD-10-CM | POA: Diagnosis not present

## 2021-05-15 MED ORDER — EZETIMIBE 10 MG PO TABS: 10.0000 mg | ORAL_TABLET | Freq: Every day | ORAL | 3 refills | Status: DC

## 2021-05-15 MED ORDER — MIRTAZAPINE 30 MG PO TABS: 30.0000 mg | ORAL_TABLET | Freq: Every day | ORAL | 3 refills | Status: DC

## 2021-05-15 MED ORDER — OMEPRAZOLE 40 MG PO CPDR: 40.0000 mg | DELAYED_RELEASE_CAPSULE | Freq: Every day | ORAL | 3 refills | Status: DC

## 2021-05-15 MED ORDER — FENOFIBRATE 145 MG PO TABS: 145.0000 mg | ORAL_TABLET | Freq: Every day | ORAL | 3 refills | Status: DC

## 2021-05-15 MED ORDER — ROSUVASTATIN CALCIUM 40 MG PO TABS: 40.0000 mg | ORAL_TABLET | Freq: Every day | ORAL | 3 refills | Status: DC

## 2021-05-15 NOTE — Progress Notes (Signed)
BP 138/82   Pulse 67   Ht 5' 10" (1.778 m)   Wt 166 lb (75.3 kg)   SpO2 96%   BMI 23.82 kg/m    Subjective:   Patient ID: Jonathon Ryan, male    DOB: 04-10-1956, 65 y.o.   MRN: [id number]  HPI: Jonathon Ryan is a 65 y.o. male presenting on 05/15/2021 for Medical Management of Chronic Issues and Hypertension   HPI Hyperlipidemia Patient is coming in for recheck of his hyperlipidemia. The patient is currently taking crestor and fenofibrate, never started Zetia, has been trying diet and has brought the LDL down but did increase his soda intake which increase the fenofibrate and his blood sugar. They deny any issues with myalgias or history of liver damage from it. They deny any focal numbness or weakness or chest pain.   Hypertension Patient is currently on lisinopril and carvedilol, and their blood pressure today is 138/82. Patient denies any lightheadedness or dizziness. Patient denies headaches, blurred vision, chest pains, shortness of breath, or weakness. Denies any side effects from medication and is content with current medication.   GERD Patient is currently on omeprazole.  She denies any major symptoms or abdominal pain or belching or burping. She denies any blood in her stool or lightheadedness or dizziness.   Relevant past medical, surgical, family and social history reviewed and updated as indicated. Interim medical history since our last visit reviewed. Allergies and medications reviewed and updated.  Review of Systems  Constitutional:  Negative for chills and fever.  Eyes:  Negative for visual disturbance.  Respiratory:  Negative for shortness of breath and wheezing.   Cardiovascular:  Negative for chest pain and leg swelling.  Musculoskeletal:  Negative for back pain and gait problem.  Skin:  Negative for rash.  Neurological:  Negative for dizziness, weakness and light-headedness.  All other systems reviewed and are negative.  Per HPI unless specifically indicated  above   Allergies as of 05/15/2021   No Known Allergies      Medication List        Accurate as of May 15, 2021  8:55 AM. If you have any questions, ask your nurse or doctor.          STOP taking these medications    acetaminophen 325 MG tablet Commonly known as: TYLENOL Stopped by: Fransisca Kaufmann Dettinger, MD       TAKE these medications    aspirin 81 MG tablet Take 1 tablet (81 mg total) by mouth daily.   carvedilol 25 MG tablet Commonly known as: COREG TAKE 1 TABLET BY MOUTH TWICE DAILY WITH MEALS   clopidogrel 75 MG tablet Commonly known as: PLAVIX Take 1 tablet by mouth once daily   ezetimibe 10 MG tablet Commonly known as: Zetia Take 1 tablet (10 mg total) by mouth daily.   fenofibrate 145 MG tablet Commonly known as: TRICOR Take 1 tablet (145 mg total) by mouth daily.   lisinopril 10 MG tablet Commonly known as: ZESTRIL Take 1 tablet by mouth once daily   mirtazapine 30 MG tablet Commonly known as: REMERON Take 1 tablet (30 mg total) by mouth at bedtime.   omeprazole 40 MG capsule Commonly known as: PRILOSEC Take 1 capsule (40 mg total) by mouth daily.   rosuvastatin 40 MG tablet Commonly known as: CRESTOR Take 1 tablet (40 mg total) by mouth daily.         Objective:   BP 138/82   Pulse 67  Ht 5' 10" (1.778 m)   Wt 166 lb (75.3 kg)   SpO2 96%   BMI 23.82 kg/m   Wt Readings from Last 3 Encounters:  05/15/21 166 lb (75.3 kg)  04/05/21 166 lb (75.3 kg)  11/14/20 153 lb (69.4 kg)    Physical Exam Vitals and nursing note reviewed.  Constitutional:      General: He is not in acute distress.    Appearance: He is well-developed. He is not diaphoretic.  Eyes:     General: No scleral icterus.    Conjunctiva/sclera: Conjunctivae normal.  Neck:     Thyroid: No thyromegaly.     Vascular: Carotid bruit (Bilateral) present.  Cardiovascular:     Rate and Rhythm: Normal rate and regular rhythm.     Heart sounds: Normal heart sounds.  No murmur heard. Pulmonary:     Effort: Pulmonary effort is normal. No respiratory distress.     Breath sounds: Normal breath sounds. No wheezing.  Musculoskeletal:        General: Normal range of motion.     Cervical back: Neck supple.  Lymphadenopathy:     Cervical: No cervical adenopathy.  Skin:    General: Skin is warm and dry.     Findings: No rash.  Neurological:     Mental Status: He is alert and oriented to person, place, and time.     Coordination: Coordination normal.  Psychiatric:        Behavior: Behavior normal.    Results for orders placed or performed in visit on 05/12/21  CBC with Differential/Platelet  Result Value Ref Range   WBC 8.3 3.4 - 10.8 x10E3/uL   RBC 5.31 4.14 - 5.80 x10E6/uL   Hemoglobin 15.5 13.0 - 17.7 g/dL   Hematocrit 45.6 37.5 - 51.0 %   MCV 86 79 - 97 fL   MCH 29.2 26.6 - 33.0 pg   MCHC 34.0 31.5 - 35.7 g/dL   RDW 12.8 11.6 - 15.4 %   Platelets 363 150 - 450 x10E3/uL   Neutrophils 67 Not Estab. %   Lymphs 22 Not Estab. %   Monocytes 6 Not Estab. %   Eos 3 Not Estab. %   Basos 1 Not Estab. %   Neutrophils Absolute 5.7 1.4 - 7.0 x10E3/uL   Lymphocytes Absolute 1.9 0.7 - 3.1 x10E3/uL   Monocytes Absolute 0.5 0.1 - 0.9 x10E3/uL   EOS (ABSOLUTE) 0.2 0.0 - 0.4 x10E3/uL   Basophils Absolute 0.1 0.0 - 0.2 x10E3/uL   Immature Granulocytes 1 Not Estab. %   Immature Grans (Abs) 0.0 0.0 - 0.1 x10E3/uL  CMP14+EGFR  Result Value Ref Range   Glucose 108 (H) 65 - 99 mg/dL   BUN 13 8 - 27 mg/dL   Creatinine, Ser 1.15 0.76 - 1.27 mg/dL   eGFR 70 >59 mL/min/1.73   BUN/Creatinine Ratio 11 10 - 24   Sodium 138 134 - 144 mmol/L   Potassium 4.9 3.5 - 5.2 mmol/L   Chloride 100 96 - 106 mmol/L   CO2 22 20 - 29 mmol/L   Calcium 10.6 (H) 8.6 - 10.2 mg/dL   Total Protein 8.3 6.0 - 8.5 g/dL   Albumin 5.3 (H) 3.8 - 4.8 g/dL   Globulin, Total 3.0 1.5 - 4.5 g/dL   Albumin/Globulin Ratio 1.8 1.2 - 2.2   Bilirubin Total 0.4 0.0 - 1.2 mg/dL   Alkaline  Phosphatase 66 44 - 121 IU/L   AST 22 0 - 40 IU/L   ALT 17  0 - 44 IU/L  Lipid panel  Result Value Ref Range   Cholesterol, Total 189 100 - 199 mg/dL   Triglycerides 388 (H) 0 - 149 mg/dL   HDL 27 (L) >39 mg/dL   VLDL Cholesterol Cal 65 (H) 5 - 40 mg/dL   LDL Chol Calc (NIH) 97 0 - 99 mg/dL   Chol/HDL Ratio 7.0 (H) 0.0 - 5.0 ratio    Assessment & Plan:   Problem List Items Addressed This Visit       Cardiovascular and Mediastinum   Hypertension   Relevant Medications   fenofibrate (TRICOR) 145 MG tablet   rosuvastatin (CRESTOR) 40 MG tablet   ezetimibe (ZETIA) 10 MG tablet     Digestive   GERD (gastroesophageal reflux disease)   Relevant Medications   omeprazole (PRILOSEC) 40 MG capsule     Other   HLD (hyperlipidemia) - Primary   Relevant Medications   fenofibrate (TRICOR) 145 MG tablet   rosuvastatin (CRESTOR) 40 MG tablet   ezetimibe (ZETIA) 10 MG tablet   Other Visit Diagnoses     Primary insomnia       Relevant Medications   mirtazapine (REMERON) 30 MG tablet       Patient has tried diet and exercise, recommended some adjustments to his diet and exercise but did recommend that he start Zetia.  Patient has a history of carotid artery disease and peripheral arterial disease  Follows up with cardio for his carotid artery disease and gets regular scans every year Follow up plan: Return in about 6 months (around 11/15/2021) for Physical and hypertension and cholesterol.  Counseling provided for all of the vaccine components No orders of the defined types were placed in this encounter.   Caryl Pina, MD [facility] 05/15/2021, 8:55 AM

## 2021-08-30 ENCOUNTER — Ambulatory Visit (INDEPENDENT_AMBULATORY_CARE_PROVIDER_SITE_OTHER): Payer: Medicare HMO

## 2021-08-30 VITALS — Ht 70.0 in | Wt 165.0 lb

## 2021-08-30 DIAGNOSIS — Z0000 Encounter for general adult medical examination without abnormal findings: Secondary | ICD-10-CM | POA: Diagnosis not present

## 2021-08-30 NOTE — Progress Notes (Signed)
Subjective:   Jonathon Ryan is a 65 y.o. male who presents for Medicare Annual/Subsequent preventive examination.  Virtual Visit via Telephone Note  I connected with  LOMAX POEHLER on 08/30/21 at 10:30 AM EST by telephone and verified that I am speaking with the correct person using two identifiers.  Location: Patient: Home Provider: [facility] Persons participating in the virtual visit: patient/Nurse Health Advisor   I discussed the limitations, risks, security and privacy concerns of performing an evaluation and management service by telephone and the availability of in person appointments. The patient expressed understanding and agreed to proceed.  Interactive audio and video telecommunications were attempted between this nurse and patient, however failed, due to patient having technical difficulties OR patient did not have access to video capability.  We continued and completed visit with audio only.  Some vital signs may be absent or patient reported.   Rosalva Neary E Caine Barfield, LPN   Review of Systems     Cardiac Risk Factors include: advanced age (>27men, >68 women);male gender;dyslipidemia;hypertension;family history of premature cardiovascular disease;Other (see comment);smoking/ tobacco exposure, Risk factor comments: CAD, hx of sudden cardiac death, renal artery stenosis     Objective:    Today's Vitals   08/30/21 1036  Weight: 165 lb (74.8 kg)  Height: 5\' 10"  (1.778 m)   Body mass index is 23.68 kg/m.  Advanced Directives 08/30/2021 08/29/2020 07/13/2020 07/13/2020 08/27/2019 08/29/2015 08/18/2015  Does Patient Have a Medical Advance Directive? No No No No No No No  Would patient like information on creating a medical advance directive? No - Patient declined Yes (MAU/Ambulatory/Procedural Areas - Information given) No - Patient declined No - Patient declined No - Patient declined No - patient declined information No - patient declined information  Pre-existing out of facility DNR  order (yellow form or pink MOST form) - - - - - - -    Current Medications (verified) Outpatient Encounter Medications as of 08/30/2021  Medication Sig   aspirin 81 MG tablet Take 1 tablet (81 mg total) by mouth daily.   carvedilol (COREG) 25 MG tablet TAKE 1 TABLET BY MOUTH TWICE DAILY WITH MEALS   clopidogrel (PLAVIX) 75 MG tablet Take 1 tablet by mouth once daily   ezetimibe (ZETIA) 10 MG tablet Take 1 tablet (10 mg total) by mouth daily.   fenofibrate (TRICOR) 145 MG tablet Take 1 tablet (145 mg total) by mouth daily.   lisinopril (ZESTRIL) 10 MG tablet Take 1 tablet by mouth once daily   mirtazapine (REMERON) 30 MG tablet Take 1 tablet (30 mg total) by mouth at bedtime.   omeprazole (PRILOSEC) 40 MG capsule Take 1 capsule (40 mg total) by mouth daily.   rosuvastatin (CRESTOR) 40 MG tablet Take 1 tablet (40 mg total) by mouth daily.   No facility-administered encounter medications on file as of 08/30/2021.    Allergies (verified) Patient has no known allergies.   History: Past Medical History:  Diagnosis Date   Anxiety    CAD (coronary artery disease) 9/13   s/p CABG   Cardiac arrest due to underlying cardiac condition (HCC)    successfully rescucitated/ cooled   Cardiac defibrillator in place    Cerebrovascular disease 10/15/2012   Chronic systolic dysfunction of left ventricle    GERD (gastroesophageal reflux disease)    Headache(784.0)    Hyperlipidemia    Hypertension    Infection of pacemaker pocket (HCC)    of ICD with subsequent explant   Ischemic cardiomyopathy  s/p BiV ICD implant Feb 2014 - [address]. [name] [place], [place] [id number]-04 (serial # [id number]) right atrial lead and a [name] [place], model 5409-81X (serial number [id number]) right ventricular defibrillator lead. [name] Quartet model [phone] [phone] (serial number [id number]) LV lead. [name] Quadra [place]ura model [place] 2956 [phone] (serial Number [id number]) biventricular ICD.    Left bundle  branch block    Peripheral arterial disease (HCC)    PONV (postoperative nausea and vomiting)    Renal artery stenosis (HCC)    Shortness of breath dyspnea    with exertion   Spinal headache    Past Surgical History:  Procedure Laterality Date   BI-VENTRICULAR IMPLANTABLE CARDIOVERTER DEFIBRILLATOR [name] 11/25/2012   Procedure: BI-VENTRICULAR IMPLANTABLE CARDIOVERTER DEFIBRILLATOR  (CRT-D);  Surgeon: Hillis Range, MD;  Location: [facility] CATH LAB;  Service: Cardiovascular;  Laterality: [name];   CAROTID ANGIOGRAM Bilateral 10/14/2012   Procedure: CAROTID ANGIOGRAM;  Surgeon: Nada Libman, MD;  Location: [facility] CATH LAB;  Service: Cardiovascular;  Laterality: Bilateral;   CAROTID STENT INSERTION [name] 10/14/2012   Procedure: CAROTID STENT INSERTION;  Surgeon: Nada Libman, MD;  Location: [facility] CATH LAB;  Service: Cardiovascular;  Laterality: [name];   COLONOSCOPY [name] 08/18/2015   Procedure: COLONOSCOPY;  Surgeon: Malissa Hippo, MD;  Location: [facility] ENDO SUITE;  Service: Endoscopy;  Laterality: [name];  830   CORONARY ARTERY BYPASS GRAFT  06/11/2012   Procedure: CORONARY ARTERY BYPASS GRAFTING (CABG);  Surgeon: [name], MD;  Location: [facility] OR;  Service: Open Heart Surgery;  Laterality: [name];  Coronary Artery Bypass Grafting times four using left internal mammary artery and right greater saphenous vein endoscopically harvested.   EP IMPLANTABLE DEVICE [name] 03/24/2015   Procedure: SubQ ICD Implant;  Surgeon: Marinus Maw, MD;  Location: [facility] INVASIVE CV LAB;  Service: Cardiovascular;  Laterality: [name];   ICD LEAD REMOVAL Left 10/04/2014   Procedure: REMOVAL Saint Jude ICD with lead;  Surgeon: Marinus Maw, MD;  Location: [facility] OR;  Service: Cardiovascular;  Laterality: Left;  [name] is back up for [facility] CATHETERIZATION WITH CORONARY ANGIOGRAM [name] 06/11/2012   Procedure: LEFT HEART CATHETERIZATION WITH CORONARY ANGIOGRAM;  Surgeon: Runell Gess, MD;  Location: [facility] CATH LAB;  Service: Cardiovascular;   Laterality: [name];   PERCUTANEOUS PLACEMENT INTRAVASCULAR STENT CERVICAL CAROTID ARTERY     SUBQ ICD CHANGEOUT [name] 07/13/2020   Procedure: SUBQ ICD EXPLANT;  Surgeon: Lanier Prude, MD;  Location: [facility] INVASIVE CV LAB;  Service: Cardiovascular;  Laterality: [name];   Family History  Problem Relation Age of Onset   Cancer Half-Brother        prostate   Cancer Half-Brother        mouth   Social History   Socioeconomic History   Marital status: Married    Spouse name: Hoberg Lions   Number of children: 2   Years of education: 10   Highest education level: GED or equivalent  Occupational History   Occupation: disability  Tobacco Use   Smoking status: Every Day    Packs/day: 1.00    Years: 40.00    Pack years: 40.00    Types: Cigarettes   Smokeless tobacco: Never   Tobacco comments:    Quit 2013, started back; now 1 ppd  Vaping Use   Vaping Use: Never used  Substance and Sexual Activity   Alcohol use: Yes    Alcohol/week: 14.0 standard drinks    Types: 14 Cans  of beer per week    Comment: couple of drinks most days   Drug use: Yes    Frequency: 2.0 times per week    Types: Marijuana    Comment: 2 per week   Sexual activity: Yes    Comment: married and 2 step-daughters  Other Topics Concern   Not on file  Social History Narrative   Lives in one level home with his wife.   Bought an older home to remodel as a project since he retired - this keeps him busy and active   Social Determinants of Community education officer: Low Risk    Difficulty of Paying Living Expenses: Not hard at all  Food Insecurity: No Food Insecurity   Worried About Programme researcher, broadcasting/film/video in the Last Year: Never true   Barista in the Last Year: Never true  Transportation Needs: No Transportation Needs   Lack of Transportation (Medical): No   Lack of Transportation (Non-Medical): No  Physical Activity: Sufficiently Active   Days of Exercise per Week: 5 days   Minutes of Exercise per Session:  30 min  Stress: No Stress Concern Present   Feeling of Stress : Not at all  Social Connections: Moderately Integrated   Frequency of Communication with Friends and Family: Twice a week   Frequency of Social Gatherings with Friends and Family: Twice a week   Attends Religious Services: Never   Database administrator or Organizations: Yes   Attends Engineer, structural: 1 to 4 times per year   Marital Status: Married    Tobacco Counseling Ready to quit: Not Answered Counseling given: Yes Tobacco comments: Quit 2013, started back; now 1 ppd   Clinical Intake:  Pre-visit preparation completed: Yes  Pain : No/denies pain     BMI - recorded: 23.68 Nutritional Status: BMI of 19-24  Normal Nutritional Risks: None Diabetes: No  How often do you need to have someone help you when you read instructions, pamphlets, or other written materials from your doctor or pharmacy?: 1 - Never  Diabetic? no  Interpreter Needed?: No  Information entered by :: Steffi Noviello, LPN   Activities of Daily Living In your present state of health, do you have any difficulty performing the following activities: 08/30/2021  Hearing? N  Vision? N  Difficulty concentrating or making decisions? N  Walking or climbing stairs? N  Dressing or bathing? N  Doing errands, shopping? N  Preparing Food and eating ? N  Using the Toilet? N  In the past six months, have you accidently leaked urine? N  Do you have problems with loss of bowel control? N  Managing your Medications? N  Managing your Finances? N  Housekeeping or managing your Housekeeping? N  Some recent data might be hidden    Patient Care Team: Dettinger, Elige Radon, MD as PCP - General (Family Medicine) Marinus Maw, MD as PCP - Electrophysiology (Cardiology) Donata Clay, Theron Arista, MD (Inactive) as Attending Physician (Cardiothoracic Surgery) Runell Gess, MD as Attending Physician (Cardiology)  Indicate any recent Medical  Services you may have received from other than [facility] providers in the past year (date may be approximate).     Assessment:   This is a routine wellness examination for Bray.  Hearing/Vision screen Hearing Screening - Comments:: Denies hearing difficulties  Vision Screening - Comments:: Wears rx glasses. Up to date with annual eye exams with [facility]  Dietary issues and exercise activities discussed:  Current Exercise Habits: Home exercise routine, Type of exercise: walking;Other - see comments (working on remodeling older home), Time (Minutes): 60, Frequency (Times/Week): 5, Weekly Exercise (Minutes/Week): 300, Intensity: Moderate, Exercise limited by: cardiac condition(s)   Goals Addressed             This Visit's Progress    DIET - INCREASE WATER INTAKE   On track    Try to drink 6-8 glasses of water daily     Exercise 3x per week (30 min per time)   On track    08/29/2020 AWV Goal: Exercise for General Health  Patient will verbalize understanding of the benefits of increased physical activity: Exercising regularly is important. It will improve your overall fitness, flexibility, and endurance. Regular exercise also will improve your overall health. It can help you control your weight, reduce stress, and improve your bone density. Over the next year, patient will increase physical activity as tolerated with a goal of at least 150 minutes of moderate physical activity per week.  You can tell that you are exercising at a moderate intensity if your heart starts beating faster and you start breathing faster but can still hold a conversation. Moderate-intensity exercise ideas include: Walking 1 mile (1.6 km) in about 15 minutes Biking Hiking Golfing Dancing Water aerobics Patient will verbalize understanding of everyday activities that increase physical activity by providing examples like the following: Yard work, such as: Therapist, occupational Gardening Washing windows or floors Patient will be able to explain general safety guidelines for exercising:  Before you start a new exercise program, talk with your health care provider. Do not exercise so much that you hurt yourself, feel dizzy, or get very short of breath. Wear comfortable clothes and wear shoes with good support. Drink plenty of water while you exercise to prevent dehydration or heat stroke. Work out until your breathing and your heartbeat get faster.        Depression Screen PHQ 2/9 Scores 08/30/2021 05/15/2021 11/14/2020 08/29/2020 08/11/2020 08/27/2019 08/26/2019  PHQ - 2 Score 0 0 0 0 0 0 0    Fall Risk Fall Risk  08/30/2021 05/15/2021 11/14/2020 08/29/2020 08/11/2020  Falls in the past year? 0 0 0 0 0  Number falls in past yr: 0 - - 0 0  Injury with Fall? 0 - - 0 0  Risk for fall due to : No Fall Risks - - No Fall Risks -  Follow up Falls prevention discussed - - Falls evaluation completed Falls evaluation completed    FALL RISK PREVENTION PERTAINING TO THE HOME:  Any stairs in or around the home? No  If so, are there any without handrails? No  Home free of loose throw rugs in walkways, pet beds, electrical cords, etc? Yes  Adequate lighting in your home to reduce risk of falls? Yes   ASSISTIVE DEVICES UTILIZED TO PREVENT FALLS:  Life alert? No  Use of a cane, walker or w/c? No  Grab bars in the bathroom? Yes  Shower chair or bench in shower? Yes  Elevated toilet seat or a handicapped toilet? Yes   TIMED UP AND GO:  Was the test performed? No . Telephonic visit  Cognitive Function: Normal cognitive status assessed by direct observation by this Nurse Health Advisor. No abnormalities found.       6CIT Screen 08/29/2020 08/27/2019  What Year? 0 points 0 points  What month? 0 points  0 points  What time? 0 points 0 points  Count back from 20 0 points 0 points  Months in reverse 0 points 0  points  Repeat phrase 0 points 0 points  Total Score 0 0    Immunizations Immunization History  Administered Date(s) Administered   Fluad Quad(high Dose 65+) 07/14/2020   Influenza Split 06/18/2012, 07/08/2014, 07/17/2017   Influenza, High Dose Seasonal PF 07/28/2018   Influenza, Quadrivalent, Recombinant, Inj, Pf 07/22/2019   Influenza,inj,Quad PF,6+ Mos 07/28/2018   Moderna SARS-COV2 Booster Vaccination 01/23/2021   Moderna Sars-Covid-2 Vaccination 12/28/2019, 01/25/2020, 08/31/2020   Pneumococcal Conjugate-13 11/14/2020   Pneumococcal Polysaccharide-23 10/09/2011, 06/17/2012   Tdap 10/09/2011    TDAP status: Up to date  Flu Vaccine status: Up to date  Pneumococcal vaccine status: Up to date  Covid-19 vaccine status: Completed vaccines  Qualifies for Shingles Vaccine? Yes   Zostavax completed No   Shingrix Completed?: No.    Education has been provided regarding the importance of this vaccine. Patient has been advised to call insurance company to determine out of pocket expense if they have not yet received this vaccine. Advised may also receive vaccine at local pharmacy or Health Dept. Verbalized acceptance and understanding.  Screening Tests Health Maintenance  Topic Date Due   COVID-19 Vaccine (4 - Booster for Moderna series) 03/20/2021   INFLUENZA VACCINE  05/08/2021   Pneumonia Vaccine 53+ Years old (3 - PPSV23 if available, else PCV20) 11/14/2021   Zoster Vaccines- Shingrix (1 of 2) 08/13/2022 (Originally 12/12/2004)   TETANUS/TDAP  10/08/2021   COLONOSCOPY (Pts 45-9yrs Insurance coverage will need to be confirmed)  08/17/2025   Hepatitis C Screening  Completed   HPV VACCINES  Aged Out    Health Maintenance  Health Maintenance Due  Topic Date Due   COVID-19 Vaccine (4 - Booster for Moderna series) 03/20/2021   INFLUENZA VACCINE  05/08/2021   Pneumonia Vaccine 52+ Years old (3 - PPSV23 if available, else PCV20) 11/14/2021    Colorectal cancer screening:  Type of screening: Colonoscopy. Completed 08/18/2015. Repeat every 10 years  Lung Cancer Screening: (Low Dose CT Chest recommended if Age 47-80 years, 30 pack-year currently smoking OR have quit w/in 15years.) does not qualify  Additional Screening:  Hepatitis C Screening: does qualify; Completed 08/26/2019  Vision Screening: Recommended annual ophthalmology exams for early detection of glaucoma and other disorders of the eye. Is the patient up to date with their annual eye exam?  Yes  Who is the provider or what is the name of the office in which the patient attends annual eye exams? [facility] If pt is not established with a provider, would they like to be referred to a provider to establish care? No .   Dental Screening: Recommended annual dental exams for proper oral hygiene  Community Resource Referral / Chronic Care Management: CRR required this visit?  No   CCM required this visit?  No      Plan:     I have personally reviewed and noted the following in the patient's chart:   Medical and social history Use of alcohol, tobacco or illicit drugs  Current medications and supplements including opioid prescriptions. Patient is not currently taking opioid prescriptions. Functional ability and status Nutritional status Physical activity Advanced directives List of other physicians Hospitalizations, surgeries, and ER visits in previous 12 months Vitals Screenings to include cognitive, depression, and falls Referrals and appointments  In addition, I have reviewed and discussed with patient certain preventive protocols, quality  metrics, and best practice recommendations. A written personalized care plan for preventive services as well as general preventive health recommendations were provided to patient.     [place] Constable, LPN   [id number]   Nurse Notes: None

## 2021-08-30 NOTE — Patient Instructions (Signed)
Jonathon Ryan , Thank you for taking time to come for your Medicare Wellness Visit. I appreciate your ongoing commitment to your health goals. Please review the following plan we discussed and let me know if I can assist you in the future.   Screening recommendations/referrals: Colonoscopy: Done 08/18/2015 - Repeat in 10 years Recommended yearly ophthalmology/optometry visit for glaucoma screening and checkup Recommended yearly dental visit for hygiene and checkup  Vaccinations: Influenza vaccine: Done 08/08/2021 - Repeat annually  Pneumococcal vaccine: Done 06/17/2012 & 11/14/2020 Tdap vaccine: Done 2013 - Repeat in 10 years Shingles vaccine: Due.   Covid-19: Done 12/28/2019, 01/25/2020, 08/31/2020, & 01/23/2021  Advanced directives: Advance directive discussed with you today. Even though you declined this today, please call our office should you change your mind, and we can give you the proper paperwork for you to fill out.   Conditions/risks identified: Aim for 30 minutes of exercise or brisk walking each day, drink 6-8 glasses of water and eat lots of fruits and vegetables.   Next appointment: Follow up in one year for your annual wellness visit.   Preventive Care 81 Years and Older, Male  Preventive care refers to lifestyle choices and visits with your health care provider that can promote health and wellness. What does preventive care include? A yearly physical exam. This is also called an annual well check. Dental exams once or twice a year. Routine eye exams. Ask your health care provider how often you should have your eyes checked. Personal lifestyle choices, including: Daily care of your teeth and gums. Regular physical activity. Eating a healthy diet. Avoiding tobacco and drug use. Limiting alcohol use. Practicing safe sex. Taking low doses of aspirin every day. Taking vitamin and mineral supplements as recommended by your health care provider. What happens during an annual well  check? The services and screenings done by your health care provider during your annual well check will depend on your age, overall health, lifestyle risk factors, and family history of disease. Counseling  Your health care provider may ask you questions about your: Alcohol use. Tobacco use. Drug use. Emotional well-being. Home and relationship well-being. Sexual activity. Eating habits. History of falls. Memory and ability to understand (cognition). Work and work Astronomer. Screening  You may have the following tests or measurements: Height, weight, and BMI. Blood pressure. Lipid and cholesterol levels. These may be checked every 5 years, or more frequently if you are over 20 years old. Skin check. Lung cancer screening. You may have this screening every year starting at age 45 if you have a 30-pack-year history of smoking and currently smoke or have quit within the past 15 years. Fecal occult blood test (FOBT) of the stool. You may have this test every year starting at age 72. Flexible sigmoidoscopy or colonoscopy. You may have a sigmoidoscopy every 5 years or a colonoscopy every 10 years starting at age 76. Prostate cancer screening. Recommendations will vary depending on your family history and other risks. Hepatitis C blood test. Hepatitis B blood test. Sexually transmitted disease (STD) testing. Diabetes screening. This is done by checking your blood sugar (glucose) after you have not eaten for a while (fasting). You may have this done every 1-3 years. Abdominal aortic aneurysm (AAA) screening. You may need this if you are a current or former smoker. Osteoporosis. You may be screened starting at age 62 if you are at high risk. Talk with your health care provider about your test results, treatment options, and if necessary, the need  for more tests. Vaccines  Your health care provider may recommend certain vaccines, such as: Influenza vaccine. This is recommended every  year. Tetanus, diphtheria, and acellular pertussis (Tdap, Td) vaccine. You may need a Td booster every 10 years. Zoster vaccine. You may need this after age 79. Pneumococcal 13-valent conjugate (PCV13) vaccine. One dose is recommended after age 66. Pneumococcal polysaccharide (PPSV23) vaccine. One dose is recommended after age 70. Talk to your health care provider about which screenings and vaccines you need and how often you need them. This information is not intended to replace advice given to you by your health care provider. Make sure you discuss any questions you have with your health care provider. Document Released: 10/21/2015 Document Revised: 06/13/2016 Document Reviewed: 07/26/2015 Elsevier Interactive Patient Education  2017 [place] Prevention in the Home Falls can cause injuries. They can happen to people of all ages. There are many things you can do to make your home safe and to help prevent falls. What can I do on the outside of my home? Regularly fix the edges of walkways and driveways and fix any cracks. Remove anything that might make you trip as you walk through a door, such as a raised step or threshold. Trim any bushes or trees on the path to your home. Use bright outdoor lighting. Clear any walking paths of anything that might make someone trip, such as rocks or tools. Regularly check to see if handrails are loose or broken. Make sure that both sides of any steps have handrails. Any raised decks and porches should have guardrails on the edges. Have any leaves, snow, or ice cleared regularly. Use sand or salt on walking paths during winter. Clean up any spills in your garage right away. This includes oil or grease spills. What can I do in the bathroom? Use night lights. Install grab bars by the toilet and in the tub and shower. Do not use towel bars as grab bars. Use non-skid mats or decals in the tub or shower. If you need to sit down in the shower, use a  plastic, non-slip stool. Keep the floor dry. Clean up any water that spills on the floor as soon as it happens. Remove soap buildup in the tub or shower regularly. Attach bath mats securely with double-sided non-slip rug tape. Do not have throw rugs and other things on the floor that can make you trip. What can I do in the bedroom? Use night lights. Make sure that you have a light by your bed that is easy to reach. Do not use any sheets or blankets that are too big for your bed. They should not hang down onto the floor. Have a firm chair that has side arms. You can use this for support while you get dressed. Do not have throw rugs and other things on the floor that can make you trip. What can I do in the kitchen? Clean up any spills right away. Avoid walking on wet floors. Keep items that you use a lot in easy-to-reach places. If you need to reach something above you, use a strong step stool that has a grab bar. Keep electrical cords out of the way. Do not use floor polish or wax that makes floors slippery. If you must use wax, use non-skid floor wax. Do not have throw rugs and other things on the floor that can make you trip. What can I do with my stairs? Do not leave any items on the stairs.  Make sure that there are handrails on both sides of the stairs and use them. Fix handrails that are broken or loose. Make sure that handrails are as long as the stairways. Check any carpeting to make sure that it is firmly attached to the stairs. Fix any carpet that is loose or worn. Avoid having throw rugs at the top or bottom of the stairs. If you do have throw rugs, attach them to the floor with carpet tape. Make sure that you have a light switch at the top of the stairs and the bottom of the stairs. If you do not have them, ask someone to add them for you. What else can I do to help prevent falls? Wear shoes that: Do not have high heels. Have rubber bottoms. Are comfortable and fit you  well. Are closed at the toe. Do not wear sandals. If you use a stepladder: Make sure that it is fully opened. Do not climb a closed stepladder. Make sure that both sides of the stepladder are locked into place. Ask someone to hold it for you, if possible. Clearly mark and make sure that you can see: Any grab bars or handrails. First and last steps. Where the edge of each step is. Use tools that help you move around (mobility aids) if they are needed. These include: Canes. Walkers. Scooters. Crutches. Turn on the lights when you go into a dark area. Replace any light bulbs as soon as they burn out. Set up your furniture so you have a clear path. Avoid moving your furniture around. If any of your floors are uneven, fix them. If there are any pets around you, be aware of where they are. Review your medicines with your doctor. Some medicines can make you feel dizzy. This can increase your chance of falling. Ask your doctor what other things that you can do to help prevent falls. This information is not intended to replace advice given to you by your health care provider. Make sure you discuss any questions you have with your health care provider. Document Released: 07/21/2009 Document Revised: 03/01/2016 Document Reviewed: 10/29/2014 Elsevier Interactive Patient Education  2017 [employer].

## 2021-09-11 ENCOUNTER — Other Ambulatory Visit: Payer: Self-pay | Admitting: Family Medicine

## 2021-09-11 DIAGNOSIS — E782 Mixed hyperlipidemia: Secondary | ICD-10-CM

## 2021-11-08 ENCOUNTER — Other Ambulatory Visit: Payer: Self-pay | Admitting: Family Medicine

## 2021-11-08 DIAGNOSIS — K219 Gastro-esophageal reflux disease without esophagitis: Secondary | ICD-10-CM

## 2021-11-13 ENCOUNTER — Other Ambulatory Visit: Payer: Medicare HMO

## 2021-11-13 DIAGNOSIS — E782 Mixed hyperlipidemia: Secondary | ICD-10-CM | POA: Diagnosis not present

## 2021-11-13 DIAGNOSIS — K219 Gastro-esophageal reflux disease without esophagitis: Secondary | ICD-10-CM

## 2021-11-13 DIAGNOSIS — Z125 Encounter for screening for malignant neoplasm of prostate: Secondary | ICD-10-CM | POA: Diagnosis not present

## 2021-11-13 DIAGNOSIS — I10 Essential (primary) hypertension: Secondary | ICD-10-CM

## 2021-11-13 LAB — LIPID PANEL

## 2021-11-14 LAB — CBC WITH DIFFERENTIAL/PLATELET
Basophils Absolute: 0 10*3/uL (ref 0.0–0.2)
Basos: 1 %
EOS (ABSOLUTE): 0.4 10*3/uL (ref 0.0–0.4)
Eos: 6 %
Hematocrit: 45.6 % (ref 37.5–51.0)
Hemoglobin: 15.4 g/dL (ref 13.0–17.7)
Immature Grans (Abs): 0 10*3/uL (ref 0.0–0.1)
Immature Granulocytes: 1 %
Lymphocytes Absolute: 2.2 10*3/uL (ref 0.7–3.1)
Lymphs: 28 %
MCH: 29.3 pg (ref 26.6–33.0)
MCHC: 33.8 g/dL (ref 31.5–35.7)
MCV: 87 fL (ref 79–97)
Monocytes Absolute: 0.5 10*3/uL (ref 0.1–0.9)
Monocytes: 7 %
Neutrophils Absolute: 4.7 10*3/uL (ref 1.4–7.0)
Neutrophils: 57 %
Platelets: 372 10*3/uL (ref 150–450)
RBC: 5.25 x10E6/uL (ref 4.14–5.80)
RDW: 12.9 % (ref 11.6–15.4)
WBC: 8 10*3/uL (ref 3.4–10.8)

## 2021-11-14 LAB — PSA, TOTAL AND FREE
PSA, Free Pct: 34.5 %
PSA, Free: 0.38 ng/mL
Prostate Specific Ag, Serum: 1.1 ng/mL (ref 0.0–4.0)

## 2021-11-14 LAB — CMP14+EGFR
ALT: 14 IU/L (ref 0–44)
AST: 20 IU/L (ref 0–40)
Albumin/Globulin Ratio: 1.8 (ref 1.2–2.2)
Albumin: 4.8 g/dL (ref 3.8–4.8)
Alkaline Phosphatase: 83 IU/L (ref 44–121)
BUN/Creatinine Ratio: 10 (ref 10–24)
BUN: 14 mg/dL (ref 8–27)
Bilirubin Total: 0.3 mg/dL (ref 0.0–1.2)
CO2: 22 mmol/L (ref 20–29)
Calcium: 10.8 mg/dL — ABNORMAL HIGH (ref 8.6–10.2)
Chloride: 101 mmol/L (ref 96–106)
Creatinine, Ser: 1.34 mg/dL — ABNORMAL HIGH (ref 0.76–1.27)
Globulin, Total: 2.7 g/dL (ref 1.5–4.5)
Glucose: 103 mg/dL — ABNORMAL HIGH (ref 70–99)
Potassium: 5.2 mmol/L (ref 3.5–5.2)
Sodium: 138 mmol/L (ref 134–144)
Total Protein: 7.5 g/dL (ref 6.0–8.5)
eGFR: 58 mL/min/{1.73_m2} — ABNORMAL LOW (ref >59)

## 2021-11-14 LAB — LIPID PANEL
Chol/HDL Ratio: 5.7 ratio — ABNORMAL HIGH (ref 0.0–5.0)
Cholesterol, Total: 148 mg/dL (ref 100–199)
HDL: 26 mg/dL — ABNORMAL LOW (ref >39)
LDL Chol Calc (NIH): 64 mg/dL (ref 0–99)
Triglycerides: 373 mg/dL — ABNORMAL HIGH (ref 0–149)
VLDL Cholesterol Cal: 58 mg/dL — ABNORMAL HIGH (ref 5–40)

## 2021-11-15 ENCOUNTER — Ambulatory Visit (INDEPENDENT_AMBULATORY_CARE_PROVIDER_SITE_OTHER): Payer: Medicare HMO | Admitting: Family Medicine

## 2021-11-15 ENCOUNTER — Encounter: Payer: Self-pay | Admitting: Family Medicine

## 2021-11-15 VITALS — BP 132/70 | HR 64 | Ht 70.0 in | Wt 161.0 lb

## 2021-11-15 DIAGNOSIS — Z23 Encounter for immunization: Secondary | ICD-10-CM

## 2021-11-15 DIAGNOSIS — E782 Mixed hyperlipidemia: Secondary | ICD-10-CM

## 2021-11-15 DIAGNOSIS — K219 Gastro-esophageal reflux disease without esophagitis: Secondary | ICD-10-CM | POA: Diagnosis not present

## 2021-11-15 DIAGNOSIS — I10 Essential (primary) hypertension: Secondary | ICD-10-CM

## 2021-11-15 MED ORDER — OMEPRAZOLE 40 MG PO CPDR: 40.0000 mg | DELAYED_RELEASE_CAPSULE | Freq: Every day | ORAL | 3 refills | Status: DC

## 2021-11-15 MED ORDER — ROSUVASTATIN CALCIUM 40 MG PO TABS: 40.0000 mg | ORAL_TABLET | Freq: Every day | ORAL | 3 refills | Status: DC

## 2021-11-15 NOTE — Progress Notes (Signed)
BP 132/70    Pulse 64    Ht _0  (1.778 m)    Wt 161 lb (73 kg)    SpO2 98%    BMI 23.10 kg/m    Subjective:   Patient ID: Jonathon Ryan, male    DOB: March 09, 1956, 66 y.o.   MRN: [id number]  HPI: Jonathon Ryan is a 66 y.o. male presenting on 11/15/2021 for Medical Management of Chronic Issues, Hyperlipidemia, and Hypertension   HPI Hyperlipidemia Patient is coming in for recheck of his hyperlipidemia. The patient is currently taking crestor and fenofibrate and zetia. They deny any issues with myalgias or history of liver damage from it. They deny any focal numbness or weakness or chest pain.  Hypertension Patient is currently on lisinopril and carvediolol, and their blood pressure today is 132/70. Patient denies any lightheadedness or dizziness. Patient denies headaches, blurred vision, chest pains, shortness of breath, or weakness. Denies any side effects from medication and is content with current medication.   GERD Patient is currently on omeprazole.  She denies any major symptoms or abdominal pain or belching or burping. She denies any blood in her stool or lightheadedness or dizziness.   Relevant past medical, surgical, family and social history reviewed and updated as indicated. Interim medical history since our last visit reviewed. Allergies and medications reviewed and updated.  Review of Systems  Constitutional:  Negative for chills and fever.  Eyes:  Negative for visual disturbance.  Respiratory:  Negative for shortness of breath and wheezing.   Cardiovascular:  Negative for chest pain and leg swelling.  Musculoskeletal:  Negative for back pain and gait problem.  Skin:  Negative for rash.  Neurological:  Negative for dizziness, weakness and light-headedness.  All other systems reviewed and are negative.  Per HPI unless specifically indicated above   Allergies as of 11/15/2021   No Known Allergies      Medication List        Accurate as of November 15, 2021 10:15  AM. If you have any questions, ask your nurse or doctor.          aspirin 81 MG tablet Take 1 tablet (81 mg total) by mouth daily.   carvedilol 25 MG tablet Commonly known as: COREG TAKE 1 TABLET BY MOUTH TWICE DAILY WITH MEALS   clopidogrel 75 MG tablet Commonly known as: PLAVIX Take 1 tablet by mouth once daily   ezetimibe 10 MG tablet Commonly known as: Zetia Take 1 tablet (10 mg total) by mouth daily.   fenofibrate 145 MG tablet Commonly known as: TRICOR Take 1 tablet (145 mg total) by mouth daily.   lisinopril 10 MG tablet Commonly known as: ZESTRIL Take 1 tablet by mouth once daily   mirtazapine 30 MG tablet Commonly known as: REMERON Take 1 tablet (30 mg total) by mouth at bedtime.   omeprazole 40 MG capsule Commonly known as: PRILOSEC Take 1 capsule (40 mg total) by mouth daily.   rosuvastatin 40 MG tablet Commonly known as: CRESTOR Take 1 tablet (40 mg total) by mouth daily.         Objective:   BP 132/70    Pulse 64    Ht _1  (1.778 m)    Wt 161 lb (73 kg)    SpO2 98%    BMI 23.10 kg/m   Wt Readings from Last 3 Encounters:  11/15/21 161 lb (73 kg)  08/30/21 165 lb (74.8 kg)  05/15/21 166 lb (75.3 kg)  Physical Exam Vitals and nursing note reviewed.  Constitutional:      General: He is not in acute distress.    Appearance: He is well-developed. He is not diaphoretic.  Eyes:     General: No scleral icterus.    Conjunctiva/sclera: Conjunctivae normal.  Neck:     Thyroid: No thyromegaly.  Cardiovascular:     Rate and Rhythm: Normal rate and regular rhythm.     Heart sounds: Normal heart sounds. No murmur heard. Pulmonary:     Effort: Pulmonary effort is normal. No respiratory distress.     Breath sounds: Normal breath sounds. No wheezing.  Musculoskeletal:        General: No swelling. Normal range of motion.     Cervical back: Neck supple.  Lymphadenopathy:     Cervical: No cervical adenopathy.  Skin:    General: Skin is warm and  dry.     Findings: No rash.  Neurological:     Mental Status: He is alert and oriented to person, place, and time.     Coordination: Coordination normal.  Psychiatric:        Behavior: Behavior normal.    Results for orders placed or performed in visit on 11/13/21  PSA, total and free  Result Value Ref Range   Prostate Specific Ag, Serum 1.1 0.0 - 4.0 ng/mL   PSA, Free 0.38 [name] ng/mL   PSA, Free Pct 34.5 %  Lipid panel  Result Value Ref Range   Cholesterol, Total 148 100 - 199 mg/dL   Triglycerides 373 (H) 0 - 149 mg/dL   HDL 26 (L) >39 mg/dL   VLDL Cholesterol Cal 58 (H) 5 - 40 mg/dL   LDL Chol Calc (NIH) 64 0 - 99 mg/dL   Chol/HDL Ratio 5.7 (H) 0.0 - 5.0 ratio  CMP14+EGFR  Result Value Ref Range   Glucose 103 (H) 70 - 99 mg/dL   BUN 14 8 - 27 mg/dL   Creatinine, Ser 1.34 (H) 0.76 - 1.27 mg/dL   eGFR 58 (L) >59 mL/min/1.73   BUN/Creatinine Ratio 10 10 - 24   Sodium 138 134 - 144 mmol/L   Potassium 5.2 3.5 - 5.2 mmol/L   Chloride 101 96 - 106 mmol/L   CO2 22 20 - 29 mmol/L   Calcium 10.8 (H) 8.6 - 10.2 mg/dL   Total Protein 7.5 6.0 - 8.5 g/dL   Albumin 4.8 3.8 - 4.8 g/dL   Globulin, Total 2.7 1.5 - 4.5 g/dL   Albumin/Globulin Ratio 1.8 1.2 - 2.2   Bilirubin Total 0.3 0.0 - 1.2 mg/dL   Alkaline Phosphatase 83 44 - 121 IU/L   AST 20 0 - 40 IU/L   ALT 14 0 - 44 IU/L  CBC with Differential/Platelet  Result Value Ref Range   WBC 8.0 3.4 - 10.8 x10E3/uL   RBC 5.25 4.14 - 5.80 x10E6/uL   Hemoglobin 15.4 13.0 - 17.7 g/dL   Hematocrit 45.6 37.5 - 51.0 %   MCV 87 79 - 97 fL   MCH 29.3 26.6 - 33.0 pg   MCHC 33.8 31.5 - 35.7 g/dL   RDW 12.9 11.6 - 15.4 %   Platelets 372 150 - 450 x10E3/uL   Neutrophils 57 Not Estab. %   Lymphs 28 Not Estab. %   Monocytes 7 Not Estab. %   Eos 6 Not Estab. %   Basos 1 Not Estab. %   Neutrophils Absolute 4.7 1.4 - 7.0 x10E3/uL   Lymphocytes Absolute 2.2 0.7 -  3.1 x10E3/uL   Monocytes Absolute 0.5 0.1 - 0.9 x10E3/uL   EOS (ABSOLUTE) 0.4  0.0 - 0.4 x10E3/uL   Basophils Absolute 0.0 0.0 - 0.2 x10E3/uL   Immature Granulocytes 1 Not Estab. %   Immature Grans (Abs) 0.0 0.0 - 0.1 x10E3/uL    Assessment & Plan:   Problem List Items Addressed This Visit       Cardiovascular and Mediastinum   Hypertension   Relevant Medications   rosuvastatin (CRESTOR) 40 MG tablet     Digestive   GERD (gastroesophageal reflux disease)   Relevant Medications   omeprazole (PRILOSEC) 40 MG capsule     Other   HLD (hyperlipidemia) - Primary   Relevant Medications   rosuvastatin (CRESTOR) 40 MG tablet   Other Visit Diagnoses     Need for Tdap vaccination       Relevant Orders   Tdap vaccine greater than or equal to 7yo IM   Need for pneumococcal vaccination       Relevant Orders   Pneumococcal conjugate vaccine 20-valent (Prevnar 20)      Continue current medications and no changes.  Follow up plan: Return in about 3 months (around 02/12/2022), or if symptoms worsen or fail to improve, for htn hld.  Counseling provided for all of the vaccine components Orders Placed This Encounter  Procedures   Tdap vaccine greater than or equal to 7yo IM   Pneumococcal conjugate vaccine 20-valent (Prevnar 20)    Caryl Pina, MD [facility] 11/15/2021, 10:15 AM

## 2022-03-28 ENCOUNTER — Other Ambulatory Visit: Payer: Self-pay

## 2022-03-28 ENCOUNTER — Other Ambulatory Visit: Payer: Self-pay | Admitting: Cardiovascular Disease

## 2022-03-28 DIAGNOSIS — H5200 Hypermetropia, unspecified eye: Secondary | ICD-10-CM | POA: Diagnosis not present

## 2022-03-28 DIAGNOSIS — H5702 Anisocoria: Secondary | ICD-10-CM | POA: Diagnosis not present

## 2022-03-28 DIAGNOSIS — I739 Peripheral vascular disease, unspecified: Secondary | ICD-10-CM

## 2022-03-28 DIAGNOSIS — I10 Essential (primary) hypertension: Secondary | ICD-10-CM | POA: Diagnosis not present

## 2022-03-28 DIAGNOSIS — Z0100 Encounter for examination of eyes and vision without abnormal findings: Secondary | ICD-10-CM | POA: Diagnosis not present

## 2022-03-28 DIAGNOSIS — I255 Ischemic cardiomyopathy: Secondary | ICD-10-CM

## 2022-04-02 ENCOUNTER — Inpatient Hospital Stay (HOSPITAL_COMMUNITY): Admission: RE | Admit: 2022-04-02 | Payer: Medicare HMO | Source: Ambulatory Visit

## 2022-04-03 ENCOUNTER — Encounter (HOSPITAL_COMMUNITY): Payer: Medicare HMO

## 2022-04-04 ENCOUNTER — Other Ambulatory Visit (HOSPITAL_COMMUNITY): Payer: Self-pay | Admitting: Cardiovascular Disease

## 2022-04-04 DIAGNOSIS — I779 Disorder of arteries and arterioles, unspecified: Secondary | ICD-10-CM

## 2022-04-04 DIAGNOSIS — Z8679 Personal history of other diseases of the circulatory system: Secondary | ICD-10-CM

## 2022-04-16 ENCOUNTER — Other Ambulatory Visit: Payer: Self-pay | Admitting: Cardiovascular Disease

## 2022-04-19 ENCOUNTER — Other Ambulatory Visit: Payer: Self-pay | Admitting: Cardiovascular Disease

## 2022-04-19 ENCOUNTER — Ambulatory Visit (HOSPITAL_COMMUNITY)
Admission: RE | Admit: 2022-04-19 | Discharge: 2022-04-19 | Disposition: A | Discharge status: Home / Self Care | Payer: Medicare HMO | Source: Ambulatory Visit | Attending: Cardiology | Admitting: Cardiology

## 2022-04-19 ENCOUNTER — Ambulatory Visit (HOSPITAL_BASED_OUTPATIENT_CLINIC_OR_DEPARTMENT_OTHER)
Admission: RE | Admit: 2022-04-19 | Discharge: 2022-04-19 | Disposition: A | Discharge status: Home / Self Care | Payer: Medicare HMO | Source: Ambulatory Visit | Attending: Cardiovascular Disease | Admitting: Cardiovascular Disease

## 2022-04-19 ENCOUNTER — Inpatient Hospital Stay (HOSPITAL_COMMUNITY): Admission: RE | Admit: 2022-04-19 | Payer: Medicare HMO | Source: Ambulatory Visit

## 2022-04-19 DIAGNOSIS — Z8679 Personal history of other diseases of the circulatory system: Secondary | ICD-10-CM | POA: Diagnosis not present

## 2022-04-19 DIAGNOSIS — I779 Disorder of arteries and arterioles, unspecified: Secondary | ICD-10-CM | POA: Insufficient documentation

## 2022-04-19 DIAGNOSIS — I6521 Occlusion and stenosis of right carotid artery: Secondary | ICD-10-CM | POA: Diagnosis not present

## 2022-04-19 DIAGNOSIS — I10 Essential (primary) hypertension: Secondary | ICD-10-CM

## 2022-05-09 ENCOUNTER — Other Ambulatory Visit: Payer: Self-pay | Admitting: Family Medicine

## 2022-05-09 DIAGNOSIS — E782 Mixed hyperlipidemia: Secondary | ICD-10-CM

## 2022-05-15 ENCOUNTER — Other Ambulatory Visit: Payer: Medicare HMO

## 2022-05-15 DIAGNOSIS — I10 Essential (primary) hypertension: Secondary | ICD-10-CM

## 2022-05-15 DIAGNOSIS — E875 Hyperkalemia: Secondary | ICD-10-CM

## 2022-05-15 DIAGNOSIS — E782 Mixed hyperlipidemia: Secondary | ICD-10-CM

## 2022-05-16 ENCOUNTER — Other Ambulatory Visit: Payer: Medicare HMO

## 2022-05-16 DIAGNOSIS — E875 Hyperkalemia: Secondary | ICD-10-CM

## 2022-05-16 LAB — LIPID PANEL
Chol/HDL Ratio: 4.4 ratio (ref 0.0–5.0)
Cholesterol, Total: 133 mg/dL (ref 100–199)
HDL: 30 mg/dL — ABNORMAL LOW (ref >39)
LDL Chol Calc (NIH): 67 mg/dL (ref 0–99)
Triglycerides: 221 mg/dL — ABNORMAL HIGH (ref 0–149)
VLDL Cholesterol Cal: 36 mg/dL (ref 5–40)

## 2022-05-16 LAB — CMP14+EGFR
ALT: 15 IU/L (ref 0–44)
AST: 18 IU/L (ref 0–40)
Albumin/Globulin Ratio: 1.7 (ref 1.2–2.2)
Albumin: 5 g/dL — ABNORMAL HIGH (ref 3.9–4.9)
Alkaline Phosphatase: 77 IU/L (ref 44–121)
BUN/Creatinine Ratio: 11 (ref 10–24)
BUN: 14 mg/dL (ref 8–27)
Bilirubin Total: 0.5 mg/dL (ref 0.0–1.2)
CO2: 23 mmol/L (ref 20–29)
Calcium: 10.9 mg/dL — ABNORMAL HIGH (ref 8.6–10.2)
Chloride: 101 mmol/L (ref 96–106)
Creatinine, Ser: 1.27 mg/dL (ref 0.76–1.27)
Globulin, Total: 2.9 g/dL (ref 1.5–4.5)
Glucose: 107 mg/dL — ABNORMAL HIGH (ref 70–99)
Potassium: 5.9 mmol/L (ref 3.5–5.2)
Sodium: 138 mmol/L (ref 134–144)
Total Protein: 7.9 g/dL (ref 6.0–8.5)
eGFR: 62 mL/min/{1.73_m2} (ref >59)

## 2022-05-16 LAB — CBC WITH DIFFERENTIAL/PLATELET
Basophils Absolute: 0 10*3/uL (ref 0.0–0.2)
Basos: 0 %
EOS (ABSOLUTE): 0.3 10*3/uL (ref 0.0–0.4)
Eos: 4 %
Hematocrit: 43 % (ref 37.5–51.0)
Hemoglobin: 14.3 g/dL (ref 13.0–17.7)
Immature Grans (Abs): 0 10*3/uL (ref 0.0–0.1)
Immature Granulocytes: 0 %
Lymphocytes Absolute: 1.9 10*3/uL (ref 0.7–3.1)
Lymphs: 23 %
MCH: 29.1 pg (ref 26.6–33.0)
MCHC: 33.3 g/dL (ref 31.5–35.7)
MCV: 87 fL (ref 79–97)
Monocytes Absolute: 0.6 10*3/uL (ref 0.1–0.9)
Monocytes: 7 %
Neutrophils Absolute: 5.4 10*3/uL (ref 1.4–7.0)
Neutrophils: 66 %
Platelets: 349 10*3/uL (ref 150–450)
RBC: 4.92 x10E6/uL (ref 4.14–5.80)
RDW: 13 % (ref 11.6–15.4)
WBC: 8.3 10*3/uL (ref 3.4–10.8)

## 2022-05-17 ENCOUNTER — Ambulatory Visit (INDEPENDENT_AMBULATORY_CARE_PROVIDER_SITE_OTHER): Payer: Medicare HMO | Admitting: Family Medicine

## 2022-05-17 ENCOUNTER — Encounter: Payer: Self-pay | Admitting: Family Medicine

## 2022-05-17 VITALS — BP 126/65 | HR 59 | Temp 98.0°F | Ht 70.0 in | Wt 158.0 lb

## 2022-05-17 DIAGNOSIS — F5101 Primary insomnia: Secondary | ICD-10-CM | POA: Diagnosis not present

## 2022-05-17 DIAGNOSIS — I2583 Coronary atherosclerosis due to lipid rich plaque: Secondary | ICD-10-CM

## 2022-05-17 DIAGNOSIS — K219 Gastro-esophageal reflux disease without esophagitis: Secondary | ICD-10-CM | POA: Diagnosis not present

## 2022-05-17 DIAGNOSIS — I10 Essential (primary) hypertension: Secondary | ICD-10-CM | POA: Diagnosis not present

## 2022-05-17 DIAGNOSIS — R69 Illness, unspecified: Secondary | ICD-10-CM | POA: Diagnosis not present

## 2022-05-17 DIAGNOSIS — E782 Mixed hyperlipidemia: Secondary | ICD-10-CM | POA: Diagnosis not present

## 2022-05-17 DIAGNOSIS — I255 Ischemic cardiomyopathy: Secondary | ICD-10-CM | POA: Diagnosis not present

## 2022-05-17 DIAGNOSIS — I2510 Atherosclerotic heart disease of native coronary artery without angina pectoris: Secondary | ICD-10-CM

## 2022-05-17 LAB — BMP8+EGFR
BUN/Creatinine Ratio: 11 (ref 10–24)
BUN: 14 mg/dL (ref 8–27)
CO2: 18 mmol/L — ABNORMAL LOW (ref 20–29)
Calcium: 10.2 mg/dL (ref 8.6–10.2)
Chloride: 102 mmol/L (ref 96–106)
Creatinine, Ser: 1.33 mg/dL — ABNORMAL HIGH (ref 0.76–1.27)
Glucose: 102 mg/dL — ABNORMAL HIGH (ref 70–99)
Potassium: 4.6 mmol/L (ref 3.5–5.2)
Sodium: 138 mmol/L (ref 134–144)
eGFR: 59 mL/min/{1.73_m2} — ABNORMAL LOW (ref >59)

## 2022-05-17 MED ORDER — MIRTAZAPINE 30 MG PO TABS: 30.0000 mg | ORAL_TABLET | Freq: Every day | ORAL | 3 refills | Status: DC

## 2022-05-17 MED ORDER — EZETIMIBE 10 MG PO TABS: 10.0000 mg | ORAL_TABLET | Freq: Every day | ORAL | 3 refills | Status: DC

## 2022-05-17 MED ORDER — FENOFIBRATE 145 MG PO TABS: 145.0000 mg | ORAL_TABLET | Freq: Every day | ORAL | 3 refills | Status: DC

## 2022-05-17 NOTE — Progress Notes (Signed)
BP 126/65   Pulse (!) 59   Temp 98 F (36.7 C)   Ht 5\' 10"  (1.778 m)   Wt 158 lb (71.7 kg)   SpO2 97%   BMI 22.67 kg/m    Subjective:   Patient ID: Jonathon Ryan, male    DOB: 1956/05/12, 66 y.o.   MRN: [id number]  HPI: Jonathon Ryan is a 66 y.o. male presenting on 05/17/2022 for Medical Management of Chronic Issues, Hyperlipidemia, and Hypertension   HPI Hypertension Patient is currently on lisinopril, and their blood pressure today is 126/65. Patient denies any lightheadedness or dizziness. Patient denies headaches, blurred vision, chest pains, shortness of breath, or weakness. Denies any side effects from medication and is content with current medication.   Hyperlipidemia and CAD and ischemic cardiomyopathy, sees cardiology Patient is coming in for recheck of his hyperlipidemia. The patient is currently taking Zetia and fenofibrate and Crestor. They deny any issues with myalgias or history of liver damage from it. They deny any focal numbness or weakness or chest pain.   GERD Patient is currently on omeprazole.  She denies any major symptoms or abdominal pain or belching or burping. She denies any blood in her stool or lightheadedness or dizziness.   Relevant past medical, surgical, family and social history reviewed and updated as indicated. Interim medical history since our last visit reviewed. Allergies and medications reviewed and updated.  Review of Systems  Constitutional:  Negative for chills and fever.  Eyes:  Negative for visual disturbance.  Respiratory:  Negative for shortness of breath and wheezing.   Cardiovascular:  Negative for chest pain and leg swelling.  Musculoskeletal:  Negative for back pain and gait problem.  Skin:  Negative for rash.  Neurological:  Negative for dizziness, weakness and light-headedness.  All other systems reviewed and are negative.   Per HPI unless specifically indicated above   Allergies as of 05/17/2022   No Known Allergies       Medication List        Accurate as of May 17, 2022  8:54 AM. If you have any questions, ask your nurse or doctor.          aspirin 81 MG tablet Take 1 tablet (81 mg total) by mouth daily.   carvedilol 25 MG tablet Commonly known as: COREG Take 1 tablet (25 mg total) by mouth 2 (two) times daily with a meal.   clopidogrel 75 MG tablet Commonly known as: PLAVIX Take 1 tablet by mouth once daily   ezetimibe 10 MG tablet Commonly known as: ZETIA Take 1 tablet (10 mg total) by mouth daily.   fenofibrate 145 MG tablet Commonly known as: TRICOR Take 1 tablet (145 mg total) by mouth daily.   lisinopril 10 MG tablet Commonly known as: ZESTRIL Take 1 tablet by mouth once daily   mirtazapine 30 MG tablet Commonly known as: REMERON Take 1 tablet (30 mg total) by mouth at bedtime.   omeprazole 40 MG capsule Commonly known as: PRILOSEC Take 1 capsule (40 mg total) by mouth daily.   rosuvastatin 40 MG tablet Commonly known as: CRESTOR Take 1 tablet (40 mg total) by mouth daily.         Objective:   BP 126/65   Pulse (!) 59   Temp 98 F (36.7 C)   Ht 5\' 10"  (1.778 m)   Wt 158 lb (71.7 kg)   SpO2 97%   BMI 22.67 kg/m   Wt Readings from Last 3  Encounters:  05/17/22 158 lb (71.7 kg)  11/15/21 161 lb (73 kg)  08/30/21 165 lb (74.8 kg)    Physical Exam Vitals and nursing note reviewed.  Constitutional:      General: He is not in acute distress.    Appearance: He is well-developed. He is not diaphoretic.  Eyes:     General: No scleral icterus.    Conjunctiva/sclera: Conjunctivae normal.  Neck:     Thyroid: No thyromegaly.  Cardiovascular:     Rate and Rhythm: Normal rate and regular rhythm.     Heart sounds: Normal heart sounds. No murmur heard. Pulmonary:     Effort: Pulmonary effort is normal. No respiratory distress.     Breath sounds: Normal breath sounds. No wheezing.  Musculoskeletal:        General: No swelling. Normal range of motion.      Cervical back: Neck supple.  Lymphadenopathy:     Cervical: No cervical adenopathy.  Skin:    General: Skin is warm and dry.     Findings: No rash.  Neurological:     Mental Status: He is alert and oriented to person, place, and time.     Coordination: Coordination normal.  Psychiatric:        Behavior: Behavior normal.       Assessment & Plan:   Problem List Items Addressed This Visit       Cardiovascular and Mediastinum   Hypertension - Primary   Relevant Medications   ezetimibe (ZETIA) 10 MG tablet   fenofibrate (TRICOR) 145 MG tablet   CAD, 90% LM, Total RCA, 80-90% CFX- S/P urgent CABG X 11 Jun 2012   Relevant Medications   ezetimibe (ZETIA) 10 MG tablet   fenofibrate (TRICOR) 145 MG tablet   Ischemic cardiomyopathy   Relevant Medications   ezetimibe (ZETIA) 10 MG tablet   fenofibrate (TRICOR) 145 MG tablet     Digestive   GERD (gastroesophageal reflux disease)     Other   HLD (hyperlipidemia)   Relevant Medications   ezetimibe (ZETIA) 10 MG tablet   fenofibrate (TRICOR) 145 MG tablet   Other Visit Diagnoses     Primary insomnia       Relevant Medications   mirtazapine (REMERON) 30 MG tablet       Labs look to be improved, no change in medication, follow-up in 6 months  Follow up plan: Return in about 6 months (around 11/17/2022), or if symptoms worsen or fail to improve, for Physical and hypertension and hyperlipidemia and GERD.  Counseling provided for all of the vaccine components No orders of the defined types were placed in this encounter.   Arville Care, MD [facility] 05/17/2022, 8:54 AM

## 2022-07-01 ENCOUNTER — Other Ambulatory Visit: Payer: Self-pay | Admitting: Cardiovascular Disease

## 2022-07-01 DIAGNOSIS — I255 Ischemic cardiomyopathy: Secondary | ICD-10-CM

## 2022-07-04 ENCOUNTER — Other Ambulatory Visit (HOSPITAL_COMMUNITY): Payer: Self-pay | Admitting: Cardiovascular Disease

## 2022-07-04 DIAGNOSIS — I779 Disorder of arteries and arterioles, unspecified: Secondary | ICD-10-CM

## 2022-08-09 ENCOUNTER — Ambulatory Visit: Payer: Medicare HMO | Attending: Cardiovascular Disease | Admitting: Cardiovascular Disease

## 2022-08-09 ENCOUNTER — Encounter: Payer: Self-pay | Admitting: Cardiovascular Disease

## 2022-08-09 VITALS — BP 140/82 | HR 60 | Ht 70.0 in | Wt 159.6 lb

## 2022-08-09 DIAGNOSIS — I739 Peripheral vascular disease, unspecified: Secondary | ICD-10-CM | POA: Diagnosis not present

## 2022-08-09 DIAGNOSIS — Z720 Tobacco use: Secondary | ICD-10-CM | POA: Diagnosis not present

## 2022-08-09 DIAGNOSIS — I255 Ischemic cardiomyopathy: Secondary | ICD-10-CM | POA: Diagnosis not present

## 2022-08-09 DIAGNOSIS — I701 Atherosclerosis of renal artery: Secondary | ICD-10-CM

## 2022-08-09 DIAGNOSIS — E782 Mixed hyperlipidemia: Secondary | ICD-10-CM | POA: Diagnosis not present

## 2022-08-09 DIAGNOSIS — I10 Essential (primary) hypertension: Secondary | ICD-10-CM

## 2022-08-09 DIAGNOSIS — I6523 Occlusion and stenosis of bilateral carotid arteries: Secondary | ICD-10-CM

## 2022-08-09 NOTE — Assessment & Plan Note (Signed)
History of ischemic cardiomyopathy status post witnessed cardiac arrest with CPR and cardiac catheterization revealing left main three-vessel disease by myself.  He ultimately underwent coronary artery bypass grafting by Dr. Dahlia Byes September 2013 with a LIMA to his LAD, vein to the PDA and OM1.  He denies chest pain or shortness of breath.  His EF last checked 04/15/2019 was 30 of 35%.  Does have left bundle branch block and has had multiple ICD placed which had been explanted because of infection.  He is on carvedilol and lisinopril.  I am referring him to the Pharm.D. to transition him to [facility].

## 2022-08-09 NOTE — Assessment & Plan Note (Signed)
History of essential hypertension blood pressure measured today at 140/82.  He is on carvedilol and lisinopril.

## 2022-08-09 NOTE — Progress Notes (Signed)
08/09/2022 Jonathon Ryan   1956-04-10  [id number]  Primary Physician Dettinger, Jonathon Radon, MD Primary Cardiologist: Jonathon Gess MD Milagros Loll, [place], [place]  HPI:  Jonathon Ryan is a 66 y.o.   thin-appearing, married Caucasian father of 2 stepchildren, grandfather to 6 grandchildren, who I last saw in the office 04/05/2021.  He had witnessed cardiac arrest with bystander CPR and defibrillation, arctic sun who presented for emergent cardiac catheterization by myself revealing left main 3-vessel disease and he ultimately underwent emergency coronary artery bypass grafting by Dr. Kathlee Nations Trigt with LIMA to his LAD, vein to a PDA, sequential vein to OM1 and 2. His EF was 25% to 30%. He made a full neurologic recovery. He is wearing a LifeVest. His other problems include hypertension and hyperlipidemia and discontinued tobacco abuse which he stopped June 21, 2012. He also has peripheral vascular occlusive disease with high-grade left internal carotid artery stenosis followed by duplex ultrasound, high-grade right renal artery stenosis and right iliac stenosis though he denies claudication. We have gotten Dopplers on both his renals and his lower extremities. He had a Myoview stress test that showed an inferior scar without ischemia with an EF in the 25%-30% range confirmed by 2D echo. I performed carotid stenting on him under the "sapphire protocol" for high risk asymptomatic patients on October 14, 2012, with an excellent result. Followup Dopplers of his carotidis performed on October 28, 2012, were normal. Dr. Hillis Range performed implantation of a BiV ICD on November 25, 2012, which has resulted in marked improvement in him clinically with regards to exercise tolerance. We continue to follow his carotid and renal Dopplers which have remained stable. His last carotid, renal and lower actually a partial Doppler studies performed in the last one 2 years have been stable. He denies claudication. He  did unfortunately suffer a pocket infection of his ICD requiring explant. He also had a subcutaneous ICD placed as followed by [name].   I saw him a year ago he is remained stable.  He continues to not deny chest pain or shortness of breath.   Since I saw him 12 months ago he is remained stable.  He is active and plays golf and plays with his grandchildren.  He did have his subcutaneous ICD explanted by Dr. Lalla Brothers in the past because of infection.  He denies chest pain or shortness of breath.   Current Meds  Medication Sig   aspirin 81 MG tablet Take 1 tablet (81 mg total) by mouth daily.   carvedilol (COREG) 25 MG tablet Take 1 tablet (25 mg total) by mouth 2 (two) times daily with a meal.   clopidogrel (PLAVIX) 75 MG tablet TAKE 1 TABLET BY MOUTH ONCE DAILY . APPOINTMENT REQUIRED FOR FUTURE REFILLS   ezetimibe (ZETIA) 10 MG tablet Take 1 tablet (10 mg total) by mouth daily.   fenofibrate (TRICOR) 145 MG tablet Take 1 tablet (145 mg total) by mouth daily.   lisinopril (ZESTRIL) 10 MG tablet Take 1 tablet (10 mg total) by mouth daily. APPOINTMENT NEEDED FOR FUTURE REFILLS   omeprazole (PRILOSEC) 40 MG capsule Take 1 capsule (40 mg total) by mouth daily.   rosuvastatin (CRESTOR) 40 MG tablet Take 1 tablet (40 mg total) by mouth daily.     No Known Allergies  Social History   Socioeconomic History   Marital status: Married    Spouse name: Jonathon Ryan   Number of children: 2   Years of education:  10   Highest education level: GED or equivalent  Occupational History   Occupation: disability  Tobacco Use   Smoking status: Every Day    Packs/day: 1.00    Years: 40.00    Total pack years: 40.00    Types: Cigarettes   Smokeless tobacco: Never   Tobacco comments:    Quit 2013, started back; now 1 ppd  Vaping Use   Vaping Use: Never used  Substance and Sexual Activity   Alcohol use: Yes    Alcohol/week: 14.0 standard drinks of alcohol    Types: 14 Cans of beer per week    Comment:  couple of drinks most days   Drug use: Yes    Frequency: 2.0 times per week    Types: Marijuana    Comment: 2 per week   Sexual activity: Yes    Comment: married and 2 step-daughters  Other Topics Concern   Not on file  Social History Narrative   Lives in one level home with his wife.   Bought an older home to remodel as a project since he retired - this keeps him busy and active   Social Determinants of Corporate investment banker Strain: Low Risk  (08/30/2021)   Overall Financial Resource Strain (CARDIA)    Difficulty of Paying Living Expenses: Not hard at all  Food Insecurity: No Food Insecurity (08/30/2021)   Hunger Vital Sign    Worried About Running Out of Food in the Last Year: Never true    Ran Out of Food in the Last Year: Never true  Transportation Needs: No Transportation Needs (08/30/2021)   PRAPARE - Administrator, Civil Service (Medical): No    Lack of Transportation (Non-Medical): No  Physical Activity: Sufficiently Active (08/30/2021)   Exercise Vital Sign    Days of Exercise per Week: 5 days    Minutes of Exercise per Session: 30 min  Stress: No Stress Concern Present (08/30/2021)   [employer] of Occupational Health - Occupational Stress Questionnaire    Feeling of Stress : Not at all  Social Connections: Moderately Integrated (08/30/2021)   Social Connection and Isolation Panel [NHANES]    Frequency of Communication with Friends and Family: Twice a week    Frequency of Social Gatherings with Friends and Family: Twice a week    Attends Religious Services: Never    Database administrator or Organizations: Yes    Attends Banker Meetings: 1 to 4 times per year    Marital Status: Married  Catering manager Violence: Not At Risk (08/30/2021)   Humiliation, Afraid, Rape, and Kick questionnaire    Fear of Current or Ex-Partner: No    Emotionally Abused: No    Physically Abused: No    Sexually Abused: No     Review of  Systems: General: negative for chills, fever, night sweats or weight changes.  Cardiovascular: negative for chest pain, dyspnea on exertion, edema, orthopnea, palpitations, paroxysmal nocturnal dyspnea or shortness of breath Dermatological: negative for rash Respiratory: negative for cough or wheezing Urologic: negative for hematuria Abdominal: negative for nausea, vomiting, diarrhea, bright red blood per rectum, melena, or hematemesis Neurologic: negative for visual changes, syncope, or dizziness All other systems reviewed and are otherwise negative except as noted above.    Blood pressure (!) 140/82, pulse 60, height 5\' 10"  (1.778 m), weight 159 lb 9.6 oz (72.4 kg), SpO2 98 %.  General appearance: alert and no distress Neck: no adenopathy, no JVD, supple,  symmetrical, trachea midline, thyroid not enlarged, symmetric, no tenderness/mass/nodules, and bilateral carotid bruits Lungs: clear to auscultation bilaterally Heart: regular rate and rhythm, S1, S2 normal, no murmur, click, rub or gallop Extremities: extremities normal, atraumatic, no cyanosis or edema Pulses: 2+ and symmetric Skin: Skin color, texture, turgor normal. No rashes or lesions Neurologic: Grossly normal  EKG sinus rhythm at 60 with low bundle-branch block.  I personally reviewed this EKG.  ASSESSMENT AND PLAN:   Tobacco abuse Ongoing tobacco abuse of 1 pack/day recalcitrant to risk factor modification.  HLD (hyperlipidemia) History of hyperlipidemia on high-dose statin therapy, Zetia and fenofibrate with lipid profile performed 05/15/2022 revealing total cholesterol of 133, LDL 67 and HDL of 30.  Triglyceride level was 221.  Ischemic cardiomyopathy History of ischemic cardiomyopathy status post witnessed cardiac arrest with CPR and cardiac catheterization revealing left main three-vessel disease by myself.  He ultimately underwent coronary artery bypass grafting by Dr. Dahlia Byes September 2013 with a LIMA to his  LAD, vein to the PDA and OM1.  He denies chest pain or shortness of breath.  His EF last checked 04/15/2019 was 30 of 35%.  Does have left bundle branch block and has had multiple ICD placed which had been explanted because of infection.  He is on carvedilol and lisinopril.  I am referring him to the Pharm.D. to transition him to [facility].  PVD, 90% Rt RAS, 80% Rt Iliac at cath History of PAD with documentation of high-grade right renal artery stenosis and right iliac stenosis at the time of his cath.  Renal Dopplers have shown continued patency of his right renal artery.  He really denies claudication.  Carotid artery stenosis, LICA stent 05/12/62 History of carotid artery disease status post left internal carotid artery stenting by myself 10/14/2012.  Carotid Dopplers performed 04/19/2022 revealed this to be widely patent with no evidence of ICA stenosis.  LBBB (left bundle branch block) Chronic  Hypertension History of essential hypertension blood pressure measured today at 140/82.  He is on carvedilol and lisinopril.     Lorretta Harp MD FACP,[name],[name], [facility] 08/09/2022 3:04 PM

## 2022-08-09 NOTE — Assessment & Plan Note (Signed)
History of carotid artery disease status post left internal carotid artery stenting by myself 10/14/2012.  Carotid Dopplers performed 04/19/2022 revealed this to be widely patent with no evidence of ICA stenosis.

## 2022-08-09 NOTE — Assessment & Plan Note (Signed)
History of PAD with documentation of high-grade right renal artery stenosis and right iliac stenosis at the time of his cath.  Renal Dopplers have shown continued patency of his right renal artery.  He really denies claudication.

## 2022-08-09 NOTE — Assessment & Plan Note (Signed)
Chronic. 

## 2022-08-09 NOTE — Assessment & Plan Note (Signed)
History of hyperlipidemia on high-dose statin therapy, Zetia and fenofibrate with lipid profile performed 05/15/2022 revealing total cholesterol of 133, LDL 67 and HDL of 30.  Triglyceride level was 221.

## 2022-08-09 NOTE — Assessment & Plan Note (Signed)
Ongoing tobacco abuse of 1 pack/day recalcitrant to risk factor modification. 

## 2022-08-09 NOTE — Patient Instructions (Signed)
Medication Instructions:  The current medical regimen is effective;  continue present plan and medications.  *If you need a refill on your cardiac medications before your next appointment, please call your pharmacy*  Testing/Procedures: Echocardiogram - Your physician has requested that you have an echocardiogram. Echocardiography is a painless test that uses sound waves to create images of your heart. It provides your doctor with information about the size and shape of your heart and how well your heart's chambers and valves are working. This procedure takes approximately one hour. There are no restrictions for this procedure.    Your physician has requested that you have a carotid duplex (July 2024). This test is an ultrasound of the carotid arteries in your neck. It looks at blood flow through these arteries that supply the brain with blood. Allow one hour for this exam. There are no restrictions or special instructions.   Your physician has requested that you have a renal artery duplex (July 2024). During this test, an ultrasound is used to evaluate blood flow to the kidneys. Allow one hour for this exam. Do not eat after midnight the day before and avoid carbonated beverages. Take your medications as you usually do.    Follow-Up: At [facility], you and your health needs are our priority.  As part of our continuing mission to provide you with exceptional heart care, we have created designated Provider Care Teams.  These Care Teams include your primary Cardiologist (physician) and Advanced Practice Providers (APPs -  Physician Assistants and Nurse Practitioners) who all work together to provide you with the care you need, when you need it.  We recommend signing up for the patient portal called "MyChart".  Sign up information is provided on this After Visit Summary.  MyChart is used to connect with patients for Virtual Visits (Telemedicine).  Patients are able to view lab/test results,  encounter notes, upcoming appointments, etc.  Non-urgent messages can be sent to your provider as well.   To learn more about what you can do with MyChart, go to [email or web address].    Your next appointment:   6 month(s)  The format for your next appointment:   In Person  Provider:   Any APP then back to see Quay Burow, MD in 12 months   Needs PHARMD appointment to start [facility].

## 2022-09-03 ENCOUNTER — Ambulatory Visit (HOSPITAL_COMMUNITY): Payer: Medicare HMO | Attending: Cardiovascular Disease

## 2022-09-03 DIAGNOSIS — I255 Ischemic cardiomyopathy: Secondary | ICD-10-CM | POA: Insufficient documentation

## 2022-09-03 LAB — ECHOCARDIOGRAM COMPLETE
Area-P 1/2: 3.21 cm2
S' Lateral: 4.8 cm

## 2022-09-18 ENCOUNTER — Telehealth: Payer: Self-pay | Admitting: Pharmacist

## 2022-09-18 ENCOUNTER — Ambulatory Visit: Payer: Medicare HMO | Attending: Internal Medicine | Admitting: Student

## 2022-09-18 DIAGNOSIS — I255 Ischemic cardiomyopathy: Secondary | ICD-10-CM

## 2022-09-18 MED ORDER — SACUBITRIL-VALSARTAN 24-26 MG PO TABS: 1.0000 | ORAL_TABLET | Freq: Two times a day (BID) | ORAL | 1 refills | Status: DC

## 2022-09-18 NOTE — Patient Instructions (Signed)
Changes made by your pharmacist Carmela Hurt, PharmD at today's visit:    Instructions/Changes  (what do you need to do) Your Notes  (what you did and when you did it)  Switching Lisinopril to Entresto    Do not take lisinopril 10 mg on Wednesday and start taking Entresto 24/26(1 tablet) from Thursday evening then continue taking Entresto 24/26 twice daily.        Bring all of your meds, your BP cuff and your record of home blood pressures to your next appointment.    HOW TO TAKE YOUR BLOOD PRESSURE AT HOME  Rest 5 minutes before taking your blood pressure.  Don't smoke or drink caffeinated beverages for at least 30 minutes before. Take your blood pressure before (not after) you eat. Sit comfortably with your back supported and both feet on the floor (don't cross your legs). Elevate your arm to heart level on a table or a desk. Use the proper sized cuff. It should fit smoothly and snugly around your bare upper arm. There should be enough room to slip a fingertip under the cuff. The bottom edge of the cuff should be 1 inch above the crease of the elbow. Ideally, take 3 measurements at one sitting and record the average.  Important lifestyle changes to control high blood pressure  Intervention  Effect on the BP  Lose extra pounds and watch your waistline Weight loss is one of the most effective lifestyle changes for controlling blood pressure. If you're overweight or obese, losing even a small amount of weight can help reduce blood pressure. Blood pressure might go down by about 1 millimeter of mercury (mm Hg) with each kilogram (about 2.2 pounds) of weight lost.  Exercise regularly As a general goal, aim for at least 30 minutes of moderate physical activity every day. Regular physical activity can lower high blood pressure by about 5 to 8 mm Hg.  Eat a healthy diet Eating a diet rich in whole grains, fruits, vegetables, and low-fat dairy products and low in saturated fat and  cholesterol. A healthy diet can lower high blood pressure by up to 11 mm Hg.  Reduce salt (sodium) in your diet Even a small reduction of sodium in the diet can improve heart health and reduce high blood pressure by about 5 to 6 mm Hg.  Limit alcohol One drink equals 12 ounces of beer, 5 ounces of wine, or 1.5 ounces of 80-proof liquor.  Limiting alcohol to less than one drink a day for women or two drinks a day for men can help lower blood pressure by about 4 mm Hg.   If you have any questions or concerns please use My Chart to send questions or call the office at [phone]

## 2022-09-18 NOTE — Assessment & Plan Note (Addendum)
Assessment:  BP uncontrolled in office 1st reading was 145/69 went up with 2nd reading 152/70 Tolerates lisinopril 10 mg and carvedilol 25 mg twice daily well without side effects  Discussed cardiac medication indications, introduction to GDMT clinic, reasoning behind medication titration, importance of medication adherence, and patient engagement. Patient is in agreement with make necessary changes to optimize heart medication  Mentioned cost concern for brand name medication  Plan: Continue taking carvedilol 25 mg twice daily  Stop lisinopril (don't take on 09/19/2022)  Start taking Entresto 24/26 from Thursday (09/20/2022) night to get the 36 hours of washout period and continue taking twice daily thereafter  Enrolled in the grant for co-pay assistance  Patient to monitor BP at home and bring cuff for validation at the next appointment  Lab - BMP in 2 weeks to assess renal function and potassium

## 2022-09-18 NOTE — Progress Notes (Unsigned)
Patient ID: KASRA MELVIN                 DOB: [birth year]                      MRN: [id number]     157 lbs  HPI: Jonathon Ryan is a 66 y.o. male referred by Dr. [name]  to pharmacy clinic for HF medication management. PMH is significant for hypertension, HDL, tobacco abuse, peripheral vascular disease. Most recent LVEF 30-35% on 09/03/2022.  Home BP 150/70 resting heart rate 60-70  Discussion with patient today included the following: cardiac medication indications, introduction to GDMT clinic, reasoning behind medication titration, importance of medication adherence, and patient engagement. . At last visit with MD Dr.Berry emphasize on quit smoking. Symptomatically, he is feeling well, Denies dizziness, lightheadedness, and fatigue. Strenous activity causes mild  chest pain which resolves on resting. Denies any palpitations or SOB. Able to complete all ADLs. Activity level is high -active around the house building and fixing thing. He Does not checks his weight at home regularly but denies LEE, PND, or orthopnea. Appetite has been normal. He mostly adheres to a low-salt diet.    Current CHF meds: Lisinopril 10 mg daily, carvedilol 25 mg twice daily    Adherence Assessment  Do you ever forget to take your medication? [] Yes [x] No  Do you ever skip doses due to side effects? [] Yes [x] No  Do you have trouble affording your medicines? Not on any brand name medications so does not have problem so far but prefers to get enrolled in the grant for Entresto co-pay [] Yes [x] No  Are you ever unable to pick up your medication due to transportation difficulties? [] Yes [x] No  Do you ever stop taking your medications because you don't believe they are helping? [] Yes [x] No  Do you check your weight daily? [] Yes [x] No   Adherence strategy: alarm on the phone and set the pill bottle right on the table so remember to take it - may forget once month second dose of carvedilol  Barriers to obtaining  medications: None  BP goal: < 130/80   Social History:  Smoking -does not want to quit wife  Recerational drug use : every other day  Alchohol: 10-14 drinks/week   Diet: do not add salt to his food avoids eating out an does not eat no fried food,uses air fryer a lot   Exercise: active around the house building stuff for grand kids in winter and golf 3 X week during summer Home BP ~150/70 resting heart rate 60-70  Wt Readings from Last 3 Encounters:  08/09/22 159 lb 9.6 oz (72.4 kg)  05/17/22 158 lb (71.7 kg)  11/15/21 161 lb (73 kg)   BP Readings from Last 3 Encounters:  09/18/22 (!) 152/70  08/09/22 (!) 140/82  05/17/22 126/65   Pulse Readings from Last 3 Encounters:  09/18/22 77  08/09/22 60  05/17/22 (!) 59    Renal function: CrCl cannot be calculated (Patient's most recent lab result is older than the maximum 21 days allowed.).  Past Medical History:  Diagnosis Date   Anxiety    CAD (coronary artery disease) 9/13   s/p CABG   Cardiac arrest due to underlying cardiac condition [facility])    successfully rescucitated/ cooled   Cardiac defibrillator in place    Cerebrovascular disease 10/15/2012   Chronic systolic dysfunction of left ventricle    GERD (gastroesophageal reflux disease)    Headache(784.0)  Hyperlipidemia    Hypertension    Infection of pacemaker pocket [facility])    of ICD with subsequent explant   Ischemic cardiomyopathy    s/p BiV ICD implant Feb 2014 - [address]. [name] Apollo, model 5465KP-54 (serial # [id number]) right atrial lead and a [name] [place], model 6568-12X (serial number [id number]) right ventricular defibrillator lead. [name] Quartet model [phone] - 86 (serial number [id number]) LV lead. [name] Quadra Assura model [place] 1749 [phone] (serial Number [id number]) biventricular ICD.    Left bundle branch block    Peripheral arterial disease (HCC)    PONV (postoperative nausea and vomiting)    Renal artery stenosis (HCC)     Shortness of breath dyspnea    with exertion   Spinal headache     Current Outpatient Medications on File Prior to Visit  Medication Sig Dispense Refill   aspirin 81 MG tablet Take 1 tablet (81 mg total) by mouth daily. 30 tablet    carvedilol (COREG) 25 MG tablet Take 1 tablet (25 mg total) by mouth 2 (two) times daily with a meal. 60 tablet 0   clopidogrel (PLAVIX) 75 MG tablet TAKE 1 TABLET BY MOUTH ONCE DAILY . APPOINTMENT REQUIRED FOR FUTURE REFILLS 90 tablet 0   ezetimibe (ZETIA) 10 MG tablet Take 1 tablet (10 mg total) by mouth daily. 90 tablet 3   fenofibrate (TRICOR) 145 MG tablet Take 1 tablet (145 mg total) by mouth daily. 90 tablet 3   mirtazapine (REMERON) 30 MG tablet Take 1 tablet (30 mg total) by mouth at bedtime. (Patient not taking: Reported on 08/09/2022) 90 tablet 3   omeprazole (PRILOSEC) 40 MG capsule Take 1 capsule (40 mg total) by mouth daily. 90 capsule 3   rosuvastatin (CRESTOR) 40 MG tablet Take 1 tablet (40 mg total) by mouth daily. 90 tablet 3   No current facility-administered medications on file prior to visit.    No Known Allergies  Ischemic cardiomyopathy Assessment:  BP uncontrolled in office 1st reading was 145/69 went up with 2nd reading 152/70 Tolerates lisinopril 10 mg and carvedilol 25 mg twice daily well without side effects  Discussed cardiac medication indications, introduction to GDMT clinic, reasoning behind medication titration, importance of medication adherence, and patient engagement. Patient is in agreement with make necessary changes to optimize heart medication  Mentioned cost concern for brand name medication  Plan: Continue taking carvedilol 25 mg twice daily  Stop lisinopril (don't take on 09/19/2022)  Start taking Entresto 24/26 from Thursday (09/20/2022) night to get the 36 hours of washout period and continue taking twice daily thereafter  Enrolled in the grant for co-pay assistance  Patient to monitor BP at home and bring cuff  for validation at the next appointment  Lab - BMP in 2 weeks to assess renal function and potassium   Thank you   Carmela Hurt, Pharm.D [facility] A Division of [facility] [address] [place], [place] [address]  Phone: [phone]; Fax: [phone]

## 2022-09-18 NOTE — Telephone Encounter (Addendum)
Enrolled in cardiomyopathy [employer] Grant  CARD NO. [id number]   CARD STATUS Active   BIN [id number]   PCN [id number]   PC GROUP [id number]  Co pay card information communicated with patient via MyChart

## 2022-09-19 ENCOUNTER — Ambulatory Visit (INDEPENDENT_AMBULATORY_CARE_PROVIDER_SITE_OTHER): Payer: Medicare HMO

## 2022-09-19 DIAGNOSIS — Z0000 Encounter for general adult medical examination without abnormal findings: Secondary | ICD-10-CM | POA: Diagnosis not present

## 2022-09-19 NOTE — Progress Notes (Signed)
Subjective:   Jonathon Ryan is a 66 y.o. male who presents for Medicare Annual/Subsequent preventive examination.  I connected with  Jonathon Ryan on 09/19/22 by a audio enabled telemedicine application and verified that I am speaking with the correct person using two identifiers.  Patient Location: Home  Provider Location: Office/Clinic  I discussed the limitations of evaluation and management by telemedicine. The patient expressed understanding and agreed to proceed.   Review of Systems    Defer to PCP  Cardiac Risk Factors include: advanced age (>8men, >56 women);dyslipidemia;hypertension;male gender;smoking/ tobacco exposure     Objective:    There were no vitals filed for this visit. There is no height or weight on file to calculate BMI.     09/19/2022    9:52 AM 08/30/2021   10:39 AM 08/29/2020   10:44 AM 07/13/2020    6:00 PM 07/13/2020   12:33 PM 08/27/2019   10:40 AM 08/29/2015   12:46 PM  Advanced Directives  Does Patient Have a Medical Advance Directive? No No No No No No No  Would patient like information on creating a medical advance directive? No - Patient declined No - Patient declined Yes (MAU/Ambulatory/Procedural Areas - Information given) No - Patient declined No - Patient declined No - Patient declined No - patient declined information    Current Medications (verified) Outpatient Encounter Medications as of 09/19/2022  Medication Sig   aspirin 81 MG tablet Take 1 tablet (81 mg total) by mouth daily.   carvedilol (COREG) 25 MG tablet Take 1 tablet (25 mg total) by mouth 2 (two) times daily with a meal.   clopidogrel (PLAVIX) 75 MG tablet TAKE 1 TABLET BY MOUTH ONCE DAILY . APPOINTMENT REQUIRED FOR FUTURE REFILLS   ezetimibe (ZETIA) 10 MG tablet Take 1 tablet (10 mg total) by mouth daily.   fenofibrate (TRICOR) 145 MG tablet Take 1 tablet (145 mg total) by mouth daily.   omeprazole (PRILOSEC) 40 MG capsule Take 1 capsule (40 mg total) by mouth daily.    rosuvastatin (CRESTOR) 40 MG tablet Take 1 tablet (40 mg total) by mouth daily.   sacubitril-valsartan (ENTRESTO) 24-26 MG Take 1 tablet by mouth 2 (two) times daily.   mirtazapine (REMERON) 30 MG tablet Take 1 tablet (30 mg total) by mouth at bedtime. (Patient not taking: Reported on 08/09/2022)   No facility-administered encounter medications on file as of 09/19/2022.    Allergies (verified) Patient has no known allergies.   History: Past Medical History:  Diagnosis Date   Anxiety    CAD (coronary artery disease) 9/13   s/p CABG   Cardiac arrest due to underlying cardiac condition (HCC)    successfully rescucitated/ cooled   Cardiac defibrillator in place    Cerebrovascular disease 10/15/2012   Chronic systolic dysfunction of left ventricle    GERD (gastroesophageal reflux disease)    Headache(784.0)    Hyperlipidemia    Hypertension    Infection of pacemaker pocket [facility])    of ICD with subsequent explant   Ischemic cardiomyopathy    s/p BiV ICD implant Feb 2014 - [address]. [name] [place], model 4098JX-91 (serial # [id number]) right atrial lead and a [name] [place], model 3086-57Q (serial number [id number]) right ventricular defibrillator lead. [name] Quartet model [id number] (serial number [id number]) LV lead. [name] Quadra [place] model [place] [id number] (serial Number [id number]) biventricular ICD.    Left bundle branch block    Peripheral arterial disease (  HCC)    PONV (postoperative nausea and vomiting)    Renal artery stenosis (HCC)    Shortness of breath dyspnea    with exertion   Spinal headache    Past Surgical History:  Procedure Laterality Date   BI-VENTRICULAR IMPLANTABLE CARDIOVERTER DEFIBRILLATOR [name] 11/25/2012   Procedure: BI-VENTRICULAR IMPLANTABLE CARDIOVERTER DEFIBRILLATOR  (CRT-D);  Surgeon: [name], MD;  [facility];  Service: Cardiovascular;  Laterality: [name];   CAROTID ANGIOGRAM Bilateral 10/14/2012   Procedure: CAROTID  ANGIOGRAM;  Surgeon: [name], MD;  [facility];  Service: Cardiovascular;  Laterality: Bilateral;   CAROTID STENT INSERTION [name] 10/14/2012   Procedure: CAROTID STENT INSERTION;  Surgeon: [name], MD;  [facility];  Service: Cardiovascular;  Laterality: [name];   COLONOSCOPY [name] 08/18/2015   Procedure: COLONOSCOPY;  Surgeon: Malissa HippoNajeeb U Rehman, MD;  Location: [facility]O SU[facility];  Service: Endoscopy;  Laterality: [name];  830   CORONARY ARTERY BYPASS GRAFT  06/11/2012   Procedure: CORONARY ARTERY BYPASS GRAFTING (CABG);  Surgeon: Kerin PernaPeter Van Trigt, MD;  [facility];  Service: Open Heart Surgery;  Laterality: [name];  Coronary Artery Bypass Grafting times four using left internal mammary artery and right greater saphenous vein endoscopically harvested.   EP IMPLANTABLE DEVICE [name] 03/24/2015   Procedure: SubQ ICD Implant;  Surgeon: [name], MD;  [facility];  Service: Cardiovascular;  Laterality: [name];   ICD LEAD REMOVAL Left 10/04/2014   Procedure: REMOVAL Saint Jude ICD with lead;  Surgeon: [name], MD;  [facility];  Service: Cardiovascular;  Laterality: Left;  [name] [facility]   LEFT HEART CATHETERIZATION WITH CORONARY ANGIOGRAM [name] 06/11/2012   Procedure: LEFT HEART CATHETERIZATION WITH CORONARY ANGIOGRAM;  Surgeon: [name], MD;  [facility];  Service: Cardiovascular;  Laterality: [name];   PERCUTANEOUS PLACEMENT INTRAVASCULAR STENT CERVICAL CAROTID ARTERY     SUBQ ICD CHANGEOUT [name] 07/13/2020   Procedure: SUBQ ICD EXPLANT;  Surgeon: Lanier PrudeLambert, Cameron T, MD;  [facility];  Service: Cardiovascular;  Laterality: [name];   Family History  Problem Relation Age of Onset   Cancer Half-Brother        prostate   Cancer Half-Brother        mouth   Social History   Socioeconomic History   Marital status: Married    Spouse name: Elkton LionsLois   Number of children: 2   Years of education: 9   Highest education level: GED  or equivalent  Occupational History   Occupation: disability  Tobacco Use   Smoking status: Every Day    Packs/day: 1.00    Years: 40.00    Total pack years: 40.00    Types: Cigarettes   Smokeless tobacco: Never   Tobacco comments:    Quit 2013, started back; now 1 ppd  Vaping Use   Vaping Use: Never used  Substance and Sexual Activity   Alcohol use: Yes    Alcohol/week: 14.0 standard drinks of alcohol    Types: 14 Cans of beer per week    Comment: couple of drinks most days   Drug use: Yes    Frequency: 2.0 times per week    Types: Marijuana    Comment: 2 per week   Sexual activity: Yes    Comment: married and 2 step-daughters  Other Topics Concern   Not on file  Social History Narrative   Lives in one level home with  his wife.   Bought an older home to remodel as a project since he retired - this keeps him busy and active   Social Determinants of Corporate investment banker Strain: Low Risk  (09/19/2022)   Overall Financial Resource Strain (CARDIA)    Difficulty of Paying Living Expenses: Not hard at all  Food Insecurity: No Food Insecurity (09/19/2022)   Hunger Vital Sign    Worried About Running Out of Food in the Last Year: Never true    Ran Out of Food in the Last Year: Never true  Transportation Needs: No Transportation Needs (09/19/2022)   PRAPARE - Administrator, Civil Service (Medical): No    Lack of Transportation (Non-Medical): No  Physical Activity: Sufficiently Active (09/19/2022)   Exercise Vital Sign    Days of Exercise per Week: 7 days    Minutes of Exercise per Session: 30 min  Stress: No Stress Concern Present (09/19/2022)   [employer] of Occupational Health - Occupational Stress Questionnaire    Feeling of Stress : Not at all  Social Connections: Moderately Isolated (09/19/2022)   Social Connection and Isolation Panel [NHANES]    Frequency of Communication with Friends and Family: Twice a week    Frequency of Social  Gatherings with Friends and Family: Once a week    Attends Religious Services: Never    Database administrator or Organizations: No    Attends Engineer, structural: Never    Marital Status: Married    Tobacco Counseling Ready to quit: Not Answered Counseling given: Not Answered Tobacco comments: Quit 2013, started back; now 1 ppd   Clinical Intake:  Pre-visit preparation completed: Yes  Pain : No/denies pain     Nutritional Risks: None Diabetes: No  How often do you need to have someone help you when you read instructions, pamphlets, or other written materials from your doctor or pharmacy?: 1 - Never What is the last grade level you completed in school?: 9th grade  Diabetic?  No  Interpreter Needed?: No  Information entered by :: Mariam Dollar LPN   Activities of Daily Living    09/19/2022    9:53 AM 09/15/2022    9:22 PM  In your present state of health, do you have any difficulty performing the following activities:  Hearing? 0 0  Vision?  0  Difficulty concentrating or making decisions? 0 0  Walking or climbing stairs? 0 0  Dressing or bathing? 0 0  Doing errands, shopping? 0 0  Preparing Food and eating ? N N  Using the Toilet? N N  In the past six months, have you accidently leaked urine? N N  Do you have problems with loss of bowel control? N N  Managing your Medications? N N  Managing your Finances? N N  Housekeeping or managing your Housekeeping? N N    Patient Care Team: Dettinger, Elige Radon, MD as PCP - General (Family Medicine) Marinus Maw, MD as PCP - Electrophysiology (Cardiology) Lovett Sox, MD as Attending Physician (Cardiothoracic Surgery) Runell Gess, MD as Attending Physician (Cardiology)  Indicate any recent Medical Services you may have received from other than [facility] providers in the past year (date may be approximate).     Assessment:   This is a routine wellness examination for Cevin.  Hearing/Vision screen No  results found.  Dietary issues and exercise activities discussed: Current Exercise Habits: Home exercise routine, Type of exercise: walking, Time (Minutes): 30, Frequency (Times/Week): 7,  Weekly Exercise (Minutes/Week): 210, Intensity: Mild, Exercise limited by: None identified   Goals Addressed             This Visit's Progress    Patient Stated       09/19/2022 AWV Goal: Fall Prevention  Over the next year, patient will decrease their risk for falls by: Using assistive devices, such as a cane or walker, as needed Identifying fall risks within their home and correcting them by: Removing throw rugs Adding handrails to stairs or ramps Removing clutter and keeping a clear pathway throughout the home Increasing light, especially at night Adding shower handles/bars Raising toilet seat Identifying potential personal risk factors for falls: Medication side effects Incontinence/urgency Vestibular dysfunction Hearing loss Musculoskeletal disorders Neurological disorders Orthostatic hypotension         Depression Screen    09/19/2022    9:51 AM 05/17/2022    8:41 AM 08/30/2021   10:45 AM 05/15/2021    8:21 AM 11/14/2020    8:42 AM 08/29/2020   10:44 AM 08/11/2020   10:51 AM  PHQ 2/9 Scores  PHQ - 2 Score 0 0 0 0 0 0 0    Fall Risk    09/19/2022    9:54 AM 09/15/2022    9:22 PM 05/17/2022    8:41 AM 08/30/2021   10:41 AM 05/15/2021    8:21 AM  Fall Risk   Falls in the past year? 0 0 0 0 0  Number falls in past yr:  0  0   Injury with Fall?  0  0   Risk for fall due to :    No Fall Risks   Follow up Falls evaluation completed   Falls prevention discussed     FALL RISK PREVENTION PERTAINING TO THE HOME:  Any stairs in or around the home? Yes  If so, are there any without handrails? No  Home free of loose throw rugs in walkways, pet beds, electrical cords, etc? Yes  Adequate lighting in your home to reduce risk of falls? Yes   ASSISTIVE DEVICES UTILIZED TO PREVENT  FALLS:  Life alert? No  Use of a cane, walker or w/c? No  Grab bars in the bathroom? No  Shower chair or bench in shower? Yes  Elevated toilet seat or a handicapped toilet? No   TIMED UP AND GO:  Was the test performed? No .    Cognitive Function:        09/19/2022    9:54 AM 08/29/2020   10:45 AM 08/27/2019   10:43 AM  6CIT Screen  What Year? 0 points 0 points 0 points  What month? 0 points 0 points 0 points  What time? 0 points 0 points 0 points  Count back from 20 0 points 0 points 0 points  Months in reverse 0 points 0 points 0 points  Repeat phrase 0 points 0 points 0 points  Total Score 0 points 0 points 0 points    Immunizations Immunization History  Administered Date(s) Administered   Fluad Quad(high Dose 65+) 07/14/2020   Influenza Split 06/18/2012, 07/08/2014, 07/17/2017   Influenza, High Dose Seasonal PF 07/28/2018   Influenza, Quadrivalent, Recombinant, Inj, Pf 07/22/2019   Influenza,inj,Quad PF,6+ Mos 07/28/2018   Influenza,trivalent, recombinat, inj, PF 07/08/2014, 07/17/2017   Moderna SARS-COV2 Booster Vaccination 01/23/2021   Moderna Sars-Covid-2 Vaccination 12/28/2019, 01/25/2020, 08/31/2020   PNEUMOCOCCAL CONJUGATE-20 11/15/2021   Pneumococcal Conjugate-13 11/14/2020   Pneumococcal Polysaccharide-23 10/09/2011, 06/17/2012   Tdap 10/09/2011, 11/15/2021  TDAP status: Up to date  Flu Vaccine status: Up to date  Pneumococcal vaccine status: Up to date  Covid-19 vaccine status: Completed vaccines  Qualifies for Shingles Vaccine? Yes   Zostavax completed No   Shingrix Completed?: No.    Education has been provided regarding the importance of this vaccine. Patient has been advised to call insurance company to determine out of pocket expense if they have not yet received this vaccine. Advised may also receive vaccine at local pharmacy or Health Dept. Verbalized acceptance and understanding.  Screening Tests Health Maintenance  Topic Date Due    Lung Cancer Screening  Never done   Zoster Vaccines- Shingrix (1 of 2) Never done   COVID-19 Vaccine (5 - 2023-24 season) 06/08/2022   Medicare Annual Wellness (AWV)  08/30/2022   INFLUENZA VACCINE  01/06/2023 (Originally 05/08/2022)   COLONOSCOPY (Pts 45-36yrs Insurance coverage will need to be confirmed)  08/17/2025   DTaP/Tdap/Td (3 - Td or Tdap) 11/16/2031   Pneumonia Vaccine 29+ Years old  Completed   Hepatitis C Screening  Completed   HPV VACCINES  Aged Out    Health Maintenance  Health Maintenance Due  Topic Date Due   Lung Cancer Screening  Never done   Zoster Vaccines- Shingrix (1 of 2) Never done   COVID-19 Vaccine (5 - 2023-24 season) 06/08/2022   Medicare Annual Wellness (AWV)  08/30/2022    Colorectal cancer screening: Type of screening: Colonoscopy. Completed 08/18/2015. Repeat every 10 years  Lung Cancer Screening: (Low Dose CT Chest recommended if Age 59-80 years, 30 pack-year currently smoking OR have quit w/in 15years.) does qualify.   Lung Cancer Screening Referral: Needs to be done  Additional Screening:  Hepatitis C Screening: does not qualify, completed 08/26/2019  Vision Screening: Recommended annual ophthalmology exams for early detection of glaucoma and other disorders of the eye. Is the patient up to date with their annual eye exam?  Yes  Who is the provider or what is the name of the office in which the patient attends annual eye exams? [facility], [place] If pt is not established with a provider, would they like to be referred to a provider to establish care?  [name] .   Dental Screening: Recommended annual dental exams for proper oral hygiene  Community Resource Referral / Chronic Care Management: CRR required this visit?  No   CCM required this visit?  No      Plan:     I have personally reviewed and noted the following in the patient's chart:   Medical and social history Use of alcohol, tobacco or illicit drugs  Current  medications and supplements including opioid prescriptions. Patient is not currently taking opioid prescriptions. Functional ability and status Nutritional status Physical activity Advanced directives List of other physicians Hospitalizations, surgeries, and ER visits in previous 12 months Vitals Screenings to include cognitive, depression, and falls Referrals and appointments  In addition, I have reviewed and discussed with patient certain preventive protocols, quality metrics, and best practice recommendations. A written personalized care plan for preventive services as well as general preventive health recommendations were provided to patient.     Mariam Dollar LPN     [id number]

## 2022-09-19 NOTE — Patient Instructions (Signed)
  [name] , Thank you for taking time to come for your Medicare Wellness Visit. I appreciate your ongoing commitment to your health goals. Please review the following plan we discussed and let me know if I can assist you in the future.   These are the goals we discussed:  Goals      DIET - INCREASE WATER INTAKE     Try to drink 6-8 glasses of water daily     Exercise 3x per week (30 min per time)     08/29/2020 AWV Goal: Exercise for General Health  Patient will verbalize understanding of the benefits of increased physical activity: Exercising regularly is important. It will improve your overall fitness, flexibility, and endurance. Regular exercise also will improve your overall health. It can help you control your weight, reduce stress, and improve your bone density. Over the next year, patient will increase physical activity as tolerated with a goal of at least 150 minutes of moderate physical activity per week.  You can tell that you are exercising at a moderate intensity if your heart starts beating faster and you start breathing faster but can still hold a conversation. Moderate-intensity exercise ideas include: Walking 1 mile (1.6 km) in about 15 minutes Biking Hiking Golfing Dancing Water aerobics Patient will verbalize understanding of everyday activities that increase physical activity by providing examples like the following: Yard work, such as: Insurance underwriter Gardening Washing windows or floors Patient will be able to explain general safety guidelines for exercising:  Before you start a new exercise program, talk with your health care provider. Do not exercise so much that you hurt yourself, feel dizzy, or get very short of breath. Wear comfortable clothes and wear shoes with good support. Drink plenty of water while you exercise to prevent dehydration or heat stroke. Work out until  your breathing and your heartbeat get faster.      Patient Stated     09/19/2022 AWV Goal: Fall Prevention  Over the next year, patient will decrease their risk for falls by: Using assistive devices, such as a cane or walker, as needed Identifying fall risks within their home and correcting them by: Removing throw rugs Adding handrails to stairs or ramps Removing clutter and keeping a clear pathway throughout the home Increasing light, especially at night Adding shower handles/bars Raising toilet seat Identifying potential personal risk factors for falls: Medication side effects Incontinence/urgency Vestibular dysfunction Hearing loss Musculoskeletal disorders Neurological disorders Orthostatic hypotension          This is a list of the screening recommended for you and due dates:  Health Maintenance  Topic Date Due   Screening for Lung Cancer  Never done   Zoster (Shingles) Vaccine (1 of 2) Never done   COVID-19 Vaccine (5 - 2023-24 season) 06/08/2022   Medicare Annual Wellness Visit  08/30/2022   Flu Shot  01/06/2023*   Colon Cancer Screening  08/17/2025   DTaP/Tdap/Td vaccine (3 - Td or Tdap) 11/16/2031   Pneumonia Vaccine  Completed   Hepatitis C Screening: USPSTF Recommendation to screen - Ages 25-79 yo.  Completed   HPV Vaccine  Aged Out  *Topic was postponed. The date shown is not the original due date.

## 2022-09-29 ENCOUNTER — Other Ambulatory Visit: Payer: Self-pay | Admitting: Cardiovascular Disease

## 2022-10-01 ENCOUNTER — Other Ambulatory Visit: Payer: Self-pay | Admitting: Cardiovascular Disease

## 2022-10-05 DIAGNOSIS — I255 Ischemic cardiomyopathy: Secondary | ICD-10-CM | POA: Diagnosis not present

## 2022-10-06 LAB — BASIC METABOLIC PANEL
BUN/Creatinine Ratio: 14 (ref 10–24)
BUN: 15 mg/dL (ref 8–27)
CO2: 21 mmol/L (ref 20–29)
Calcium: 10.6 mg/dL — ABNORMAL HIGH (ref 8.6–10.2)
Chloride: 100 mmol/L (ref 96–106)
Creatinine, Ser: 1.07 mg/dL (ref 0.76–1.27)
Glucose: 97 mg/dL (ref 70–99)
Potassium: 5.3 mmol/L — ABNORMAL HIGH (ref 3.5–5.2)
Sodium: 138 mmol/L (ref 134–144)
eGFR: 76 mL/min/{1.73_m2} (ref >59)

## 2022-10-12 NOTE — Telephone Encounter (Signed)
Labs looks normal after switching ACE to ARNI,  Spoke to patient, tolerates Entresto 24/26 well. K level was 5.3 (normal range 3.5-5.2 ) discuss to watch for potassium containing salt substitute if using any and consume high potassium food in moderation.

## 2022-10-17 ENCOUNTER — Ambulatory Visit: Payer: Medicare HMO | Attending: Cardiology | Admitting: Pharmacist Clinician (PhC)/ Clinical Pharmacy Specialist

## 2022-10-17 ENCOUNTER — Encounter: Payer: Self-pay | Admitting: Pharmacist Clinician (PhC)/ Clinical Pharmacy Specialist

## 2022-10-17 VITALS — BP 154/87 | HR 75

## 2022-10-17 DIAGNOSIS — I255 Ischemic cardiomyopathy: Secondary | ICD-10-CM | POA: Diagnosis not present

## 2022-10-17 MED ORDER — DAPAGLIFLOZIN PROPANEDIOL 10 MG PO TABS: 10.0000 mg | ORAL_TABLET | Freq: Every day | ORAL | 6 refills | Status: DC

## 2022-10-17 MED ORDER — DAPAGLIFLOZIN PROPANEDIOL 10 MG PO TABS: 10.0000 mg | ORAL_TABLET | Freq: Every day | ORAL | 0 refills | Status: DC

## 2022-10-17 NOTE — Patient Instructions (Signed)
Follow up appointment: with Dr. Gwenlyn Found in May  Take your meds as follows:  Start Farxiga samples - take 1 tablet (10 mg) once daily.  We will send a prescription to the pharmacy for the generic form of this - dapagliflozin 10 mg.   If the insurance company won't pay for it, please let me know.  (Send a message through [employer])  Continue with all other medications  Hypertension "High blood pressure"  Hypertension is often called "The Silent Killer." It rarely causes symptoms until it is extremely  high or has done damage to other organs in the body. For this reason, you should have your  blood pressure checked regularly by your physician. We will check your blood pressure  every time you see a provider at one of our offices.   Your blood pressure reading consists of two numbers. Ideally, blood pressure should be  below 120/80. The first ("top") number is called the systolic pressure. It measures the  pressure in your arteries as your heart beats. The second ("bottom") number is called the diastolic pressure. It measures the pressure in your arteries as the heart relaxes between beats.  The benefits of getting your blood pressure under control are enormous. A 10-point  reduction in systolic blood pressure can reduce your risk of stroke by 27% and heart failure by 28%  Your blood pressure goal is <130/80  To check your pressure at home you will need to:  1. Sit up in a chair, with feet flat on the floor and back supported. Do not cross your ankles or legs. 2. Rest your left arm so that the cuff is about heart level. If the cuff goes on your upper arm,  then just relax the arm on the table, arm of the chair or your lap. If you have a wrist cuff, we  suggest relaxing your wrist against your chest (think of it as Pledging the Flag with the  wrong arm).  3. Place the cuff snugly around your arm, about 1 inch above the crook of your elbow. The  cords should be inside the groove of your  elbow.  4. Sit quietly, with the cuff in place, for about 5 minutes. After that 5 minutes press the power  button to start a reading. 5. Do not talk or move while the reading is taking place.  6. Record your readings on a sheet of paper. Although most cuffs have a memory, it is often  easier to see a pattern developing when the numbers are all in front of you.  7. You can repeat the reading after 1-3 minutes if it is recommended  Make sure your bladder is empty and you have not had caffeine or tobacco within the last 30 min  Always bring your blood pressure log with you to your appointments. If you have not brought your monitor in to be double checked for accuracy, please bring it to your next appointment.  You can find a list of quality blood pressure cuffs at [email or web address]

## 2022-10-17 NOTE — Progress Notes (Unsigned)
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 male patient of Dr Gwenlyn Found in the office today for medication titration.  He was seen last moth by Cammy Copa PharmD, with an elevated BP at 152/70.  Lisinopril was discontinued and after a 72 hour washout Entresto 24/26 mg was started twice daily.   Today he has his home BP cuff and it appears accurate.  Most recent labs show potassium high at 5.3 and he was advised to minimize potassium intake in hopes of continuing Entresto and spironolactone.    Blood Pressure Goal:  130/80  GDMT: ACEI/ARB/ARNI [x] Yes [] No Entresto 24/26 mg bid  Beta blocker [x] Yes [] No Carvedilol 25 mg bid  MRA [] Yes [x] No Elevated potasium  SGLT2 inhibitor [] Yes [x] No    Social Hx:      Tobacco: smokes daily, quit from 2013 to last year (wife still smokes)  Alcohol: daily   Caffeine: 3 cups of coffee daily; Mt. Dew daily  Diet:    doesn't eat out much; vegetables (fresh/frozen and canned), all meats; snacks on nuts  Exercise: just staying active around home, does golf in summer  Home BP readings:      20 AM readings in past month average 124/61 (range 107-146/54-74)  HR 60  Some afternoon and evening readings all falling within above range  Adherence Assessment   Do you ever forget to take your medication? [] Yes [x] No  Do you ever skip doses due to side effects? [] Yes [x] No  Do you have trouble affording your medicines? Not on any brand name medications so does not have problem so far  but prefers to get enrolled in the grant for [facility] co-pay [] Yes [x] No  Are you ever unable to pick up your medication due to transportation difficulties? [] Yes [x] No  Do you ever stop taking your medications because you don't believe they are helping? [] Yes [x] No  Do you check your weight daily? [] Yes [x] No    Adherence strategy: alarm on the phone and set the pill bottle right on the table so remember to take it - may forget once month second dose of carvedilol   Barriers to obtaining medications: None  Accessory Clinical Findings    Lab Results  Component Value Date   CREATININE 1.07 10/05/2022   BUN 15 10/05/2022   NA 138 10/05/2022   K 5.3 (H) 10/05/2022   CL 100 10/05/2022   CO2 21 10/05/2022   Lab Results  Component Value Date   ALT 15 05/15/2022   AST 18 05/15/2022   ALKPHOS 77 05/15/2022   BILITOT 0.5 05/15/2022   Lab Results  Component Value Date   HGBA1C 5.3 04/24/2019    Home Medications/Allergies    Current Outpatient Medications  Medication Sig Dispense Refill   dapagliflozin propanediol (FARXIGA) 10 MG TABS tablet Take 1 tablet (10 mg total) by mouth daily before breakfast. 30 tablet 6   dapagliflozin propanediol (FARXIGA) 10 MG TABS tablet Take 1 tablet (10 mg total) by mouth daily  before breakfast. 21 tablet 0   aspirin 81 MG tablet Take 1 tablet (81 mg total) by mouth daily. 30 tablet    carvedilol (COREG) 25 MG tablet TAKE 1 TABLET BY MOUTH TWICE DAILY WITH A MEAL 60 tablet 11   clopidogrel (PLAVIX) 75 MG tablet TAKE 1 TABLET BY MOUTH ONCE DAILY . APPOINTMENT REQUIRED FOR FUTURE REFILLS 90 tablet 2   ezetimibe (ZETIA) 10 MG tablet Take 1 tablet (10 mg total) by mouth daily. 90 tablet 3   fenofibrate (TRICOR) 145 MG tablet Take 1 tablet (145 mg total) by mouth daily. 90 tablet 3   mirtazapine (REMERON) 30 MG tablet Take 1 tablet (30 mg total) by mouth at bedtime. (Patient not taking: Reported on 08/09/2022) 90 tablet 3   omeprazole (PRILOSEC) 40 MG  capsule Take 1 capsule (40 mg total) by mouth daily. 90 capsule 3   rosuvastatin (CRESTOR) 40 MG tablet Take 1 tablet (40 mg total) by mouth daily. 90 tablet 3   sacubitril-valsartan (ENTRESTO) 24-26 MG Take 1 tablet by mouth 2 (two) times daily. 60 tablet 1   No current facility-administered medications for this visit.     No Known Allergies     Assessment & Plan   No BP recorded.  {Refresh Note OR Click here to enter BP  :1}***  Ischemic cardiomyopathy Assessment: BP in office today is 154/87 Patient with HFrEF  HFmrEF  HFpEF Tolerates current medications without any side effects Denies SOB, palpitation, chest pain, headaches,or edema No concerns with regards to cost of medications, compliance, ability to pick up at pharmacy Reiterated the importance of regular exercise and low salt diet  Plan: GDMT ACEI/ARB/ARNI Entresto 24/26 Unable to increase 2/2 elevated K  Beta blocker Carvedilol 25 mg bid   MRA -  Elevated K  SGLT2 dapagliflozin Started 12/10   Labs orderd:  none Follow up with Dr. Gwenlyn Found in [place] PharmD [facility] [place]  [address] [place], [place] [address] [phone]

## 2022-10-20 ENCOUNTER — Encounter: Payer: Self-pay | Admitting: Pharmacist Clinician (PhC)/ Clinical Pharmacy Specialist

## 2022-10-20 NOTE — Assessment & Plan Note (Signed)
Assessment: BP in office today is 154/87 Patient with HFrEF  HFmrEF  HFpEF Tolerates current medications without any side effects Denies SOB, palpitation, chest pain, headaches,or edema No concerns with regards to cost of medications, compliance, ability to pick up at pharmacy Reiterated the importance of regular exercise and low salt diet  Plan: GDMT ACEI/ARB/ARNI Entresto 24/26 Unable to increase 2/2 elevated K  Beta blocker Carvedilol 25 mg bid   MRA -  Elevated K  SGLT2 dapagliflozin Started 12/10   Labs orderd:  none Follow up with Dr. Gwenlyn Found in May

## 2022-11-07 ENCOUNTER — Other Ambulatory Visit: Payer: Self-pay | Admitting: Family Medicine

## 2022-11-07 DIAGNOSIS — K219 Gastro-esophageal reflux disease without esophagitis: Secondary | ICD-10-CM

## 2022-11-09 ENCOUNTER — Other Ambulatory Visit: Payer: Self-pay | Admitting: Cardiovascular Disease

## 2022-11-14 ENCOUNTER — Telehealth: Payer: Self-pay | Admitting: Family Medicine

## 2022-11-14 DIAGNOSIS — E782 Mixed hyperlipidemia: Secondary | ICD-10-CM

## 2022-11-14 DIAGNOSIS — I10 Essential (primary) hypertension: Secondary | ICD-10-CM

## 2022-11-14 NOTE — Telephone Encounter (Signed)
Future orders placed 

## 2022-11-15 ENCOUNTER — Other Ambulatory Visit: Payer: Medicare HMO

## 2022-11-15 DIAGNOSIS — E782 Mixed hyperlipidemia: Secondary | ICD-10-CM

## 2022-11-15 DIAGNOSIS — Z125 Encounter for screening for malignant neoplasm of prostate: Secondary | ICD-10-CM | POA: Diagnosis not present

## 2022-11-15 DIAGNOSIS — I10 Essential (primary) hypertension: Secondary | ICD-10-CM | POA: Diagnosis not present

## 2022-11-15 DIAGNOSIS — R739 Hyperglycemia, unspecified: Secondary | ICD-10-CM | POA: Diagnosis not present

## 2022-11-15 LAB — BAYER DCA HB A1C WAIVED: HB A1C (BAYER DCA - WAIVED): 5.4 % (ref 4.8–5.6)

## 2022-11-16 LAB — CBC WITH DIFFERENTIAL/PLATELET
Basophils Absolute: 0.1 10*3/uL (ref 0.0–0.2)
Basos: 1 %
EOS (ABSOLUTE): 0.6 10*3/uL — ABNORMAL HIGH (ref 0.0–0.4)
Eos: 8 %
Hematocrit: 47.9 % (ref 37.5–51.0)
Hemoglobin: 15.5 g/dL (ref 13.0–17.7)
Immature Grans (Abs): 0 10*3/uL (ref 0.0–0.1)
Immature Granulocytes: 0 %
Lymphocytes Absolute: 2.2 10*3/uL (ref 0.7–3.1)
Lymphs: 26 %
MCH: 29.2 pg (ref 26.6–33.0)
MCHC: 32.4 g/dL (ref 31.5–35.7)
MCV: 90 fL (ref 79–97)
Monocytes Absolute: 0.6 10*3/uL (ref 0.1–0.9)
Monocytes: 7 %
Neutrophils Absolute: 5 10*3/uL (ref 1.4–7.0)
Neutrophils: 58 %
Platelets: 341 10*3/uL (ref 150–450)
RBC: 5.31 x10E6/uL (ref 4.14–5.80)
RDW: 13 % (ref 11.6–15.4)
WBC: 8.4 10*3/uL (ref 3.4–10.8)

## 2022-11-16 LAB — CMP14+EGFR
ALT: 11 IU/L (ref 0–44)
AST: 19 IU/L (ref 0–40)
Albumin/Globulin Ratio: 1.9 (ref 1.2–2.2)
Albumin: 4.9 g/dL (ref 3.9–4.9)
Alkaline Phosphatase: 71 IU/L (ref 44–121)
BUN/Creatinine Ratio: 11 (ref 10–24)
BUN: 14 mg/dL (ref 8–27)
Bilirubin Total: 0.3 mg/dL (ref 0.0–1.2)
CO2: 24 mmol/L (ref 20–29)
Calcium: 10.5 mg/dL — ABNORMAL HIGH (ref 8.6–10.2)
Chloride: 101 mmol/L (ref 96–106)
Creatinine, Ser: 1.25 mg/dL (ref 0.76–1.27)
Globulin, Total: 2.6 g/dL (ref 1.5–4.5)
Glucose: 101 mg/dL — ABNORMAL HIGH (ref 70–99)
Potassium: 5 mmol/L (ref 3.5–5.2)
Sodium: 138 mmol/L (ref 134–144)
Total Protein: 7.5 g/dL (ref 6.0–8.5)
eGFR: 63 mL/min/{1.73_m2} (ref >59)

## 2022-11-16 LAB — LIPID PANEL
Chol/HDL Ratio: 5 ratio (ref 0.0–5.0)
Cholesterol, Total: 139 mg/dL (ref 100–199)
HDL: 28 mg/dL — ABNORMAL LOW (ref >39)
LDL Chol Calc (NIH): 74 mg/dL (ref 0–99)
Triglycerides: 225 mg/dL — ABNORMAL HIGH (ref 0–149)
VLDL Cholesterol Cal: 37 mg/dL (ref 5–40)

## 2022-11-16 LAB — PSA, TOTAL AND FREE
PSA, Free Pct: 32.1 %
PSA, Free: 0.45 ng/mL
Prostate Specific Ag, Serum: 1.4 ng/mL (ref 0.0–4.0)

## 2022-11-19 ENCOUNTER — Encounter: Payer: Self-pay | Admitting: Family Medicine

## 2022-11-19 ENCOUNTER — Ambulatory Visit (INDEPENDENT_AMBULATORY_CARE_PROVIDER_SITE_OTHER): Payer: Medicare HMO | Admitting: Family Medicine

## 2022-11-19 VITALS — BP 132/76 | HR 59 | Ht 70.0 in | Wt 159.0 lb

## 2022-11-19 DIAGNOSIS — I2583 Coronary atherosclerosis due to lipid rich plaque: Secondary | ICD-10-CM | POA: Diagnosis not present

## 2022-11-19 DIAGNOSIS — K219 Gastro-esophageal reflux disease without esophagitis: Secondary | ICD-10-CM

## 2022-11-19 DIAGNOSIS — I2510 Atherosclerotic heart disease of native coronary artery without angina pectoris: Secondary | ICD-10-CM

## 2022-11-19 DIAGNOSIS — Z0000 Encounter for general adult medical examination without abnormal findings: Secondary | ICD-10-CM

## 2022-11-19 DIAGNOSIS — Z0001 Encounter for general adult medical examination with abnormal findings: Secondary | ICD-10-CM | POA: Diagnosis not present

## 2022-11-19 DIAGNOSIS — I10 Essential (primary) hypertension: Secondary | ICD-10-CM | POA: Diagnosis not present

## 2022-11-19 DIAGNOSIS — E782 Mixed hyperlipidemia: Secondary | ICD-10-CM | POA: Diagnosis not present

## 2022-11-19 MED ORDER — OMEPRAZOLE 40 MG PO CPDR: 40.0000 mg | DELAYED_RELEASE_CAPSULE | Freq: Every day | ORAL | 3 refills | Status: DC

## 2022-11-19 MED ORDER — ROSUVASTATIN CALCIUM 40 MG PO TABS: 40.0000 mg | ORAL_TABLET | Freq: Every day | ORAL | 3 refills | Status: DC

## 2022-11-19 NOTE — Progress Notes (Signed)
BP 132/76   Pulse (!) 59   Ht 5' 10"$  (1.778 m)   Wt 159 lb (72.1 kg)   SpO2 98%   BMI 22.81 kg/m    Subjective:   Patient ID: Jonathon Ryan, male    DOB: Sep 20, 1956, 67 y.o.   MRN: [id number]  HPI: Jonathon Ryan is a 67 y.o. male presenting on 11/19/2022 for Medical Management of Chronic Issues, Hyperlipidemia, and Hypertension   HPI Physical exam Patient denies any chest pain, shortness of breath, headaches or vision issues, abdominal complaints, diarrhea, nausea, vomiting, or joint issues.  We discussed cigarette smoking and a CAT scan for lung cancer screening he is thinking about both but wants to hold off for now.  Hypertension Patient is currently on carvedilol and Entresto and Farxiga, and their blood pressure today is 132/76. Patient denies any lightheadedness or dizziness. Patient denies headaches, blurred vision, chest pains, shortness of breath, or weakness. Denies any side effects from medication and is content with current medication.   Hyperlipidemia and CAD Patient is coming in for recheck of his hyperlipidemia. The patient is currently taking [place] and Zetia and fenofibrate and Crestor. They deny any issues with myalgias or history of liver damage from it. They deny any focal numbness or weakness or chest pain.   Relevant past medical, surgical, family and social history reviewed and updated as indicated. Interim medical history since our last visit reviewed. Allergies and medications reviewed and updated.  Review of Systems  Constitutional:  Negative for chills and fever.  Eyes:  Negative for visual disturbance.  Respiratory:  Negative for cough, shortness of breath and wheezing.   Cardiovascular:  Negative for chest pain and leg swelling.  Musculoskeletal:  Negative for back pain and gait problem.  Skin:  Negative for rash.  Neurological:  Negative for dizziness, weakness and numbness.  All other systems reviewed and are negative.   Per HPI unless  specifically indicated above   Allergies as of 11/19/2022   No Known Allergies      Medication List        Accurate as of November 19, 2022  8:26 AM. If you have any questions, ask your nurse or doctor.          aspirin 81 MG tablet Take 1 tablet (81 mg total) by mouth daily.   carvedilol 25 MG tablet Commonly known as: COREG TAKE 1 TABLET BY MOUTH TWICE DAILY WITH A MEAL   clopidogrel 75 MG tablet Commonly known as: PLAVIX TAKE 1 TABLET BY MOUTH ONCE DAILY . APPOINTMENT REQUIRED FOR FUTURE REFILLS   dapagliflozin propanediol 10 MG Tabs tablet Commonly known as: Farxiga Take 1 tablet (10 mg total) by mouth daily before breakfast. What changed: Another medication with the same name was removed. Continue taking this medication, and follow the directions you see here. Changed by: Worthy Rancher, MD   Delene Loll 24-26 MG Generic drug: sacubitril-valsartan Take 1 tablet by mouth twice daily   ezetimibe 10 MG tablet Commonly known as: ZETIA Take 1 tablet (10 mg total) by mouth daily.   fenofibrate 145 MG tablet Commonly known as: TRICOR Take 1 tablet (145 mg total) by mouth daily.   mirtazapine 30 MG tablet Commonly known as: REMERON Take 1 tablet (30 mg total) by mouth at bedtime.   omeprazole 40 MG capsule Commonly known as: PRILOSEC Take 1 capsule by mouth once daily   rosuvastatin 40 MG tablet Commonly known as: CRESTOR Take 1 tablet (40 mg total)  by mouth daily.         Objective:   BP 132/76   Pulse (!) 59   Ht 5' 10"$  (1.778 m)   Wt 159 lb (72.1 kg)   SpO2 98%   BMI 22.81 kg/m   Wt Readings from Last 3 Encounters:  11/19/22 159 lb (72.1 kg)  08/09/22 159 lb 9.6 oz (72.4 kg)  05/17/22 158 lb (71.7 kg)    Physical Exam Vitals and nursing note reviewed.  Constitutional:      General: He is not in acute distress.    Appearance: He is well-developed. He is not diaphoretic.  HENT:     Right Ear: Tympanic membrane and ear canal normal.      Left Ear: Tympanic membrane and ear canal normal.     Mouth/Throat:     Mouth: Mucous membranes are moist.     Pharynx: Oropharynx is clear. No oropharyngeal exudate or posterior oropharyngeal erythema.  Eyes:     General: No scleral icterus.    Conjunctiva/sclera: Conjunctivae normal.  Neck:     Thyroid: No thyromegaly.  Cardiovascular:     Rate and Rhythm: Normal rate and regular rhythm.     Heart sounds: Normal heart sounds. No murmur heard. Pulmonary:     Effort: Pulmonary effort is normal. No respiratory distress.     Breath sounds: Normal breath sounds. No wheezing.  Musculoskeletal:        General: No swelling. Normal range of motion.     Cervical back: Neck supple.  Lymphadenopathy:     Cervical: No cervical adenopathy.  Skin:    General: Skin is warm and dry.     Findings: No rash.  Neurological:     Mental Status: He is alert and oriented to person, place, and time.     Coordination: Coordination normal.  Psychiatric:        Behavior: Behavior normal.     Results for orders placed or performed in visit on 11/15/22  PSA, total and free  Result Value Ref Range   Prostate Specific Ag, Serum 1.4 0.0 - 4.0 ng/mL   PSA, Free 0.45 [name] ng/mL   PSA, Free Pct 32.1 %  CBC with Differential/Platelet  Result Value Ref Range   WBC 8.4 3.4 - 10.8 x10E3/uL   RBC 5.31 4.14 - 5.80 x10E6/uL   Hemoglobin 15.5 13.0 - 17.7 g/dL   Hematocrit 47.9 37.5 - 51.0 %   MCV 90 79 - 97 fL   MCH 29.2 26.6 - 33.0 pg   MCHC 32.4 31.5 - 35.7 g/dL   RDW 13.0 11.6 - 15.4 %   Platelets 341 150 - 450 x10E3/uL   Neutrophils 58 Not Estab. %   Lymphs 26 Not Estab. %   Monocytes 7 Not Estab. %   Eos 8 Not Estab. %   Basos 1 Not Estab. %   Neutrophils Absolute 5.0 1.4 - 7.0 x10E3/uL   Lymphocytes Absolute 2.2 0.7 - 3.1 x10E3/uL   Monocytes Absolute 0.6 0.1 - 0.9 x10E3/uL   EOS (ABSOLUTE) 0.6 (H) 0.0 - 0.4 x10E3/uL   Basophils Absolute 0.1 0.0 - 0.2 x10E3/uL   Immature Granulocytes 0 Not  Estab. %   Immature Grans (Abs) 0.0 0.0 - 0.1 x10E3/uL  CMP14+EGFR  Result Value Ref Range   Glucose 101 (H) 70 - 99 mg/dL   BUN 14 8 - 27 mg/dL   Creatinine, Ser 1.25 0.76 - 1.27 mg/dL   eGFR 63 >59 mL/min/1.73   BUN/Creatinine Ratio  11 10 - 24   Sodium 138 134 - 144 mmol/L   Potassium 5.0 3.5 - 5.2 mmol/L   Chloride 101 96 - 106 mmol/L   CO2 24 20 - 29 mmol/L   Calcium 10.5 (H) 8.6 - 10.2 mg/dL   Total Protein 7.5 6.0 - 8.5 g/dL   Albumin 4.9 3.9 - 4.9 g/dL   Globulin, Total 2.6 1.5 - 4.5 g/dL   Albumin/Globulin Ratio 1.9 1.2 - 2.2   Bilirubin Total 0.3 0.0 - 1.2 mg/dL   Alkaline Phosphatase 71 44 - 121 IU/L   AST 19 0 - 40 IU/L   ALT 11 0 - 44 IU/L  Lipid panel  Result Value Ref Range   Cholesterol, Total 139 100 - 199 mg/dL   Triglycerides 225 (H) 0 - 149 mg/dL   HDL 28 (L) >39 mg/dL   VLDL Cholesterol Cal 37 5 - 40 mg/dL   LDL Chol Calc (NIH) 74 0 - 99 mg/dL   Chol/HDL Ratio 5.0 0.0 - 5.0 ratio  Bayer DCA Hb A1c Waived  Result Value Ref Range   HB A1C (BAYER DCA - WAIVED) 5.4 4.8 - 5.6 %    Assessment & Plan:   Problem List Items Addressed This Visit       Cardiovascular and Mediastinum   Hypertension   CAD, 90% LM, Total RCA, 80-90% CFX- S/P urgent CABG X 11 Jun 2012     Digestive   GERD (gastroesophageal reflux disease)     Other   HLD (hyperlipidemia)   Other Visit Diagnoses     Physical exam    -  Primary     Patient sees cardiology who manages most of his cardiac issues.  His triglycerides are still slightly elevated although he is already on fenofibrate at maximal dose.  Recommended he discuss any further treatment with cardiology.  His LDL is slightly more elevated than it has been.  Focus on diet, should be just below 70.  Patient declines CAT scan for lung cancer screening today  Blood work other than that looked good.  Follow up plan: Return in about 6 months (around 05/20/2023), or if symptoms worsen or fail to improve, for Hypertension and  hyperlipidemia.  Counseling provided for all of the vaccine components No orders of the defined types were placed in this encounter.   Caryl Pina, MD [facility] 11/19/2022, 8:26 AM

## 2023-02-12 ENCOUNTER — Encounter: Payer: Self-pay | Admitting: Cardiovascular Disease

## 2023-02-12 ENCOUNTER — Ambulatory Visit: Payer: Medicare HMO | Attending: Cardiovascular Disease | Admitting: Cardiovascular Disease

## 2023-02-12 VITALS — BP 132/84 | HR 52 | Ht 70.0 in | Wt 157.8 lb

## 2023-02-12 DIAGNOSIS — I6522 Occlusion and stenosis of left carotid artery: Secondary | ICD-10-CM | POA: Diagnosis not present

## 2023-02-12 DIAGNOSIS — Z95810 Presence of automatic (implantable) cardiac defibrillator: Secondary | ICD-10-CM

## 2023-02-12 DIAGNOSIS — E782 Mixed hyperlipidemia: Secondary | ICD-10-CM | POA: Diagnosis not present

## 2023-02-12 DIAGNOSIS — I447 Left bundle-branch block, unspecified: Secondary | ICD-10-CM | POA: Diagnosis not present

## 2023-02-12 DIAGNOSIS — I739 Peripheral vascular disease, unspecified: Secondary | ICD-10-CM | POA: Diagnosis not present

## 2023-02-12 DIAGNOSIS — I701 Atherosclerosis of renal artery: Secondary | ICD-10-CM | POA: Diagnosis not present

## 2023-02-12 DIAGNOSIS — I10 Essential (primary) hypertension: Secondary | ICD-10-CM | POA: Diagnosis not present

## 2023-02-12 DIAGNOSIS — Z8674 Personal history of sudden cardiac arrest: Secondary | ICD-10-CM | POA: Diagnosis not present

## 2023-02-12 DIAGNOSIS — Z720 Tobacco use: Secondary | ICD-10-CM

## 2023-02-12 DIAGNOSIS — I255 Ischemic cardiomyopathy: Secondary | ICD-10-CM

## 2023-02-12 NOTE — Progress Notes (Signed)
02/12/2023 [name]   28-Jul-1956  [id number]  Primary Physician Dettinger, Elige Radon, MD Primary Cardiologist: Runell Gess MD Milagros Loll, [place], [place]  HPI:  Jonathon Ryan is a 67 y.o.   thin-appearing, married Caucasian father of 2 stepchildren, grandfather to 6 grandchildren, who I last saw in the office 08/09/2022.  He had witnessed cardiac arrest with bystander CPR and defibrillation, arctic sun who presented for emergent cardiac catheterization by myself revealing left main 3-vessel disease and he ultimately underwent emergency coronary artery bypass grafting by Dr. Kathlee Nations Trigt with LIMA to his LAD, vein to a PDA, sequential vein to OM1 and 2. His EF was 25% to 30%. He made a full neurologic recovery. He is wearing a LifeVest. His other problems include hypertension and hyperlipidemia and discontinued tobacco abuse which he stopped June 21, 2012. He also has peripheral vascular occlusive disease with high-grade left internal carotid artery stenosis followed by duplex ultrasound, high-grade right renal artery stenosis and right iliac stenosis though he denies claudication. We have gotten Dopplers on both his renals and his lower extremities. He had a Myoview stress test that showed an inferior scar without ischemia with an EF in the 25%-30% range confirmed by 2D echo. I performed carotid stenting on him under the "sapphire protocol" for high risk asymptomatic patients on October 14, 2012, with an excellent result. Followup Dopplers of his carotidis performed on October 28, 2012, were normal. Dr. Hillis Range performed implantation of a BiV ICD on November 25, 2012, which has resulted in marked improvement in him clinically with regards to exercise tolerance. We continue to follow his carotid and renal Dopplers which have remained stable. His last carotid, renal and lower actually a partial Doppler studies performed in the last one 2 years have been stable. He denies claudication. He  did unfortunately suffer a pocket infection of his ICD requiring explant. He also had a subcutaneous ICD placed as followed by [name].   I saw him a year ago he is remained stable.  He continues to not deny chest pain or shortness of breath.   Since I saw him 6 months ago he remained stable.  He is fairly active and is asymptomatic.  He denies chest pain, shortness of breath or claudication.  He has had his GDMT uptitrated by our Pharm.D.'s and is tolerating this well.   Current Meds  Medication Sig   aspirin 81 MG tablet Take 1 tablet (81 mg total) by mouth daily.   carvedilol (COREG) 25 MG tablet TAKE 1 TABLET BY MOUTH TWICE DAILY WITH A MEAL   clopidogrel (PLAVIX) 75 MG tablet TAKE 1 TABLET BY MOUTH ONCE DAILY . APPOINTMENT REQUIRED FOR FUTURE REFILLS   dapagliflozin propanediol (FARXIGA) 10 MG TABS tablet Take 1 tablet (10 mg total) by mouth daily before breakfast.   ezetimibe (ZETIA) 10 MG tablet Take 1 tablet (10 mg total) by mouth daily.   fenofibrate (TRICOR) 145 MG tablet Take 1 tablet (145 mg total) by mouth daily.   omeprazole (PRILOSEC) 40 MG capsule Take 1 capsule (40 mg total) by mouth daily.   rosuvastatin (CRESTOR) 40 MG tablet Take 1 tablet (40 mg total) by mouth daily.   sacubitril-valsartan (ENTRESTO) 24-26 MG Take 1 tablet by mouth twice daily     No Known Allergies  Social History   Socioeconomic History   Marital status: Married    Spouse name: Mannington Lions   Number of children: 2   Years of  education: 9   Highest education level: GED or equivalent  Occupational History   Occupation: disability  Tobacco Use   Smoking status: Every Day    Packs/day: 1.00    Years: 40.00    Additional pack years: 0.00    Total pack years: 40.00    Types: Cigarettes   Smokeless tobacco: Never   Tobacco comments:    Quit 2013, started back; now 1 ppd  Vaping Use   Vaping Use: Never used  Substance and Sexual Activity   Alcohol use: Yes    Alcohol/week: 14.0 standard drinks  of alcohol    Types: 14 Cans of beer per week    Comment: couple of drinks most days   Drug use: Yes    Frequency: 2.0 times per week    Types: Marijuana    Comment: 2 per week   Sexual activity: Yes    Comment: married and 2 step-daughters  Other Topics Concern   Not on file  Social History Narrative   Lives in one level home with his wife.   Bought an older home to remodel as a project since he retired - this keeps him busy and active   Social Determinants of Corporate investment banker Strain: Low Risk  (09/19/2022)   Overall Financial Resource Strain (CARDIA)    Difficulty of Paying Living Expenses: Not hard at all  Food Insecurity: No Food Insecurity (09/19/2022)   Hunger Vital Sign    Worried About Running Out of Food in the Last Year: Never true    Ran Out of Food in the Last Year: Never true  Transportation Needs: No Transportation Needs (09/19/2022)   PRAPARE - Administrator, Civil Service (Medical): No    Lack of Transportation (Non-Medical): No  Physical Activity: Sufficiently Active (09/19/2022)   Exercise Vital Sign    Days of Exercise per Week: 7 days    Minutes of Exercise per Session: 30 min  Stress: No Stress Concern Present (09/19/2022)   [employer] of Occupational Health - Occupational Stress Questionnaire    Feeling of Stress : Not at all  Social Connections: Moderately Isolated (09/19/2022)   Social Connection and Isolation Panel [NHANES]    Frequency of Communication with Friends and Family: Twice a week    Frequency of Social Gatherings with Friends and Family: Once a week    Attends Religious Services: Never    Database administrator or Organizations: No    Attends Banker Meetings: Never    Marital Status: Married  Catering manager Violence: Not At Risk (09/19/2022)   Humiliation, Afraid, Rape, and Kick questionnaire    Fear of Current or Ex-Partner: No    Emotionally Abused: No    Physically Abused: No     Sexually Abused: No     Review of Systems: General: negative for chills, fever, night sweats or weight changes.  Cardiovascular: negative for chest pain, dyspnea on exertion, edema, orthopnea, palpitations, paroxysmal nocturnal dyspnea or shortness of breath Dermatological: negative for rash Respiratory: negative for cough or wheezing Urologic: negative for hematuria Abdominal: negative for nausea, vomiting, diarrhea, bright red blood per rectum, melena, or hematemesis Neurologic: negative for visual changes, syncope, or dizziness All other systems reviewed and are otherwise negative except as noted above.    Blood pressure 132/84, pulse (!) 52, height 5\' 10"  (1.778 m), weight 157 lb 12.8 oz (71.6 kg), SpO2 98 %.  General appearance: alert and no distress Neck: no adenopathy,  no JVD, supple, symmetrical, trachea midline, thyroid not enlarged, symmetric, no tenderness/mass/nodules, and right carotid bruit Lungs: clear to auscultation bilaterally Heart: regular rate and rhythm, S1, S2 normal, no murmur, click, rub or gallop Abdomen: soft, non-tender; bowel sounds normal; no masses,  no organomegaly Extremities: extremities normal, atraumatic, no cyanosis or edema Pulses: 2+ and symmetric Skin: Skin color, texture, turgor normal. No rashes or lesions Neurologic: Grossly normal  EKG sinus bradycardia at 52 with left bundle branch block.  I personally reviewed this EKG.  ASSESSMENT AND PLAN:   Tobacco abuse Ongoing tobacco abuse of 1 pack/day recalcitrant to risk factor modification.  HLD (hyperlipidemia) History of hyperlipidemia on rosuvastatin, few fibrate and Zetia with lipid profile performed 11/15/2022 revealing a total cholesterol 139, LDL 74 and HDL 28.  H/O cardiac arrest, Sept 2013 History of witnessed cardiac arrest with bystander CPR and defibrillation in 2013.  Ischemic cardiomyopathy History of ischemic cardiomyopathy with an EF in the 35% range despite being on  guideline directed optimal medical therapy.  He is asymptomatic.  He did have a BiV ICD placed which was explanted because of infection followed by a subcutaneous ICD placed by [name] which was also explanted because of infection by Dr. Lalla Brothers.  PVD, 90% Rt RAS, 80% Rt Iliac at cath History of peripheral arterial disease with known renal artery stenosis and iliac stenosis by cath in the past.  He is ABIs are normal and he really denies lifestyle-limiting claudication.  His renal Dopplers have remained normal as well.  CAD, 90% LM, Total RCA, 80-90% CFX- S/P urgent CABG X 11 Jun 2012 History of CAD status post urgent catheterization at the time of his cardiac arrest by myself revealing left main/CAD.  He underwent CABG x 3 by Dr. Maren Beach with a LIMA to his LAD, vein to PDA and sequential vein to OM1 into.  He has remained atraumatic from a cardiology point of view since with stress test in the past that showed inferior scar without ischemia.  Carotid artery stenosis, LICA stent 10/14/12 History of carotid artery disease status post left internal carotid artery stenting 10/14/2012 with carotid duplex performed 04/19/2022 revealing a widely patent stent with no evidence of ICA stenosis.  This will be repeated this coming July.  LBBB (left bundle branch block) Chronic  Hypertension History of essential hypertension blood pressure measured today at 132/84.  He is on carvedilol and Entresto.  Renal artery stenosis (HCC) History of renal artery stenosis followed by duplex ultrasound  Automatic implantable cardioverter-defibrillator in situ History of BiV ICD implanted by [name] 11/25/2012.  Unfortunately this was explanted because of pocket infection and he went underwent subcutaneous ICD implantation by [name] which was subsequently explanted by Dr. Lalla Brothers because of pocket infection as well.  He is no longer has an ICD in place.     Runell Gess MD FACP,[name],[name],  [facility] 02/12/2023 8:29 AM

## 2023-02-12 NOTE — Assessment & Plan Note (Signed)
History of witnessed cardiac arrest with bystander CPR and defibrillation in 2013.

## 2023-02-12 NOTE — Assessment & Plan Note (Signed)
History of carotid artery disease status post left internal carotid artery stenting 10/14/2012 with carotid duplex performed 04/19/2022 revealing a widely patent stent with no evidence of ICA stenosis.  This will be repeated this coming July.

## 2023-02-12 NOTE — Assessment & Plan Note (Signed)
Ongoing tobacco abuse of 1 pack/day recalcitrant to risk factor modification. 

## 2023-02-12 NOTE — Assessment & Plan Note (Signed)
History of essential hypertension blood pressure measured today at 132/84.  He is on carvedilol and Entresto.

## 2023-02-12 NOTE — Assessment & Plan Note (Signed)
History of BiV ICD implanted by [name] 11/25/2012.  Unfortunately this was explanted because of pocket infection and he went underwent subcutaneous ICD implantation by [name] which was subsequently explanted by Dr. Lalla Brothers because of pocket infection as well.  He is no longer has an ICD in place.

## 2023-02-12 NOTE — Assessment & Plan Note (Signed)
History of CAD status post urgent catheterization at the time of his cardiac arrest by myself revealing left main/CAD.  He underwent CABG x 3 by Dr. Maren Beach with a LIMA to his LAD, vein to PDA and sequential vein to OM1 into.  He has remained atraumatic from a cardiology point of view since with stress test in the past that showed inferior scar without ischemia.

## 2023-02-12 NOTE — Assessment & Plan Note (Signed)
History of hyperlipidemia on rosuvastatin, few fibrate and Zetia with lipid profile performed 11/15/2022 revealing a total cholesterol 139, LDL 74 and HDL 28.

## 2023-02-12 NOTE — Assessment & Plan Note (Signed)
History of ischemic cardiomyopathy with an EF in the 35% range despite being on guideline directed optimal medical therapy.  He is asymptomatic.  He did have a BiV ICD placed which was explanted because of infection followed by a subcutaneous ICD placed by [name] which was also explanted because of infection by Dr. Lalla Brothers.

## 2023-02-12 NOTE — Assessment & Plan Note (Signed)
History of peripheral arterial disease with known renal artery stenosis and iliac stenosis by cath in the past.  He is ABIs are normal and he really denies lifestyle-limiting claudication.  His renal Dopplers have remained normal as well.

## 2023-02-12 NOTE — Assessment & Plan Note (Signed)
History of renal artery stenosis followed by duplex ultrasound 

## 2023-02-12 NOTE — Patient Instructions (Signed)
Medication Instructions:  Your physician recommends that you continue on your current medications as directed. Please refer to the Current Medication list given to you today.  *If you need a refill on your cardiac medications before your next appointment, please call your pharmacy*   Testing/Procedures: Your physician has requested that you have an echocardiogram. Echocardiography is a painless test that uses sound waves to create images of your heart. It provides your doctor with information about the size and shape of your heart and how well your heart's chambers and valves are working. This procedure takes approximately one hour. There are no restrictions for this procedure. Please do NOT wear cologne, perfume, aftershave, or lotions (deodorant is allowed). Please arrive 15 minutes prior to your appointment time. This will take place at [address]  **To be done in November**    Follow-Up: At [facility], you and your health needs are our priority.  As part of our continuing mission to provide you with exceptional heart care, we have created designated Provider Care Teams.  These Care Teams include your primary Cardiologist (physician) and Advanced Practice Providers (APPs -  Physician Assistants and Nurse Practitioners) who all work together to provide you with the care you need, when you need it.  We recommend signing up for the patient portal called "MyChart".  Sign up information is provided on this After Visit Summary.  MyChart is used to connect with patients for Virtual Visits (Telemedicine).  Patients are able to view lab/test results, encounter notes, upcoming appointments, etc.  Non-urgent messages can be sent to your provider as well.   To learn more about what you can do with MyChart, go to [email or web address].    Your next appointment:   6 month(s)  Provider:   Marjie Skiff, PA-C, Juanda Crumble, PA-C, Azalee Course, PA-C, Bernadene Person, NP, or  Carlos Levering, NP      Then, Nanetta Batty, MD will plan to see you again in 12 month(s).

## 2023-02-12 NOTE — Assessment & Plan Note (Signed)
Chronic. 

## 2023-03-07 ENCOUNTER — Other Ambulatory Visit: Payer: Self-pay | Admitting: Cardiovascular Disease

## 2023-04-09 ENCOUNTER — Ambulatory Visit (HOSPITAL_COMMUNITY)
Admission: RE | Admit: 2023-04-09 | Discharge: 2023-04-09 | Disposition: A | Discharge status: Home / Self Care | Payer: Medicare HMO | Source: Ambulatory Visit | Attending: Cardiology | Admitting: Cardiology

## 2023-04-09 ENCOUNTER — Ambulatory Visit (HOSPITAL_BASED_OUTPATIENT_CLINIC_OR_DEPARTMENT_OTHER)
Admission: RE | Admit: 2023-04-09 | Discharge: 2023-04-09 | Disposition: A | Discharge status: Home / Self Care | Payer: Medicare HMO | Source: Ambulatory Visit | Attending: Cardiovascular Disease | Admitting: Cardiovascular Disease

## 2023-04-09 DIAGNOSIS — I701 Atherosclerosis of renal artery: Secondary | ICD-10-CM | POA: Diagnosis not present

## 2023-04-09 DIAGNOSIS — I6523 Occlusion and stenosis of bilateral carotid arteries: Secondary | ICD-10-CM | POA: Insufficient documentation

## 2023-05-14 ENCOUNTER — Other Ambulatory Visit: Payer: Self-pay | Admitting: Cardiovascular Disease

## 2023-05-20 ENCOUNTER — Ambulatory Visit (INDEPENDENT_AMBULATORY_CARE_PROVIDER_SITE_OTHER): Payer: Medicare HMO | Admitting: Family Medicine

## 2023-05-20 ENCOUNTER — Encounter: Payer: Self-pay | Admitting: Family Medicine

## 2023-05-20 VITALS — BP 143/64 | HR 52 | Temp 97.3°F | Resp 20 | Ht 70.0 in | Wt 153.0 lb

## 2023-05-20 DIAGNOSIS — I2583 Coronary atherosclerosis due to lipid rich plaque: Secondary | ICD-10-CM | POA: Diagnosis not present

## 2023-05-20 DIAGNOSIS — F17210 Nicotine dependence, cigarettes, uncomplicated: Secondary | ICD-10-CM | POA: Diagnosis not present

## 2023-05-20 DIAGNOSIS — K219 Gastro-esophageal reflux disease without esophagitis: Secondary | ICD-10-CM

## 2023-05-20 DIAGNOSIS — I10 Essential (primary) hypertension: Secondary | ICD-10-CM

## 2023-05-20 DIAGNOSIS — I2510 Atherosclerotic heart disease of native coronary artery without angina pectoris: Secondary | ICD-10-CM

## 2023-05-20 DIAGNOSIS — E782 Mixed hyperlipidemia: Secondary | ICD-10-CM | POA: Diagnosis not present

## 2023-05-20 DIAGNOSIS — R739 Hyperglycemia, unspecified: Secondary | ICD-10-CM | POA: Diagnosis not present

## 2023-05-20 LAB — CBC WITH DIFFERENTIAL/PLATELET
Basophils Absolute: 0.1 10*3/uL (ref 0.0–0.2)
Basos: 1 %
EOS (ABSOLUTE): 1 10*3/uL — ABNORMAL HIGH (ref 0.0–0.4)
Eos: 11 %
Hematocrit: 49.4 % (ref 37.5–51.0)
Hemoglobin: 16.5 g/dL (ref 13.0–17.7)
Immature Grans (Abs): 0 10*3/uL (ref 0.0–0.1)
Immature Granulocytes: 0 %
Lymphocytes Absolute: 1.7 10*3/uL (ref 0.7–3.1)
Lymphs: 18 %
MCH: 29.4 pg (ref 26.6–33.0)
MCHC: 33.4 g/dL (ref 31.5–35.7)
MCV: 88 fL (ref 79–97)
Monocytes Absolute: 0.6 10*3/uL (ref 0.1–0.9)
Monocytes: 6 %
Neutrophils Absolute: 6 10*3/uL (ref 1.4–7.0)
Neutrophils: 64 %
Platelets: 308 10*3/uL (ref 150–450)
RBC: 5.62 x10E6/uL (ref 4.14–5.80)
RDW: 12.8 % (ref 11.6–15.4)
WBC: 9.3 10*3/uL (ref 3.4–10.8)

## 2023-05-20 LAB — BAYER DCA HB A1C WAIVED: HB A1C (BAYER DCA - WAIVED): 5.3 % (ref 4.8–5.6)

## 2023-05-20 LAB — CMP14+EGFR

## 2023-05-20 LAB — LIPID PANEL

## 2023-05-20 MED ORDER — PANTOPRAZOLE SODIUM 40 MG PO TBEC
40.0000 mg | DELAYED_RELEASE_TABLET | Freq: Every day | ORAL | 3 refills | Status: DC
Start: 2023-05-20 — End: 2024-05-06

## 2023-05-20 MED ORDER — FENOFIBRATE 145 MG PO TABS: 145.0000 mg | ORAL_TABLET | Freq: Every day | ORAL | 3 refills | Status: DC

## 2023-05-20 MED ORDER — EZETIMIBE 10 MG PO TABS: 10.0000 mg | ORAL_TABLET | Freq: Every day | ORAL | 3 refills | Status: DC

## 2023-05-20 MED ORDER — ROSUVASTATIN CALCIUM 40 MG PO TABS: 40.0000 mg | ORAL_TABLET | Freq: Every day | ORAL | 3 refills | Status: DC

## 2023-05-20 NOTE — Progress Notes (Signed)
BP (!) 143/64   Pulse (!) 52   Temp (!) 97.3 F (36.3 C) (Temporal)   Resp 20   Ht 5\' 10"  (1.778 m)   Wt 153 lb (69.4 kg)   SpO2 98%   BMI 21.95 kg/m    Subjective:   Patient ID: Jonathon Ryan, male    DOB: [birth year], 67 y.o.   MRN: [id number]  HPI: Jonathon Ryan is a 67 y.o. male presenting on 05/20/2023 for Medical Management of Chronic Issues   HPI Hypertension Patient is currently on carvedilol and [place] and Entresto, and their blood pressure today is 143/64. Patient denies any lightheadedness or dizziness. Patient denies headaches, blurred vision, chest pains, shortness of breath, or weakness. Denies any side effects from medication and is content with current medication.   Hyperlipidemia and CAD recheck Patient is coming in for recheck of his hyperlipidemia. The patient is currently taking Zetia and fenofibrate and Crestor. They deny any issues with myalgias or history of liver damage from it. They deny any focal numbness or weakness or chest pain.  Patient has a history of ischemic cardiomyopathy and left bundle branch block and stent for carotid artery disease.  He has a pacer defibrillator in place.  He sees cardiology.  GERD Patient is currently on omeprazole, will switch to pantoprazole.  She denies any major symptoms or abdominal pain or belching or burping. She denies any blood in her stool or lightheadedness or dizziness.   Relevant past medical, surgical, family and social history reviewed and updated as indicated. Interim medical history since our last visit reviewed. Allergies and medications reviewed and updated.  Review of Systems  Constitutional:  Negative for chills and fever.  Respiratory:  Negative for shortness of breath and wheezing.   Cardiovascular:  Negative for chest pain and leg swelling.  Musculoskeletal:  Negative for back pain and gait problem.  Skin:  Negative for rash.  Neurological:  Negative for dizziness, weakness and numbness.  All  other systems reviewed and are negative.   Per HPI unless specifically indicated above   Allergies as of 05/20/2023   No Known Allergies      Medication List        Accurate as of May 20, 2023  8:42 AM. If you have any questions, ask your nurse or doctor.          STOP taking these medications    omeprazole 40 MG capsule Commonly known as: PRILOSEC Stopped by: Elige Radon Nilaya Bouie       TAKE these medications    aspirin 81 MG tablet Take 1 tablet (81 mg total) by mouth daily.   carvedilol 25 MG tablet Commonly known as: COREG TAKE 1 TABLET BY MOUTH TWICE DAILY WITH A MEAL   clopidogrel 75 MG tablet Commonly known as: PLAVIX TAKE 1 TABLET BY MOUTH ONCE DAILY . APPOINTMENT REQUIRED FOR FUTURE REFILLS   Entresto 24-26 MG Generic drug: sacubitril-valsartan Take 1 tablet by mouth twice daily   ezetimibe 10 MG tablet Commonly known as: ZETIA Take 1 tablet (10 mg total) by mouth daily.   Farxiga 10 MG Tabs tablet Generic drug: dapagliflozin propanediol TAKE 1 TABLET BY MOUTH ONCE DAILY BEFORE BREAKFAST   fenofibrate 145 MG tablet Commonly known as: TRICOR Take 1 tablet (145 mg total) by mouth daily.   pantoprazole 40 MG tablet Commonly known as: PROTONIX Take 1 tablet (40 mg total) by mouth daily. Started by: Elige Radon Bach Rocchi   rosuvastatin 40 MG tablet Commonly  known as: CRESTOR Take 1 tablet (40 mg total) by mouth daily.         Objective:   BP (!) 143/64   Pulse (!) 52   Temp (!) 97.3 F (36.3 C) (Temporal)   Resp 20   Ht 5\' 10"  (1.778 m)   Wt 153 lb (69.4 kg)   SpO2 98%   BMI 21.95 kg/m   Wt Readings from Last 3 Encounters:  05/20/23 153 lb (69.4 kg)  02/12/23 157 lb 12.8 oz (71.6 kg)  11/19/22 159 lb (72.1 kg)    Physical Exam Vitals and nursing note reviewed.  Constitutional:      General: He is not in acute distress.    Appearance: He is well-developed. He is not diaphoretic.  Eyes:     General: No scleral icterus.     Conjunctiva/sclera: Conjunctivae normal.  Neck:     Thyroid: No thyromegaly.  Cardiovascular:     Rate and Rhythm: Normal rate and regular rhythm.     Heart sounds: Normal heart sounds. No murmur heard. Pulmonary:     Effort: Pulmonary effort is normal. No respiratory distress.     Breath sounds: Normal breath sounds. No wheezing.  Musculoskeletal:        General: No swelling. Normal range of motion.     Cervical back: Neck supple.  Lymphadenopathy:     Cervical: No cervical adenopathy.  Skin:    General: Skin is warm and dry.     Findings: No rash.  Neurological:     Mental Status: He is alert and oriented to person, place, and time.     Coordination: Coordination normal.  Psychiatric:        Behavior: Behavior normal.       Assessment & Plan:   Problem List Items Addressed This Visit       Cardiovascular and Mediastinum   Hypertension - Primary   Relevant Medications   ezetimibe (ZETIA) 10 MG tablet   fenofibrate (TRICOR) 145 MG tablet   rosuvastatin (CRESTOR) 40 MG tablet   Other Relevant Orders   CBC with Differential/Platelet   CMP14+EGFR   CAD, 90% LM, Total RCA, 80-90% CFX- S/P urgent CABG X 11 Jun 2012   Relevant Medications   ezetimibe (ZETIA) 10 MG tablet   fenofibrate (TRICOR) 145 MG tablet   rosuvastatin (CRESTOR) 40 MG tablet     Digestive   GERD (gastroesophageal reflux disease)   Relevant Medications   pantoprazole (PROTONIX) 40 MG tablet     Other   HLD (hyperlipidemia)   Relevant Medications   ezetimibe (ZETIA) 10 MG tablet   fenofibrate (TRICOR) 145 MG tablet   rosuvastatin (CRESTOR) 40 MG tablet   Other Relevant Orders   Lipid panel   Other Visit Diagnoses     Elevated blood sugar       Relevant Orders   Bayer DCA Hb A1c Waived   Smokes less than 2 packs a day with greater than 20 pack year history       Relevant Orders   Ambulatory Referral for Lung Cancer Scre       Time spent in chronic disease management including  hypertension and hyperlipidemia and CAD discussion with the patient  Switch to pantoprazole because of possible interaction between omeprazole and Plavix.  Ordered tests for lung cancer screening, patient declines smoking cessation today Follow up plan: Return in about 6 months (around 11/20/2023), or if symptoms worsen or fail to improve, for Hypertension and cholesterol and GERD.  Counseling provided for all of the vaccine components Orders Placed This Encounter  Procedures   Bayer DCA Hb A1c Waived   CBC with Differential/Platelet   CMP14+EGFR   Lipid panel   Ambulatory Referral for Lung Cancer Scre    Arville Care, MD [facility] 05/20/2023, 8:42 AM

## 2023-06-21 ENCOUNTER — Other Ambulatory Visit: Payer: Self-pay | Admitting: Cardiovascular Disease

## 2023-08-21 ENCOUNTER — Other Ambulatory Visit: Payer: Self-pay

## 2023-08-21 DIAGNOSIS — Z87891 Personal history of nicotine dependence: Secondary | ICD-10-CM

## 2023-08-21 DIAGNOSIS — F17210 Nicotine dependence, cigarettes, uncomplicated: Secondary | ICD-10-CM

## 2023-08-21 DIAGNOSIS — Z122 Encounter for screening for malignant neoplasm of respiratory organs: Secondary | ICD-10-CM

## 2023-08-26 ENCOUNTER — Encounter: Payer: Self-pay | Admitting: Adult Health

## 2023-08-26 ENCOUNTER — Ambulatory Visit (INDEPENDENT_AMBULATORY_CARE_PROVIDER_SITE_OTHER): Payer: Medicare HMO | Admitting: Adult Health

## 2023-08-26 DIAGNOSIS — F17210 Nicotine dependence, cigarettes, uncomplicated: Secondary | ICD-10-CM | POA: Diagnosis not present

## 2023-08-26 NOTE — Progress Notes (Signed)
  Virtual Visit via Telephone Note  I connected with Jonathon Ryan , 08/26/23 8:43 AM by a telemedicine application and verified that I am speaking with the correct person using two identifiers.  Location: Patient: home Provider: home   I discussed the limitations of evaluation and management by telemedicine and the availability of in person appointments. The patient expressed understanding and agreed to proceed.   Shared Decision Making Visit Lung Cancer Screening Program [phone])   Eligibility: 67 y.o. Pack Years Smoking History Calculation = 45 pack years (# packs/per year x # years smoked) Recent History of coughing up blood  no Unexplained weight loss? no ( >Than 15 pounds within the last 6 months ) Prior History Lung / other cancer no (Diagnosis within the last 5 years already requiring surveillance chest CT Scans). Smoking Status Current Smoker  Visit Components: Discussion included one or more decision making aids. YES Discussion included risk/benefits of screening. YES Discussion included potential follow up diagnostic testing for abnormal scans. YES Discussion included meaning and risk of over diagnosis. YES Discussion included meaning and risk of False Positives. YES Discussion included meaning of total radiation exposure. YES  Counseling Included: Importance of adherence to annual lung cancer LDCT screening. YES Impact of comorbidities on ability to participate in the program. YES Ability and willingness to under diagnostic treatment. YES  Smoking Cessation Counseling: Current Smokers:  Discussed importance of smoking cessation. yes Information about tobacco cessation classes and interventions provided to patient. yes Patient provided with "ticket" for LDCT Scan. yes Diagnosis Code: Tobacco Use Z72.0 Asymptomatic Patient yes  Counseling (Intermediate counseling: > three minutes counseling) [id number] Written Order for Lung Cancer Screening with LDCT placed in  Epic. Yes (CT Chest Lung Cancer Screening Low Dose W/O CM) [id number]  Z12.2-Screening of respiratory organs Z87.891-Personal history of nicotine dependence   Danford Bad 08/26/23

## 2023-08-26 NOTE — Patient Instructions (Signed)

## 2023-09-03 ENCOUNTER — Ambulatory Visit (HOSPITAL_BASED_OUTPATIENT_CLINIC_OR_DEPARTMENT_OTHER)
Admission: RE | Admit: 2023-09-03 | Discharge: 2023-09-03 | Disposition: A | Discharge status: Home / Self Care | Payer: Medicare HMO | Source: Ambulatory Visit | Attending: Cardiovascular Disease | Admitting: Cardiovascular Disease

## 2023-09-03 ENCOUNTER — Ambulatory Visit (HOSPITAL_BASED_OUTPATIENT_CLINIC_OR_DEPARTMENT_OTHER): Payer: Medicare HMO

## 2023-09-03 DIAGNOSIS — I739 Peripheral vascular disease, unspecified: Secondary | ICD-10-CM | POA: Insufficient documentation

## 2023-09-03 DIAGNOSIS — Z87891 Personal history of nicotine dependence: Secondary | ICD-10-CM | POA: Diagnosis not present

## 2023-09-03 DIAGNOSIS — F17210 Nicotine dependence, cigarettes, uncomplicated: Secondary | ICD-10-CM | POA: Insufficient documentation

## 2023-09-03 DIAGNOSIS — E782 Mixed hyperlipidemia: Secondary | ICD-10-CM

## 2023-09-03 DIAGNOSIS — I255 Ischemic cardiomyopathy: Secondary | ICD-10-CM | POA: Diagnosis not present

## 2023-09-03 DIAGNOSIS — Z720 Tobacco use: Secondary | ICD-10-CM

## 2023-09-03 DIAGNOSIS — Z8674 Personal history of sudden cardiac arrest: Secondary | ICD-10-CM | POA: Diagnosis not present

## 2023-09-03 DIAGNOSIS — Z122 Encounter for screening for malignant neoplasm of respiratory organs: Secondary | ICD-10-CM | POA: Insufficient documentation

## 2023-09-03 LAB — ECHOCARDIOGRAM COMPLETE
Area-P 1/2: 3.06 cm2
S' Lateral: 5 cm

## 2023-09-11 ENCOUNTER — Other Ambulatory Visit (HOSPITAL_COMMUNITY): Payer: Self-pay

## 2023-09-11 DIAGNOSIS — I10 Essential (primary) hypertension: Secondary | ICD-10-CM

## 2023-09-11 DIAGNOSIS — E782 Mixed hyperlipidemia: Secondary | ICD-10-CM

## 2023-09-11 DIAGNOSIS — I255 Ischemic cardiomyopathy: Secondary | ICD-10-CM

## 2023-09-17 ENCOUNTER — Other Ambulatory Visit: Payer: Self-pay | Admitting: Cardiovascular Disease

## 2023-09-24 ENCOUNTER — Telehealth: Payer: Self-pay | Admitting: Acute Care

## 2023-09-24 NOTE — Telephone Encounter (Signed)
Key in impression number 2. CT from 09/19/23

## 2023-10-10 ENCOUNTER — Other Ambulatory Visit: Payer: Self-pay

## 2023-10-10 ENCOUNTER — Telehealth: Payer: Self-pay | Admitting: Family Medicine

## 2023-10-10 DIAGNOSIS — Z87891 Personal history of nicotine dependence: Secondary | ICD-10-CM

## 2023-10-10 DIAGNOSIS — R599 Enlarged lymph nodes, unspecified: Secondary | ICD-10-CM

## 2023-10-10 DIAGNOSIS — F17210 Nicotine dependence, cigarettes, uncomplicated: Secondary | ICD-10-CM

## 2023-10-10 DIAGNOSIS — Z122 Encounter for screening for malignant neoplasm of respiratory organs: Secondary | ICD-10-CM

## 2023-10-10 NOTE — Telephone Encounter (Signed)
 Called and spoke with Archie Patten and Dr. Darrol Poke office. She will forward message to MD to review CT results and order abd/pelvis CT and f/u as needed. LCS annual CT order placed. Results and plan also faxed to Dr. Darrol Poke office.

## 2023-10-10 NOTE — Telephone Encounter (Signed)
-----   Message from Fonda LABOR Dettinger sent at 10/10/2023  3:28 PM EST ----- Patient's lung CT scan showed that the lungs looked good but that there were some areas near the liver the were patchy and they could not visualize well and they want us  to do a CT abdomen and pelvis with oral and IV contrast, can we please try to contact the patient and notify him of this so that we can get this scheduled.  Diagnosis retroperitoneal adenopathy Fonda Levins, MD [facility] 10/10/2023, 3:29 PM ----- Message ----- From: Belvie Isaiah HERO, RN Sent: 10/10/2023   3:14 PM EST To: Fonda LABOR Levins, MD  Hi Dr. Levins. Noted on LCS CT was apparent abnormal patchy density in the gastrohepatic ligament and porta hepatis regions, poorly evaluated on this noncontrast study, cannot exclude upper retroperitoneal adenopathy. Recommend further evaluation with CT abdomen/pelvis with oral and IV contrast at this time. Can you order abdominal CT and arrange additional follow up if needed. We have attempted to reach patient to review results and advise him to f/u with PCP for abdominal CT and to schedule further follow up if needed. We will continue to try and reach him to review results. LCS will be completed annually as planned.

## 2023-10-10 NOTE — Telephone Encounter (Signed)
 Left message for pt to return call.

## 2023-10-10 NOTE — Telephone Encounter (Signed)
 Patient had LDCT on 09/03/2023 that resulted as a LR2. Patient can have annual LDCT as lung nodules are stable, LDCT due 09/03/2024. However, there is a notation of retroperitoneal adenopathy with recommendations to have a abd/pelvis CT with oral and IV contrast. Per Lauraine Lites NP, will need to call patient then PCP and discuss these findings and the recommendations for CT. PCP will need to be the ordering provider of CT abd/pelvis for further eval.     IMPRESSION: 1. Lung-RADS 2-S, benign appearance or behavior. Continue annual screening with low-dose chest CT without contrast in 12 months. 2. The S modifier above refers to potentially clinically significant non lung cancer related findings. Specifically, Apparent abnormal patchy density in the gastrohepatic ligament and porta hepatis regions, poorly evaluated on this noncontrast study, cannot exclude upper retroperitoneal adenopathy. Recommend further evaluation with CT abdomen/pelvis with oral and IV contrast at this time. 3. Aortic Atherosclerosis (ICD10-I70.0) and Emphysema (ICD10-J43.9).   These results will be called to the ordering clinician or representative by the Radiologist Assistant, and communication documented in the PACS or [employer].     Electronically Signed   By: Selinda DELENA Blue M.D.   On: 09/24/2023 09:32

## 2023-10-10 NOTE — Telephone Encounter (Signed)
 LVM to call office and review results.

## 2023-10-10 NOTE — Telephone Encounter (Signed)
 Copied from CRM [id number]. Topic: Clinical - Lab/Test Results >> Oct 10, 2023  3:19 PM Bascom RAMAN wrote: Reason for CRM: Isaiah from [facility] requests that Dr Dettinger please look at Lung CT that was forwarded to you to show that patient needs an abdominal CT. Callback [phone].

## 2023-10-11 NOTE — Addendum Note (Signed)
 Addended by: Austin Miles F on: 10/11/2023 10:16 AM   Modules accepted: Orders

## 2023-10-11 NOTE — Telephone Encounter (Signed)
 Patient aware and verbalized understanding. Ct scan ordered

## 2023-11-04 ENCOUNTER — Ambulatory Visit (HOSPITAL_COMMUNITY)
Admission: RE | Admit: 2023-11-04 | Discharge: 2023-11-04 | Disposition: A | Discharge status: Home / Self Care | Payer: Medicare HMO | Source: Ambulatory Visit | Attending: Family Medicine | Admitting: Family Medicine

## 2023-11-04 ENCOUNTER — Encounter (HOSPITAL_COMMUNITY): Payer: Self-pay | Admitting: Radiology

## 2023-11-04 DIAGNOSIS — K5730 Diverticulosis of large intestine without perforation or abscess without bleeding: Secondary | ICD-10-CM | POA: Diagnosis not present

## 2023-11-04 DIAGNOSIS — K4090 Unilateral inguinal hernia, without obstruction or gangrene, not specified as recurrent: Secondary | ICD-10-CM | POA: Diagnosis not present

## 2023-11-04 DIAGNOSIS — K760 Fatty (change of) liver, not elsewhere classified: Secondary | ICD-10-CM | POA: Diagnosis not present

## 2023-11-04 DIAGNOSIS — R599 Enlarged lymph nodes, unspecified: Secondary | ICD-10-CM | POA: Insufficient documentation

## 2023-11-04 MED ORDER — IOHEXOL 300 MG/ML  SOLN
100.0000 mL | Freq: Once | INTRAMUSCULAR | Status: AC | PRN
  Administered 2023-11-04: 100 mL via INTRAVENOUS

## 2023-11-04 MED ORDER — IOHEXOL 9 MG/ML PO SOLN
ORAL | Status: AC
  Filled 2023-11-04: qty 500

## 2023-11-11 ENCOUNTER — Other Ambulatory Visit: Payer: Self-pay | Admitting: Family Medicine

## 2023-11-11 ENCOUNTER — Encounter: Payer: Self-pay | Admitting: Family Medicine

## 2023-11-11 DIAGNOSIS — K869 Disease of pancreas, unspecified: Secondary | ICD-10-CM

## 2023-11-11 DIAGNOSIS — K769 Liver disease, unspecified: Secondary | ICD-10-CM

## 2023-11-11 DIAGNOSIS — R1900 Intra-abdominal and pelvic swelling, mass and lump, unspecified site: Secondary | ICD-10-CM

## 2023-11-19 ENCOUNTER — Other Ambulatory Visit: Payer: Self-pay

## 2023-11-19 ENCOUNTER — Other Ambulatory Visit: Payer: Medicare HMO

## 2023-11-19 DIAGNOSIS — R739 Hyperglycemia, unspecified: Secondary | ICD-10-CM | POA: Diagnosis not present

## 2023-11-19 DIAGNOSIS — I10 Essential (primary) hypertension: Secondary | ICD-10-CM | POA: Diagnosis not present

## 2023-11-19 DIAGNOSIS — E782 Mixed hyperlipidemia: Secondary | ICD-10-CM

## 2023-11-19 LAB — LIPID PANEL

## 2023-11-19 LAB — BAYER DCA HB A1C WAIVED: HB A1C (BAYER DCA - WAIVED): 5.1 % (ref 4.8–5.6)

## 2023-11-20 ENCOUNTER — Ambulatory Visit (HOSPITAL_COMMUNITY)
Admission: RE | Admit: 2023-11-20 | Discharge: 2023-11-20 | Disposition: A | Discharge status: Home / Self Care | Payer: Medicare HMO | Source: Ambulatory Visit | Attending: Family Medicine | Admitting: Family Medicine

## 2023-11-20 ENCOUNTER — Ambulatory Visit (INDEPENDENT_AMBULATORY_CARE_PROVIDER_SITE_OTHER): Payer: Medicare HMO | Admitting: Family Medicine

## 2023-11-20 ENCOUNTER — Encounter: Payer: Self-pay | Admitting: Family Medicine

## 2023-11-20 VITALS — BP 124/67 | HR 54 | Ht 70.0 in | Wt 149.0 lb

## 2023-11-20 DIAGNOSIS — E782 Mixed hyperlipidemia: Secondary | ICD-10-CM | POA: Diagnosis not present

## 2023-11-20 DIAGNOSIS — K769 Liver disease, unspecified: Secondary | ICD-10-CM | POA: Diagnosis not present

## 2023-11-20 DIAGNOSIS — R932 Abnormal findings on diagnostic imaging of liver and biliary tract: Secondary | ICD-10-CM | POA: Diagnosis not present

## 2023-11-20 DIAGNOSIS — K869 Disease of pancreas, unspecified: Secondary | ICD-10-CM | POA: Insufficient documentation

## 2023-11-20 DIAGNOSIS — I10 Essential (primary) hypertension: Secondary | ICD-10-CM

## 2023-11-20 DIAGNOSIS — I700 Atherosclerosis of aorta: Secondary | ICD-10-CM | POA: Diagnosis not present

## 2023-11-20 DIAGNOSIS — I2510 Atherosclerotic heart disease of native coronary artery without angina pectoris: Secondary | ICD-10-CM | POA: Diagnosis not present

## 2023-11-20 DIAGNOSIS — R1900 Intra-abdominal and pelvic swelling, mass and lump, unspecified site: Secondary | ICD-10-CM | POA: Insufficient documentation

## 2023-11-20 DIAGNOSIS — I2583 Coronary atherosclerosis due to lipid rich plaque: Secondary | ICD-10-CM | POA: Diagnosis not present

## 2023-11-20 DIAGNOSIS — Z125 Encounter for screening for malignant neoplasm of prostate: Secondary | ICD-10-CM | POA: Diagnosis not present

## 2023-11-20 DIAGNOSIS — R188 Other ascites: Secondary | ICD-10-CM | POA: Diagnosis not present

## 2023-11-20 LAB — CBC WITH DIFFERENTIAL/PLATELET
Basophils Absolute: 0.1 10*3/uL (ref 0.0–0.2)
Basos: 1 %
EOS (ABSOLUTE): 0.6 10*3/uL — ABNORMAL HIGH (ref 0.0–0.4)
Eos: 8 %
Hematocrit: 43.3 % (ref 37.5–51.0)
Hemoglobin: 14.5 g/dL (ref 13.0–17.7)
Immature Grans (Abs): 0 10*3/uL (ref 0.0–0.1)
Immature Granulocytes: 0 %
Lymphocytes Absolute: 1.5 10*3/uL (ref 0.7–3.1)
Lymphs: 20 %
MCH: 30.1 pg (ref 26.6–33.0)
MCHC: 33.5 g/dL (ref 31.5–35.7)
MCV: 90 fL (ref 79–97)
Monocytes Absolute: 0.5 10*3/uL (ref 0.1–0.9)
Monocytes: 6 %
Neutrophils Absolute: 5 10*3/uL (ref 1.4–7.0)
Neutrophils: 65 %
Platelets: 303 10*3/uL (ref 150–450)
RBC: 4.82 x10E6/uL (ref 4.14–5.80)
RDW: 12.7 % (ref 11.6–15.4)
WBC: 7.6 10*3/uL (ref 3.4–10.8)

## 2023-11-20 LAB — CMP14+EGFR
ALT: 13 [IU]/L (ref 0–44)
AST: 20 [IU]/L (ref 0–40)
Albumin: 4.5 g/dL (ref 3.9–4.9)
Alkaline Phosphatase: 59 [IU]/L (ref 44–121)
BUN/Creatinine Ratio: 11 (ref 10–24)
BUN: 13 mg/dL (ref 8–27)
Bilirubin Total: 0.5 mg/dL (ref 0.0–1.2)
CO2: 21 mmol/L (ref 20–29)
Calcium: 10 mg/dL (ref 8.6–10.2)
Chloride: 103 mmol/L (ref 96–106)
Creatinine, Ser: 1.14 mg/dL (ref 0.76–1.27)
Globulin, Total: 2.8 g/dL (ref 1.5–4.5)
Glucose: 98 mg/dL (ref 70–99)
Potassium: 5.3 mmol/L — ABNORMAL HIGH (ref 3.5–5.2)
Sodium: 140 mmol/L (ref 134–144)
Total Protein: 7.3 g/dL (ref 6.0–8.5)
eGFR: 70 mL/min/{1.73_m2} (ref >59)

## 2023-11-20 LAB — LIPID PANEL
Cholesterol, Total: 118 mg/dL (ref 100–199)
HDL: 26 mg/dL — ABNORMAL LOW (ref >39)
LDL CALC COMMENT:: 4.5 ratio (ref 0.0–5.0)
LDL Chol Calc (NIH): 62 mg/dL (ref 0–99)
Triglycerides: 179 mg/dL — ABNORMAL HIGH (ref 0–149)
VLDL Cholesterol Cal: 30 mg/dL (ref 5–40)

## 2023-11-20 MED ORDER — GADOBUTROL 1 MMOL/ML IV SOLN
7.0000 mL | Freq: Once | INTRAVENOUS | Status: AC | PRN
  Administered 2023-11-20: 7 mL via INTRAVENOUS

## 2023-11-20 NOTE — Progress Notes (Signed)
BP 124/67   Pulse (!) 54   Ht 5\' 10"  (1.778 m)   Wt 149 lb (67.6 kg)   SpO2 100%   BMI 21.38 kg/m    Subjective:   Patient ID: Jonathon Ryan, male    DOB: [birth year], 68 y.o.   MRN: [id number]  HPI: Jonathon Ryan is a 68 y.o. male presenting on 11/20/2023 for Medical Management of Chronic Issues, Hyperlipidemia, and Hypertension   HPI Hypertension Patient is currently on carvedilol and Entresto and Farxiga, and their blood pressure today is 124/67. Patient denies any lightheadedness or dizziness. Patient denies headaches, blurred vision, chest pains, shortness of breath, or weakness. Denies any side effects from medication and is content with current medication.   Hyperlipidemia and CAD recheck, also sees cardiology Patient is coming in for recheck of his hyperlipidemia. The patient is currently taking clopidogrel and Zetia and fenofibrate and Crestor. They deny any issues with myalgias or history of liver damage from it. They deny any focal numbness or weakness or chest pain.   Relevant past medical, surgical, family and social history reviewed and updated as indicated. Interim medical history since our last visit reviewed. Allergies and medications reviewed and updated.  Review of Systems  Constitutional:  Negative for chills and fever.  Eyes:  Negative for visual disturbance.  Respiratory:  Negative for shortness of breath and wheezing.   Cardiovascular:  Negative for chest pain and leg swelling.  Musculoskeletal:  Negative for back pain and gait problem.  Skin:  Negative for rash.  Neurological:  Negative for dizziness, weakness and light-headedness.  All other systems reviewed and are negative.   Per HPI unless specifically indicated above   Allergies as of 11/20/2023   No Known Allergies      Medication List        Accurate as of November 20, 2023  8:22 AM. If you have any questions, ask your nurse or doctor.          aspirin 81 MG tablet Take 1 tablet (81  mg total) by mouth daily.   carvedilol 25 MG tablet Commonly known as: COREG TAKE 1 TABLET BY MOUTH TWICE DAILY WITH A MEAL   clopidogrel 75 MG tablet Commonly known as: PLAVIX Take 1 tablet by mouth once daily   Entresto 24-26 MG Generic drug: sacubitril-valsartan Take 1 tablet by mouth twice daily   ezetimibe 10 MG tablet Commonly known as: ZETIA Take 1 tablet (10 mg total) by mouth daily.   Farxiga 10 MG Tabs tablet Generic drug: dapagliflozin propanediol TAKE 1 TABLET BY MOUTH ONCE DAILY BEFORE BREAKFAST   fenofibrate 145 MG tablet Commonly known as: TRICOR Take 1 tablet (145 mg total) by mouth daily.   pantoprazole 40 MG tablet Commonly known as: PROTONIX Take 1 tablet (40 mg total) by mouth daily.   rosuvastatin 40 MG tablet Commonly known as: CRESTOR Take 1 tablet (40 mg total) by mouth daily.         Objective:   BP 124/67   Pulse (!) 54   Ht 5\' 10"  (1.778 m)   Wt 149 lb (67.6 kg)   SpO2 100%   BMI 21.38 kg/m   Wt Readings from Last 3 Encounters:  11/20/23 149 lb (67.6 kg)  05/20/23 153 lb (69.4 kg)  02/12/23 157 lb 12.8 oz (71.6 kg)    Physical Exam Vitals and nursing note reviewed.  Constitutional:      General: He is not in acute distress.  Appearance: He is well-developed. He is not diaphoretic.  Eyes:     General: No scleral icterus.    Conjunctiva/sclera: Conjunctivae normal.  Neck:     Thyroid: No thyromegaly.  Cardiovascular:     Rate and Rhythm: Normal rate and regular rhythm.     Heart sounds: Normal heart sounds. No murmur heard. Pulmonary:     Effort: Pulmonary effort is normal. No respiratory distress.     Breath sounds: Normal breath sounds. No wheezing.  Musculoskeletal:        General: Normal range of motion.     Cervical back: Neck supple.  Lymphadenopathy:     Cervical: No cervical adenopathy.  Skin:    General: Skin is warm and dry.     Findings: No rash.  Neurological:     Mental Status: He is alert and  oriented to person, place, and time.     Coordination: Coordination normal.  Psychiatric:        Behavior: Behavior normal.     Results for orders placed or performed in visit on 11/19/23  Bayer DCA Hb A1c Waived   Collection Time: 11/19/23  9:45 AM  Result Value Ref Range   HB A1C (BAYER DCA - WAIVED) 5.1 4.8 - 5.6 %  Lipid panel   Collection Time: 11/19/23  9:46 AM  Result Value Ref Range   Cholesterol, Total 118 100 - 199 mg/dL   Triglycerides 161 (H) 0 - 149 mg/dL   HDL 26 (L) >09 mg/dL   VLDL Cholesterol Cal 30 5 - 40 mg/dL   LDL Chol Calc (NIH) 62 0 - 99 mg/dL   Chol/HDL Ratio 4.5 0.0 - 5.0 ratio  CBC with Differential/Platelet   Collection Time: 11/19/23  9:46 AM  Result Value Ref Range   WBC 7.6 3.4 - 10.8 x10E3/uL   RBC 4.82 4.14 - 5.80 x10E6/uL   Hemoglobin 14.5 13.0 - 17.7 g/dL   Hematocrit 60.4 54.0 - 51.0 %   MCV 90 79 - 97 fL   MCH 30.1 26.6 - 33.0 pg   MCHC 33.5 31.5 - 35.7 g/dL   RDW 98.1 19.1 - 47.8 %   Platelets 303 150 - 450 x10E3/uL   Neutrophils 65 Not Estab. %   Lymphs 20 Not Estab. %   Monocytes 6 Not Estab. %   Eos 8 Not Estab. %   Basos 1 Not Estab. %   Neutrophils Absolute 5.0 1.4 - 7.0 x10E3/uL   Lymphocytes Absolute 1.5 0.7 - 3.1 x10E3/uL   Monocytes Absolute 0.5 0.1 - 0.9 x10E3/uL   EOS (ABSOLUTE) 0.6 (H) 0.0 - 0.4 x10E3/uL   Basophils Absolute 0.1 0.0 - 0.2 x10E3/uL   Immature Granulocytes 0 Not Estab. %   Immature Grans (Abs) 0.0 0.0 - 0.1 x10E3/uL  CMP14+EGFR   Collection Time: 11/19/23  9:46 AM  Result Value Ref Range   Glucose 98 70 - 99 mg/dL   BUN 13 8 - 27 mg/dL   Creatinine, Ser 2.95 0.76 - 1.27 mg/dL   eGFR 70 >62 ZH/YQM/5.78   BUN/Creatinine Ratio 11 10 - 24   Sodium 140 134 - 144 mmol/L   Potassium 5.3 (H) 3.5 - 5.2 mmol/L   Chloride 103 96 - 106 mmol/L   CO2 21 20 - 29 mmol/L   Calcium 10.0 8.6 - 10.2 mg/dL   Total Protein 7.3 6.0 - 8.5 g/dL   Albumin 4.5 3.9 - 4.9 g/dL   Globulin, Total 2.8 1.5 - 4.5 g/dL  Bilirubin Total 0.5 0.0 - 1.2 mg/dL   Alkaline Phosphatase 59 44 - 121 IU/L   AST 20 0 - 40 IU/L   ALT 13 0 - 44 IU/L    Assessment & Plan:   Problem List Items Addressed This Visit       Cardiovascular and Mediastinum   Hypertension   CAD, 90% LM, Total RCA, 80-90% CFX- S/P urgent CABG X 11 Jun 2012     Other   HLD (hyperlipidemia) - Primary    Potassium still borderline elevated but likely related to the [facility], its only 5.3 and normal is 5.2 so we will just monitor for now recommended hydration.  Patient has a MRI of his liver scheduled for today Follow up plan: Return in about 6 months (around 05/19/2024), or if symptoms worsen or fail to improve, for Hypertension and hyperlipidemia recheck and physical.  Counseling provided for all of the vaccine components No orders of the defined types were placed in this encounter.   Arville Care, MD [facility] 11/20/2023, 8:22 AM

## 2023-12-02 ENCOUNTER — Ambulatory Visit: Payer: Self-pay | Admitting: Family Medicine

## 2023-12-02 NOTE — Telephone Encounter (Signed)
 Patient aware and verbalized understanding not back yet

## 2023-12-02 NOTE — Telephone Encounter (Signed)
 Copied from CRM [id number]. Topic: Clinical - Lab/Test Results >> Dec 02, 2023  8:50 AM Turkey B wrote: Pt called in for lab results Patient called for MRI results; PCP office updated and to call patient back.  Reason for Disposition  Nursing judgment or information in reference  Answer Assessment - Initial Assessment Questions 1. REASON FOR CALL: "What is your main concern right now?"     Wants imaging results.  Protocols used: No Guideline Available-A-AH

## 2023-12-05 ENCOUNTER — Encounter: Payer: Self-pay | Admitting: Family Medicine

## 2024-04-11 ENCOUNTER — Other Ambulatory Visit: Payer: Self-pay | Admitting: Cardiovascular Disease

## 2024-04-13 ENCOUNTER — Encounter: Payer: Self-pay | Admitting: Cardiovascular Disease

## 2024-04-13 ENCOUNTER — Ambulatory Visit: Payer: Self-pay | Attending: Cardiovascular Disease | Admitting: Cardiovascular Disease

## 2024-04-13 VITALS — BP 121/72 | HR 66 | Ht 70.0 in | Wt 148.4 lb

## 2024-04-13 DIAGNOSIS — I10 Essential (primary) hypertension: Secondary | ICD-10-CM | POA: Diagnosis not present

## 2024-04-13 DIAGNOSIS — I701 Atherosclerosis of renal artery: Secondary | ICD-10-CM | POA: Diagnosis not present

## 2024-04-13 DIAGNOSIS — I2510 Atherosclerotic heart disease of native coronary artery without angina pectoris: Secondary | ICD-10-CM | POA: Diagnosis not present

## 2024-04-13 DIAGNOSIS — I739 Peripheral vascular disease, unspecified: Secondary | ICD-10-CM

## 2024-04-13 DIAGNOSIS — I2583 Coronary atherosclerosis due to lipid rich plaque: Secondary | ICD-10-CM

## 2024-04-13 DIAGNOSIS — E782 Mixed hyperlipidemia: Secondary | ICD-10-CM | POA: Diagnosis not present

## 2024-04-13 DIAGNOSIS — I6522 Occlusion and stenosis of left carotid artery: Secondary | ICD-10-CM

## 2024-04-13 DIAGNOSIS — Z95810 Presence of automatic (implantable) cardiac defibrillator: Secondary | ICD-10-CM | POA: Diagnosis not present

## 2024-04-13 DIAGNOSIS — Z720 Tobacco use: Secondary | ICD-10-CM

## 2024-04-13 DIAGNOSIS — I447 Left bundle-branch block, unspecified: Secondary | ICD-10-CM | POA: Diagnosis not present

## 2024-04-13 DIAGNOSIS — I255 Ischemic cardiomyopathy: Secondary | ICD-10-CM

## 2024-04-13 MED ORDER — CLOPIDOGREL BISULFATE 75 MG PO TABS: 75.0000 mg | ORAL_TABLET | Freq: Every day | ORAL | 3 refills | Status: AC

## 2024-04-13 MED ORDER — LISINOPRIL 20 MG PO TABS: 20.0000 mg | ORAL_TABLET | Freq: Every day | ORAL | 3 refills | Status: AC

## 2024-04-13 NOTE — Assessment & Plan Note (Signed)
 Chronic

## 2024-04-13 NOTE — Assessment & Plan Note (Signed)
 History of CAD status post cardiac arrest with urgent cath by myself September 2013 revealing left main/three-vessel disease.  He ultimately underwent CABG x 4 by Dr. Obadiah with a LIMA to his LAD, vein to the PDA and sequential vein to OM1 and OM 2.  He has remained asymptomatic.

## 2024-04-13 NOTE — Assessment & Plan Note (Signed)
 History of prior ICD implantation which was ultimately explanted because of a pocket infection.  He had a subcutaneous ICD placed by Dr. Waddell.

## 2024-04-13 NOTE — Assessment & Plan Note (Signed)
 History of carotid artery disease status post carotid stenting by myself under the Sapphire protocol for high risk asymptomatic patients 10/14/2012 with excellent result.  His most recent carotid Dopplers performed 04/09/2023 revealed a patent stent with no evidence of internal carotid artery stenosis.

## 2024-04-13 NOTE — Assessment & Plan Note (Signed)
 Ongoing tobacco abuse of 1 pack a day recalcitrant to risk factor modification.

## 2024-04-13 NOTE — Assessment & Plan Note (Signed)
 History of hyperlipidemia on statin therapy in addition to Zetia  and fenofibrate  with lipid profile performed 11/19/2023 revealing total cholesterol 118, LDL 62 and HDL 26.

## 2024-04-13 NOTE — Assessment & Plan Note (Signed)
 History of essential hypertension with blood pressure measured today at 121/72.  He is on lisinopril  and carvedilol .

## 2024-04-13 NOTE — Assessment & Plan Note (Signed)
 History of ischemic cardiomyopathy with echo performed 09/03/2023 revealing EF of 35 to 40% with mild dilatation of his LV and grade 2 diastolic dysfunction.  There was no significant valvular abnormalities.  He was on Entresto  which she did not tolerate and currently is on lisinopril  and carvedilol .  Does have some shortness of breath probably from COPD.

## 2024-04-13 NOTE — Progress Notes (Signed)
 04/13/2024 Jonathon Ryan   [birth year]  [id number]  Primary Physician Ryan, Jonathon LABOR, MD Primary Cardiologist: Jonathon JINNY Lesches MD [facility], [place], [place]  HPI:  Jonathon Ryan is a 68 y.o.    thin-appearing, married Caucasian father of 2 stepchildren, grandfather to 6 grandchildren, who I last saw in the office 02/12/2023.  He had witnessed cardiac arrest with bystander CPR and defibrillation, arctic sun who presented for emergent cardiac catheterization by myself revealing left main 3-vessel disease and he ultimately underwent emergency coronary artery bypass grafting by Dr. Maude Salinas Ryan with LIMA to his LAD, vein to a PDA, sequential vein to OM1 and 2. His EF was 25% to 30%. He made a full neurologic recovery. He is wearing a LifeVest. His other problems include hypertension and hyperlipidemia and discontinued tobacco abuse which he stopped June 21, 2012. He also has peripheral vascular occlusive disease with high-grade left internal carotid artery stenosis followed by duplex ultrasound, high-grade right renal artery stenosis and right iliac stenosis though he denies claudication. We have gotten Dopplers on both his renals and his lower extremities. He had a Myoview stress test that showed an inferior scar without ischemia with an EF in the 25%-30% range confirmed by 2D echo. I performed carotid stenting on him under the sapphire protocol for high risk asymptomatic patients on October 14, 2012, with an excellent result. Followup Dopplers of his carotidis performed on October 28, 2012, were normal. Dr. Lynwood Ryan performed implantation of a BiV ICD on November 25, 2012, which has resulted in marked improvement in him clinically with regards to exercise tolerance. We continue to follow his carotid and renal Dopplers which have remained stable. His last carotid, renal and lower actually a partial Doppler studies performed in the last one 2 years have been stable. He denies claudication. He  did unfortunately suffer a pocket infection of his ICD requiring explant. He also had a subcutaneous ICD placed as followed by Dr. Waddell.   I saw him a year ago he is remained stable.  He continues to not deny chest pain or shortness of breath.   Since I saw him a year ago he has remained stable.  He does get some dyspnea probably related to his COPD but denies chest pain.  His most recent 2D echo performed 09/03/2023 revealed an EF of 35 to 40% with grade 2 diastolic dysfunction and no significant valvular abnormality unchanged from his prior echocardiograms.  His renal Dopplers were reveal a patent right renal artery stenosis and carotid Dopplers revealed a patent left ICA stent.  He did stop his Entresto  because of side effects and went back on his lisinopril .   Current Meds  Medication Sig   aspirin  81 MG tablet Take 1 tablet (81 mg total) by mouth daily.   carvedilol  (COREG ) 25 MG tablet TAKE 1 TABLET BY MOUTH TWICE DAILY WITH A MEAL   ezetimibe  (ZETIA ) 10 MG tablet Take 1 tablet (10 mg total) by mouth daily.   fenofibrate  (TRICOR ) 145 MG tablet Take 1 tablet (145 mg total) by mouth daily.   pantoprazole  (PROTONIX ) 40 MG tablet Take 1 tablet (40 mg total) by mouth daily.   rosuvastatin  (CRESTOR ) 40 MG tablet Take 1 tablet (40 mg total) by mouth daily.   [DISCONTINUED] clopidogrel  (PLAVIX ) 75 MG tablet Take 1 tablet by mouth once daily   [DISCONTINUED] lisinopril  (ZESTRIL ) 20 MG tablet Take 20 mg by mouth daily.     No Known Allergies  Social History   Socioeconomic History   Marital status: Married    Spouse name: Roselind   Number of children: 2   Years of education: 9   Highest education level: 10th grade  Occupational History   Occupation: disability  Tobacco Use   Smoking status: Every Day    Current packs/day: 1.00    Average packs/day: 1 pack/day for 40.0 years (40.0 ttl pk-yrs)    Types: Cigarettes   Smokeless tobacco: Never   Tobacco comments:    Quit 2013, started  back; now 1 ppd  Vaping Use   Vaping status: Never Used  Substance and Sexual Activity   Alcohol use: Yes    Alcohol/week: 14.0 standard drinks of alcohol    Types: 14 Cans of beer per week    Comment: couple of drinks most days   Drug use: Yes    Frequency: 2.0 times per week    Types: Marijuana    Comment: 2 per week   Sexual activity: Yes    Comment: married and 2 step-daughters  Other Topics Concern   Not on file  Social History Narrative   Lives in one level home with his wife.   Bought an older home to remodel as a project since he retired - this keeps him busy and active   Social Drivers of Corporate investment banker Strain: Low Risk  (11/18/2023)   Overall Financial Resource Strain (CARDIA)    Difficulty of Paying Living Expenses: Not very hard  Food Insecurity: No Food Insecurity (11/18/2023)   Hunger Vital Sign    Worried About Running Out of Food in the Last Year: Never true    Ran Out of Food in the Last Year: Never true  Transportation Needs: No Transportation Needs (11/18/2023)   PRAPARE - Administrator, Civil Service (Medical): No    Lack of Transportation (Non-Medical): No  Physical Activity: Insufficiently Active (05/18/2023)   Exercise Vital Sign    Days of Exercise per Week: 2 days    Minutes of Exercise per Session: 10 min  Stress: No Stress Concern Present (05/18/2023)   [employer] of Occupational Health - Occupational Stress Questionnaire    Feeling of Stress : Not at all  Social Connections: Moderately Isolated (05/18/2023)   Social Connection and Isolation Panel    Frequency of Communication with Friends and Family: Twice a week    Frequency of Social Gatherings with Friends and Family: Once a week    Attends Religious Services: Never    Database administrator or Organizations: No    Attends Engineer, structural: Not on file    Marital Status: Married  Catering manager Violence: Not At Risk (09/19/2022)   Humiliation,  Afraid, Rape, and Kick questionnaire    Fear of Current or Ex-Partner: No    Emotionally Abused: No    Physically Abused: No    Sexually Abused: No     Review of Systems: General: negative for chills, fever, night sweats or weight changes.  Cardiovascular: negative for chest pain, dyspnea on exertion, edema, orthopnea, palpitations, paroxysmal nocturnal dyspnea or shortness of breath Dermatological: negative for rash Respiratory: negative for cough or wheezing Urologic: negative for hematuria Abdominal: negative for nausea, vomiting, diarrhea, bright red blood per rectum, melena, or hematemesis Neurologic: negative for visual changes, syncope, or dizziness All other systems reviewed and are otherwise negative except as noted above.    Blood pressure 121/72, pulse 66, height 5' 10 (1.778 m),  weight 148 lb 6.4 oz (67.3 kg), SpO2 98%.  General appearance: alert and no distress Neck: no adenopathy, no carotid bruit, no JVD, supple, symmetrical, trachea midline, and thyroid not enlarged, symmetric, no tenderness/mass/nodules Lungs: clear to auscultation bilaterally Heart: regular rate and rhythm, S1, S2 normal, no murmur, click, rub or gallop Extremities: extremities normal, atraumatic, no cyanosis or edema Pulses: 2+ and symmetric Skin: Skin color, texture, turgor normal. No rashes or lesions Neurologic: Grossly normal  EKG EKG Interpretation Date/Time:  Monday April 13 2024 11:35:09 EDT Ventricular Rate:  66 PR Interval:  186 QRS Duration:  158 QT Interval:  464 QTC Calculation: 486 R Axis:   14  Text Interpretation: Normal sinus rhythm Left bundle branch block When compared with ECG of 14-Jul-2020 05:36, T wave inversion less evident in Lateral leads Confirmed by Court Carrier [phone]) on 04/13/2024 11:47:29 AM    ASSESSMENT AND PLAN:   Tobacco abuse Ongoing tobacco abuse of 1 pack a day recalcitrant to risk factor modification.  HLD (hyperlipidemia) History of  hyperlipidemia on statin therapy in addition to Zetia  and fenofibrate  with lipid profile performed 11/19/2023 revealing total cholesterol 118, LDL 62 and HDL 26.  Ischemic cardiomyopathy History of ischemic cardiomyopathy with echo performed 09/03/2023 revealing EF of 35 to 40% with mild dilatation of his LV and grade 2 diastolic dysfunction.  There was no significant valvular abnormalities.  He was on Entresto  which she did not tolerate and currently is on lisinopril  and carvedilol .  Does have some shortness of breath probably from COPD.  PVD, 90% Rt RAS, 80% Rt Iliac at cath 90% right renal artery stenosis the cath as well as 80% right iliac stenosis.  He denies claudication.  Renal Dopplers have remained stable performed 04/09/2023 with a right renal aortic ratio of 4.78 and left of 2.11.  Renal dimensions have remained stable as well.  Blood pressure has been under good control.  CAD, 90% LM, Total RCA, 80-90% CFX- S/P urgent CABG X 11 Jun 2012 History of CAD status post cardiac arrest with urgent cath by myself September 2013 revealing left main/three-vessel disease.  He ultimately underwent CABG x 4 by Dr. Obadiah with a LIMA to his LAD, vein to the PDA and sequential vein to OM1 and OM 2.  He has remained asymptomatic.  Carotid artery stenosis, LICA stent 10/14/12 History of carotid artery disease status post carotid stenting by myself under the Sapphire protocol for high risk asymptomatic patients 10/14/2012 with excellent result.  His most recent carotid Dopplers performed 04/09/2023 revealed a patent stent with no evidence of internal carotid artery stenosis.  LBBB (left bundle branch block) Chronic  Hypertension History of essential hypertension with blood pressure measured today at 121/72.  He is on lisinopril  and carvedilol .  Automatic implantable cardioverter-defibrillator in situ History of prior ICD implantation which was ultimately explanted because of a pocket infection.  He had a  subcutaneous ICD placed by Dr. Waddell.     Carrier DOROTHA Court MD FACP,[name],[name], [facility] 04/13/2024 11:58 AM

## 2024-04-13 NOTE — Assessment & Plan Note (Signed)
 90% right renal artery stenosis the cath as well as 80% right iliac stenosis.  He denies claudication.  Renal Dopplers have remained stable performed 04/09/2023 with a right renal aortic ratio of 4.78 and left of 2.11.  Renal dimensions have remained stable as well.  Blood pressure has been under good control.

## 2024-04-13 NOTE — Patient Instructions (Signed)
 Medication Instructions:  Your physician recommends that you continue on your current medications as directed. Please refer to the Current Medication list given to you today.  *If you need a refill on your cardiac medications before your next appointment, please call your pharmacy*   Testing/Procedures: Your physician has requested that you have a carotid duplex. This test is an ultrasound of the carotid arteries in your neck. It looks at blood flow through these arteries that supply the brain with blood. Allow one hour for this exam. There are no restrictions or special instructions. This will take place at [address], 4th floor  Please note: We ask at that you not bring children with you during ultrasound (echo/ vascular) testing. Due to room size and safety concerns, children are not allowed in the ultrasound rooms during exams. Our front office staff cannot provide observation of children in our lobby area while testing is being conducted. An adult accompanying a patient to their appointment will only be allowed in the ultrasound room at the discretion of the ultrasound technician under special circumstances. We apologize for any inconvenience.   Your physician has requested that you have a renal artery duplex. During this test, an ultrasound is used to evaluate blood flow to the kidneys. Take your medications as you usually do. This will take place at [address] 4th floor.  No food after 11PM the night before.  Water is OK. (Don't drink liquids if you have been instructed not to for ANOTHER test). Avoid foods that produce bowel gas, for 24 hours prior to exam (see below). No breakfast, no chewing gum, no smoking or carbonated beverages. Patient may take morning medications with water. Come in for test at least 15 minutes early to register.  Please note: We ask at that you not bring children with you during ultrasound (echo/ vascular) testing. Due to room size and safety  concerns, children are not allowed in the ultrasound rooms during exams. Our front office staff cannot provide observation of children in our lobby area while testing is being conducted. An adult accompanying a patient to their appointment will only be allowed in the ultrasound room at the discretion of the ultrasound technician under special circumstances. We apologize for any inconvenience.   Follow-Up: At [facility], you and your health needs are our priority.  As part of our continuing mission to provide you with exceptional heart care, our providers are all part of one team.  This team includes your primary Cardiologist (physician) and Advanced Practice Providers or APPs (Physician Assistants and Nurse Practitioners) who all work together to provide you with the care you need, when you need it.  Your next appointment:   12 month(s)  Provider:   Dorn Lesches, MD    We recommend signing up for the patient portal called MyChart.  Sign up information is provided on this After Visit Summary.  MyChart is used to connect with patients for Virtual Visits (Telemedicine).  Patients are able to view lab/test results, encounter notes, upcoming appointments, etc.  Non-urgent messages can be sent to your provider as well.   To learn more about what you can do with MyChart, go to [email or web address].

## 2024-05-06 ENCOUNTER — Other Ambulatory Visit: Payer: Self-pay | Admitting: Family Medicine

## 2024-05-06 DIAGNOSIS — K219 Gastro-esophageal reflux disease without esophagitis: Secondary | ICD-10-CM

## 2024-05-19 ENCOUNTER — Ambulatory Visit (HOSPITAL_COMMUNITY)
Admission: RE | Admit: 2024-05-19 | Discharge: 2024-05-19 | Disposition: A | Discharge status: Home / Self Care | Source: Ambulatory Visit | Attending: Cardiovascular Disease | Admitting: Cardiovascular Disease

## 2024-05-19 ENCOUNTER — Ambulatory Visit (HOSPITAL_BASED_OUTPATIENT_CLINIC_OR_DEPARTMENT_OTHER)
Admission: RE | Admit: 2024-05-19 | Discharge: 2024-05-19 | Disposition: A | Discharge status: Home / Self Care | Source: Ambulatory Visit | Attending: Cardiovascular Disease | Admitting: Cardiovascular Disease

## 2024-05-19 ENCOUNTER — Ambulatory Visit: Payer: Self-pay | Admitting: Cardiovascular Disease

## 2024-05-19 DIAGNOSIS — I6522 Occlusion and stenosis of left carotid artery: Secondary | ICD-10-CM | POA: Diagnosis not present

## 2024-05-19 DIAGNOSIS — I701 Atherosclerosis of renal artery: Secondary | ICD-10-CM

## 2024-05-19 DIAGNOSIS — I6523 Occlusion and stenosis of bilateral carotid arteries: Secondary | ICD-10-CM

## 2024-05-20 ENCOUNTER — Ambulatory Visit: Payer: Medicare HMO | Admitting: Family Medicine

## 2024-05-20 ENCOUNTER — Other Ambulatory Visit: Payer: Self-pay

## 2024-05-20 ENCOUNTER — Encounter: Payer: Self-pay | Admitting: Family Medicine

## 2024-05-20 VITALS — BP 132/69 | HR 54 | Ht 70.0 in | Wt 148.0 lb

## 2024-05-20 DIAGNOSIS — F17210 Nicotine dependence, cigarettes, uncomplicated: Secondary | ICD-10-CM

## 2024-05-20 DIAGNOSIS — I10 Essential (primary) hypertension: Secondary | ICD-10-CM

## 2024-05-20 DIAGNOSIS — I2583 Coronary atherosclerosis due to lipid rich plaque: Secondary | ICD-10-CM | POA: Diagnosis not present

## 2024-05-20 DIAGNOSIS — K219 Gastro-esophageal reflux disease without esophagitis: Secondary | ICD-10-CM | POA: Diagnosis not present

## 2024-05-20 DIAGNOSIS — E782 Mixed hyperlipidemia: Secondary | ICD-10-CM

## 2024-05-20 DIAGNOSIS — I2510 Atherosclerotic heart disease of native coronary artery without angina pectoris: Secondary | ICD-10-CM

## 2024-05-20 DIAGNOSIS — Z125 Encounter for screening for malignant neoplasm of prostate: Secondary | ICD-10-CM

## 2024-05-20 MED ORDER — ROSUVASTATIN CALCIUM 40 MG PO TABS: 40.0000 mg | ORAL_TABLET | Freq: Every day | ORAL | 3 refills | Status: AC

## 2024-05-20 MED ORDER — EZETIMIBE 10 MG PO TABS: 10.0000 mg | ORAL_TABLET | Freq: Every day | ORAL | 3 refills | Status: AC

## 2024-05-20 MED ORDER — FENOFIBRATE 145 MG PO TABS
145.0000 mg | ORAL_TABLET | Freq: Every day | ORAL | 3 refills | Status: AC
Start: 2024-05-20 — End: ?

## 2024-05-20 MED ORDER — PANTOPRAZOLE SODIUM 40 MG PO TBEC
40.0000 mg | DELAYED_RELEASE_TABLET | Freq: Every day | ORAL | 3 refills | Status: AC
Start: 2024-05-20 — End: ?

## 2024-05-20 NOTE — Progress Notes (Signed)
 BP 132/69   Pulse (!) 54   Ht 5' 10 (1.778 m)   Wt 148 lb (67.1 kg)   SpO2 98%   BMI 21.24 kg/m    Subjective:   Patient ID: VEASNA SANTIBANEZ, male    DOB: [birth year], 68 y.o.   MRN: [id number]  HPI: ANDREI MCCOOK is a 68 y.o. male presenting on 05/20/2024 for Medical Management of Chronic Issues, Hyperlipidemia, and Hypertension   Discussed the use of AI scribe software for clinical note transcription with the patient, who gave verbal consent to proceed.  History of Present Illness   LINFORD QUINTELA is a 68 year old male who presents for a six-month follow-up visit.  He is currently on rosuvastatin , fenofibrate , and Zetia  daily for cholesterol management without any issues or complaints. He recently underwent a renal and carotid scan, but the results are pending.  For hypertension, he takes lisinopril  and carvedilol . His home blood pressure readings initially start high but decrease to normal levels with repeated measurements. In July, he discussed his blood pressure management with his cardiologist, leading to the discontinuation of Entresto  and Farxiga  and a return to lisinopril .  He continues to take aspirin  and Plavix  to maintain stent and artery patency. He has a history of a lung scan in November 2024, which showed a small lump suspected to be scar tissue from smoking.  He smokes and is considering quitting, working on it independently.          Relevant past medical, surgical, family and social history reviewed and updated as indicated. Interim medical history since our last visit reviewed. Allergies and medications reviewed and updated.  Review of Systems  Constitutional:  Negative for chills and fever.  Eyes:  Negative for visual disturbance.  Respiratory:  Negative for shortness of breath and wheezing.   Cardiovascular:  Negative for chest pain and leg swelling.  Musculoskeletal:  Negative for back pain and gait problem.  Skin:  Negative for rash.  All other systems  reviewed and are negative.   Per HPI unless specifically indicated above   Allergies as of 05/20/2024   No Known Allergies      Medication List        Accurate as of May 20, 2024  9:51 AM. If you have any questions, ask your nurse or doctor.          aspirin  81 MG tablet Take 1 tablet (81 mg total) by mouth daily.   carvedilol  25 MG tablet Commonly known as: COREG  TAKE 1 TABLET BY MOUTH TWICE DAILY WITH A MEAL . APPOINTMENT REQUIRED FOR FUTURE REFILLS   clopidogrel  75 MG tablet Commonly known as: PLAVIX  Take 1 tablet (75 mg total) by mouth daily.   ezetimibe  10 MG tablet Commonly known as: ZETIA  Take 1 tablet (10 mg total) by mouth daily.   fenofibrate  145 MG tablet Commonly known as: TRICOR  Take 1 tablet (145 mg total) by mouth daily.   lisinopril  20 MG tablet Commonly known as: ZESTRIL  Take 1 tablet (20 mg total) by mouth daily.   pantoprazole  40 MG tablet Commonly known as: PROTONIX  Take 1 tablet (40 mg total) by mouth daily.   rosuvastatin  40 MG tablet Commonly known as: CRESTOR  Take 1 tablet (40 mg total) by mouth daily.         Objective:   BP 132/69   Pulse (!) 54   Ht 5' 10 (1.778 m)   Wt 148 lb (67.1 kg)   SpO2 98%  BMI 21.24 kg/m   Wt Readings from Last 3 Encounters:  05/20/24 148 lb (67.1 kg)  04/13/24 148 lb 6.4 oz (67.3 kg)  11/20/23 149 lb (67.6 kg)    Physical Exam Physical Exam   VITALS: BP- 132/69 CHEST: Lungs clear to auscultation bilaterally. CARDIOVASCULAR: Heart regular rate and rhythm, no murmurs. Peripheral pulses intact. ABDOMEN: No costovertebral angle tenderness. EXTREMITIES: No edema in lower extremities.         Assessment & Plan:   Problem List Items Addressed This Visit       Cardiovascular and Mediastinum   Hypertension   Relevant Medications   ezetimibe  (ZETIA ) 10 MG tablet   fenofibrate  (TRICOR ) 145 MG tablet   rosuvastatin  (CRESTOR ) 40 MG tablet   CAD, 90% LM, Total RCA, 80-90% CFX- S/P  urgent CABG X 11 Jun 2012   Relevant Medications   ezetimibe  (ZETIA ) 10 MG tablet   fenofibrate  (TRICOR ) 145 MG tablet   rosuvastatin  (CRESTOR ) 40 MG tablet     Digestive   GERD (gastroesophageal reflux disease)   Relevant Medications   pantoprazole  (PROTONIX ) 40 MG tablet     Other   HLD (hyperlipidemia) - Primary   Relevant Medications   ezetimibe  (ZETIA ) 10 MG tablet   fenofibrate  (TRICOR ) 145 MG tablet   rosuvastatin  (CRESTOR ) 40 MG tablet   Other Visit Diagnoses       Smokes with greater than 30 pack year history       Relevant Orders   Ambulatory Referral for Lung Cancer Scre          Coronary artery disease, status post stents Managed with aspirin  and Plavix  for stent patency. Cardiologist involved. Awaiting carotid scan results. - Continue aspirin  and Plavix . - Monitor carotid scan results.  Hypertension Blood pressure stable at 132/69 mmHg. Home readings decrease with repeated measurements. Cardiologist satisfied with current management. - Continue lisinopril  and carvedilol . - Monitor blood pressure.  Hyperlipidemia Managed with rosuvastatin , fenofibrate , and Zetia . Cholesterol levels to be checked today. Cardiologist monitors for blockages. - Check cholesterol levels.  Tobacco use disorder Continues smoking, considering quitting, open to assistance. Smoking may contribute to pulmonary scarring. - Offer support and resources for smoking cessation.  Pulmonary nodule, under surveillance Pulmonary nodule unchanged, likely scar tissue. Annual surveillance with lung scans. - Schedule lung scan in November to monitor pulmonary nodule.       Follow up plan: Return in about 6 months (around 11/20/2024), or if symptoms worsen or fail to improve, for Hypertension and hyperlipidemia.  Counseling provided for all of the vaccine components Orders Placed This Encounter  Procedures   Ambulatory Referral for Lung Cancer Scre    Fonda Levins, MD [facility] 05/20/2024, 9:51 AM

## 2024-05-21 LAB — CMP14+EGFR
ALT: 12 IU/L (ref 0–44)
AST: 19 IU/L (ref 0–40)
Albumin: 4.8 g/dL (ref 3.9–4.9)
Alkaline Phosphatase: 55 IU/L (ref 44–121)
BUN/Creatinine Ratio: 13 (ref 10–24)
BUN: 15 mg/dL (ref 8–27)
Bilirubin Total: 0.5 mg/dL (ref 0.0–1.2)
CO2: 23 mmol/L (ref 20–29)
Calcium: 10.8 mg/dL — ABNORMAL HIGH (ref 8.6–10.2)
Chloride: 100 mmol/L (ref 96–106)
Creatinine, Ser: 1.15 mg/dL (ref 0.76–1.27)
Globulin, Total: 2.9 g/dL (ref 1.5–4.5)
Glucose: 94 mg/dL (ref 70–99)
Potassium: 5.6 mmol/L — ABNORMAL HIGH (ref 3.5–5.2)
Sodium: 138 mmol/L (ref 134–144)
Total Protein: 7.7 g/dL (ref 6.0–8.5)
eGFR: 69 mL/min/1.73 (ref >59)

## 2024-05-21 LAB — PSA, TOTAL AND FREE
PSA, Free Pct: 32.5 %
PSA, Free: 0.39 ng/mL
Prostate Specific Ag, Serum: 1.2 ng/mL (ref 0.0–4.0)

## 2024-05-21 LAB — CBC WITH DIFFERENTIAL/PLATELET
Basophils Absolute: 0.1 x10E3/uL (ref 0.0–0.2)
Basos: 1 %
EOS (ABSOLUTE): 0.6 x10E3/uL — ABNORMAL HIGH (ref 0.0–0.4)
Eos: 6 %
Hematocrit: 44.6 % (ref 37.5–51.0)
Hemoglobin: 14.1 g/dL (ref 13.0–17.7)
Immature Grans (Abs): 0 x10E3/uL (ref 0.0–0.1)
Immature Granulocytes: 0 %
Lymphocytes Absolute: 1.7 x10E3/uL (ref 0.7–3.1)
Lymphs: 16 %
MCH: 28.6 pg (ref 26.6–33.0)
MCHC: 31.6 g/dL (ref 31.5–35.7)
MCV: 91 fL (ref 79–97)
Monocytes Absolute: 0.6 x10E3/uL (ref 0.1–0.9)
Monocytes: 6 %
Neutrophils Absolute: 7.7 x10E3/uL — ABNORMAL HIGH (ref 1.4–7.0)
Neutrophils: 71 %
Platelets: 285 x10E3/uL (ref 150–450)
RBC: 4.93 x10E6/uL (ref 4.14–5.80)
RDW: 13.8 % (ref 11.6–15.4)
WBC: 10.7 x10E3/uL (ref 3.4–10.8)

## 2024-05-21 LAB — LIPID PANEL
Chol/HDL Ratio: 4.9 ratio (ref 0.0–5.0)
Cholesterol, Total: 142 mg/dL (ref 100–199)
HDL: 29 mg/dL — ABNORMAL LOW (ref >39)
LDL Chol Calc (NIH): 82 mg/dL (ref 0–99)
Triglycerides: 180 mg/dL — ABNORMAL HIGH (ref 0–149)
VLDL Cholesterol Cal: 31 mg/dL (ref 5–40)

## 2024-05-27 ENCOUNTER — Ambulatory Visit: Payer: Self-pay | Admitting: Family Medicine

## 2024-05-27 DIAGNOSIS — E875 Hyperkalemia: Secondary | ICD-10-CM

## 2024-05-27 NOTE — Telephone Encounter (Signed)
 Patient aware and verbalized understanding. Follow up labs ordered and lab appointment scheduled.

## 2024-06-01 ENCOUNTER — Other Ambulatory Visit

## 2024-06-01 DIAGNOSIS — E875 Hyperkalemia: Secondary | ICD-10-CM | POA: Diagnosis not present

## 2024-06-01 LAB — BMP8+EGFR
BUN/Creatinine Ratio: 16 (ref 10–24)
BUN: 18 mg/dL (ref 8–27)
CO2: 19 mmol/L — ABNORMAL LOW (ref 20–29)
Calcium: 10.9 mg/dL — ABNORMAL HIGH (ref 8.6–10.2)
Chloride: 100 mmol/L (ref 96–106)
Creatinine, Ser: 1.11 mg/dL (ref 0.76–1.27)
Glucose: 93 mg/dL (ref 70–99)
Potassium: 5.4 mmol/L — ABNORMAL HIGH (ref 3.5–5.2)
Sodium: 137 mmol/L (ref 134–144)
eGFR: 72 mL/min/1.73 (ref >59)

## 2024-06-05 ENCOUNTER — Ambulatory Visit: Payer: Self-pay | Admitting: Family Medicine

## 2024-06-23 ENCOUNTER — Ambulatory Visit

## 2024-09-10 ENCOUNTER — Ambulatory Visit (HOSPITAL_COMMUNITY)
Admission: RE | Admit: 2024-09-10 | Discharge: 2024-09-10 | Disposition: A | Discharge status: Home / Self Care | Source: Ambulatory Visit | Attending: Internal Medicine | Admitting: Internal Medicine

## 2024-09-10 DIAGNOSIS — I10 Essential (primary) hypertension: Secondary | ICD-10-CM | POA: Diagnosis not present

## 2024-09-10 DIAGNOSIS — E782 Mixed hyperlipidemia: Secondary | ICD-10-CM | POA: Insufficient documentation

## 2024-09-10 DIAGNOSIS — I255 Ischemic cardiomyopathy: Secondary | ICD-10-CM | POA: Insufficient documentation

## 2024-09-10 LAB — ECHOCARDIOGRAM COMPLETE: S' Lateral: 5.49 cm

## 2024-09-11 ENCOUNTER — Ambulatory Visit: Payer: Self-pay | Admitting: Cardiovascular Disease

## 2024-09-11 DIAGNOSIS — I10 Essential (primary) hypertension: Secondary | ICD-10-CM

## 2024-09-11 DIAGNOSIS — I255 Ischemic cardiomyopathy: Secondary | ICD-10-CM

## 2024-09-11 DIAGNOSIS — E782 Mixed hyperlipidemia: Secondary | ICD-10-CM

## 2024-11-20 ENCOUNTER — Ambulatory Visit: Payer: Self-pay | Admitting: Family Medicine
